# Patient Record
Sex: Female | Born: 1970
Health system: Southern US, Community
[De-identification: ages and names within clinical notes are randomized; demographics above are authoritative.]

## PROBLEM LIST (undated history)

## (undated) DIAGNOSIS — E119 Type 2 diabetes mellitus without complications: Secondary | ICD-10-CM

## (undated) DIAGNOSIS — I10 Essential (primary) hypertension: Secondary | ICD-10-CM

## (undated) DIAGNOSIS — E7800 Pure hypercholesterolemia, unspecified: Secondary | ICD-10-CM

## (undated) HISTORY — DX: Type 2 diabetes mellitus without complications: E11.9

---

## 1997-08-01 ENCOUNTER — Emergency Department (HOSPITAL_COMMUNITY): Admission: EM | Admit: 1997-08-01 | Discharge: 1997-08-01 | Payer: Self-pay | Admitting: Emergency Medicine

## 1999-01-08 ENCOUNTER — Emergency Department (HOSPITAL_COMMUNITY): Admission: EM | Admit: 1999-01-08 | Discharge: 1999-01-08 | Payer: Self-pay | Admitting: Emergency Medicine

## 2000-04-17 ENCOUNTER — Encounter: Payer: Self-pay | Admitting: Emergency Medicine

## 2000-04-17 ENCOUNTER — Emergency Department (HOSPITAL_COMMUNITY): Admission: EM | Admit: 2000-04-17 | Discharge: 2000-04-17 | Payer: Self-pay | Admitting: Emergency Medicine

## 2000-07-10 ENCOUNTER — Emergency Department (HOSPITAL_COMMUNITY): Admission: EM | Admit: 2000-07-10 | Discharge: 2000-07-10 | Payer: Self-pay | Admitting: Emergency Medicine

## 2000-07-10 ENCOUNTER — Encounter: Payer: Self-pay | Admitting: Emergency Medicine

## 2000-07-29 ENCOUNTER — Emergency Department (HOSPITAL_COMMUNITY): Admission: EM | Admit: 2000-07-29 | Discharge: 2000-07-29 | Payer: Self-pay | Admitting: Emergency Medicine

## 2002-01-02 ENCOUNTER — Encounter: Payer: Self-pay | Admitting: Emergency Medicine

## 2002-01-02 ENCOUNTER — Emergency Department (HOSPITAL_COMMUNITY): Admission: EM | Admit: 2002-01-02 | Discharge: 2002-01-02 | Payer: Self-pay | Admitting: Emergency Medicine

## 2002-01-10 ENCOUNTER — Emergency Department (HOSPITAL_COMMUNITY): Admission: EM | Admit: 2002-01-10 | Discharge: 2002-01-11 | Payer: Self-pay | Admitting: Emergency Medicine

## 2002-10-08 ENCOUNTER — Emergency Department (HOSPITAL_COMMUNITY): Admission: EM | Admit: 2002-10-08 | Discharge: 2002-10-08 | Payer: Self-pay | Admitting: *Deleted

## 2002-12-25 ENCOUNTER — Emergency Department (HOSPITAL_COMMUNITY): Admission: AD | Admit: 2002-12-25 | Discharge: 2002-12-25 | Payer: Self-pay | Admitting: Family Medicine

## 2011-01-05 ENCOUNTER — Emergency Department (HOSPITAL_COMMUNITY)
Admission: EM | Admit: 2011-01-05 | Discharge: 2011-01-05 | Disposition: A | Discharge status: Home / Self Care | Payer: Self-pay | Attending: Emergency Medicine | Admitting: Emergency Medicine

## 2011-01-05 ENCOUNTER — Encounter: Payer: Self-pay | Admitting: *Deleted

## 2011-01-05 DIAGNOSIS — F17200 Nicotine dependence, unspecified, uncomplicated: Secondary | ICD-10-CM | POA: Insufficient documentation

## 2011-01-05 DIAGNOSIS — IMO0002 Reserved for concepts with insufficient information to code with codable children: Secondary | ICD-10-CM | POA: Insufficient documentation

## 2011-01-05 DIAGNOSIS — L0291 Cutaneous abscess, unspecified: Secondary | ICD-10-CM

## 2011-01-05 MED ORDER — DOXYCYCLINE HYCLATE 100 MG PO CAPS: 100.0000 mg | ORAL_CAPSULE | Freq: Two times a day (BID) | ORAL | Status: AC

## 2011-01-05 MED ORDER — OXYCODONE-ACETAMINOPHEN 5-325 MG PO TABS
2.0000 | ORAL_TABLET | Freq: Once | ORAL | Status: AC
  Administered 2011-01-05: 2 via ORAL
  Filled 2011-01-05: qty 2

## 2011-01-05 MED ORDER — LIDOCAINE HCL 2 % IJ SOLN
INTRAMUSCULAR | Status: AC
  Administered 2011-01-05: 20 mg
  Filled 2011-01-05: qty 1

## 2011-01-05 MED ORDER — LIDOCAINE HCL (CARDIAC) 20 MG/ML IV SOLN: 0.5000 mg/kg | Freq: Once | INTRAVENOUS | Status: DC

## 2011-01-05 MED ORDER — OXYCODONE-ACETAMINOPHEN 5-325 MG PO TABS: 1.0000 | ORAL_TABLET | Freq: Four times a day (QID) | ORAL | Status: AC | PRN

## 2011-01-05 NOTE — Discharge Planning (Signed)
ED CM contacted by Triage RN to assist with indigent medications.  Confirmed pt eligible for indigent medication services with Mr Mendel Cor[facility] in [facility]. Rx for Doxycycline tubed to [facility] pharmacy for processing with request to return call to Triage RN when ready.  CM spoke with pt and female at bedside about annual indigent medication assistance, local self pay pcps for follow up and provided with written resources for [employer].  Answered questions about low cost medication at [facility].  Informed her [facility] indigent program does not provide assist with narcotics -Rx for percocet from PA.  Pt states she will also try Aleve or Tylenol for pain.  Referred her to [facility]. Pt requesting PA incise abscess under her arm. Informed PA of pt request.

## 2011-01-05 NOTE — ED Provider Notes (Signed)
Medical screening examination/treatment/procedure(s) were performed by non-physician practitioner and as supervising physician I was immediately available for consultation/collaboration.   Laray Anger, DO 01/05/11 2007

## 2011-01-05 NOTE — ED Notes (Signed)
Pt states "I have these knots under my arm that have been there for about a month, they hurt"; pt presents with abceses to left axilla

## 2011-01-05 NOTE — ED Provider Notes (Signed)
History     CSN: [id number] Arrival date & time: 01/05/2011 10:16 AM   First MD Initiated Contact with Patient 01/05/11 1130      Chief Complaint  Patient presents with  . Cyst    (Consider location/radiation/quality/duration/timing/severity/associated sxs/prior treatment) HPI   Pt presents to the Emergency Department with complaints of abscesses under her arms. She is requesting surgery to have it removed because her boyfriend was taken to surgery for an abscess he had on his leg. She states that the bumps have been under her arms for about a month and that a new one has popped up which is really painful.  History reviewed. No pertinent past medical history.  History reviewed. No pertinent past surgical history.  No family history on file.  History  Substance Use Topics  . Smoking status: Current Everyday Smoker -- 0.5 packs/day  . Smokeless tobacco: Not on file  . Alcohol Use: 3.6 oz/week    6 Cans of beer per week     daily    OB History    Grav Para Term Preterm Abortions TAB SAB Ect Mult Living                  Review of Systems  All other systems reviewed and are negative.    Allergies  Review of patient's allergies indicates no known allergies.  Home Medications  No current outpatient prescriptions on file.  BP 138/90  Pulse 93  Temp(Src) 98.6 F (37 C) (Oral)  Resp 18  Ht 4\' 11"  (1.499 m)  Wt 150 lb (68.04 kg)  BMI 30.30 kg/m2  SpO2 100%  LMP 12/09/2010  Physical Exam  Constitutional: She appears well-developed and well-nourished.  HENT:  Head: Normocephalic and atraumatic.  Eyes: Conjunctivae are normal. Pupils are equal, round, and reactive to light.  Neck: Trachea normal, normal range of motion and full passive range of motion without pain. Neck supple.  Cardiovascular: Normal rate, regular rhythm and normal pulses.   Pulmonary/Chest: Effort normal and breath sounds normal. Chest wall is not dull to percussion. She exhibits no  tenderness, no crepitus, no edema, no deformity and no retraction.  Abdominal: Normal appearance.  Musculoskeletal: Normal range of motion.  Neurological: She is alert. She has normal strength.  Skin: Skin is warm, dry and intact.     Psychiatric: She has a normal mood and affect. Her speech is normal and behavior is normal. Judgment and thought content normal. Cognition and memory are normal.    ED Course  INCISION AND DRAINAGE Date/Time: 01/05/2011 12:53 PM Performed by: Dorthula Matas Authorized by: Dorthula Matas Consent: Verbal consent obtained. Risks and benefits: risks, benefits and alternatives were discussed Consent given by: patient Patient understanding: patient states understanding of the procedure being performed Patient consent: the patient's understanding of the procedure matches consent given Type: abscess Local anesthetic: lidocaine 1% without epinephrine Anesthetic total: 3 ml Patient sedated: no   (including critical care time)  Labs Reviewed - No data to display No results found.   No diagnosis found.    MDM  I informed the patient that an I&D would be the correct method to treat her abscesses and she refused to let me incise it, she states that she wants surgery to have the lumps removed. I told her I could refer her to surgery if she would like, but that abscesses are not emergent, I offered IandD again, antibiotics and pain medication and pt refuses. She wants surgery and said taht  she will be going to [facility] instead.  Before discharge pt changed her mind and let me I&D        Dorthula Matas, PA 01/05/11 1142  Dorthula Matas, [place] 01/05/11 1253

## 2011-03-04 ENCOUNTER — Emergency Department (HOSPITAL_COMMUNITY)
Admission: EM | Admit: 2011-03-04 | Discharge: 2011-03-05 | Disposition: A | Discharge status: Home / Self Care | Payer: Self-pay | Attending: Emergency Medicine | Admitting: Emergency Medicine

## 2011-03-04 ENCOUNTER — Encounter (HOSPITAL_COMMUNITY): Payer: Self-pay | Admitting: Emergency Medicine

## 2011-03-04 DIAGNOSIS — L02412 Cutaneous abscess of left axilla: Secondary | ICD-10-CM

## 2011-03-04 DIAGNOSIS — L0390 Cellulitis, unspecified: Secondary | ICD-10-CM

## 2011-03-04 DIAGNOSIS — IMO0002 Reserved for concepts with insufficient information to code with codable children: Secondary | ICD-10-CM | POA: Insufficient documentation

## 2011-03-04 DIAGNOSIS — M7989 Other specified soft tissue disorders: Secondary | ICD-10-CM | POA: Insufficient documentation

## 2011-03-04 DIAGNOSIS — M79609 Pain in unspecified limb: Secondary | ICD-10-CM | POA: Insufficient documentation

## 2011-03-04 MED ORDER — OXYCODONE-ACETAMINOPHEN 5-325 MG PO TABS
2.0000 | ORAL_TABLET | Freq: Once | ORAL | Status: AC
  Administered 2011-03-04: 2 via ORAL
  Filled 2011-03-04: qty 2

## 2011-03-04 MED ORDER — HYDROCODONE-ACETAMINOPHEN 7.5-325 MG/15ML PO SOLN: 15.0000 mL | ORAL | Status: AC | PRN

## 2011-03-04 MED ORDER — MINOCYCLINE HCL 100 MG PO CAPS: 100.0000 mg | ORAL_CAPSULE | Freq: Two times a day (BID) | ORAL | Status: AC

## 2011-03-04 NOTE — ED Provider Notes (Signed)
Medical screening examination/treatment/procedure(s) were performed by non-physician practitioner and as supervising physician I was immediately available for consultation/collaboration.   Dayton Bailiff, MD 03/04/11 [phone]

## 2011-03-04 NOTE — ED Notes (Signed)
Pt c/o abscess under left arm.  St's area is draining.

## 2011-03-04 NOTE — ED Notes (Signed)
C/o abscess under L arm x 1 month.

## 2011-03-04 NOTE — ED Notes (Signed)
Area of redness marked with skin marker.

## 2011-03-04 NOTE — ED Provider Notes (Signed)
History     CSN: [id number]  Arrival date & time 03/04/11  8657   First MD Initiated Contact with Patient 03/04/11 2017      Chief Complaint  Patient presents with  . Abscess     Patient is a 41 y.o. female presenting with abscess. The history is provided by the patient.  Abscess  This is a recurrent problem. The current episode started more than one week ago. The onset was gradual. The problem has been gradually worsening. The problem is moderate. The abscess is characterized by redness, painfulness, draining and swelling. The abscess first occurred at home. Pertinent negatives include no fever. She has received no recent medical care.  Patient reports approximately one month history of abscess to left axilla. States that she has been trying to self treat at home and noted within the last 24 hours at the abscess has actually started to drain. States abscess and surrounding area very painful and tender to palpation. Has history of abscesses in the same location.  History reviewed. No pertinent past medical history.  History reviewed. No pertinent past surgical history.  No family history on file.  History  Substance Use Topics  . Smoking status: Current Everyday Smoker -- 0.5 packs/day  . Smokeless tobacco: Not on file  . Alcohol Use: 3.6 oz/week    6 Cans of beer per week     daily    OB History    Grav Para Term Preterm Abortions TAB SAB Ect Mult Living                  Review of Systems  Constitutional: Negative.  Negative for fever.  HENT: Negative.   Eyes: Negative.   Respiratory: Negative.   Cardiovascular: Negative.   Gastrointestinal: Negative.   Genitourinary: Negative.   Musculoskeletal: Negative.   Skin: Negative.   Neurological: Negative.   Hematological: Negative.   Psychiatric/Behavioral: Negative.     Allergies  Review of patient's allergies indicates no known allergies.  Home Medications   Current Outpatient Rx  Name Route Sig Dispense  Refill  . NAPROXEN SODIUM 220 MG PO TABS Oral Take 220-440 mg by mouth 2 (two) times daily as needed. For pain.    Marland Kitchen MINOCYCLINE HCL 100 MG PO CAPS Oral Take 1 capsule (100 mg total) by mouth 2 (two) times daily. 20 capsule 0    BP 142/79  Pulse 96  Temp(Src) 98.2 F (36.8 C) (Oral)  Resp 18  SpO2 99%  LMP 03/02/2011  Physical Exam  Constitutional: She is oriented to person, place, and time. She appears well-developed and well-nourished.  HENT:  Head: Normocephalic and atraumatic.  Eyes: Conjunctivae are normal.  Cardiovascular: Normal rate.   Pulmonary/Chest: Effort normal.  Musculoskeletal: Normal range of motion.  Neurological: She is alert and oriented to person, place, and time.  Skin: Skin is warm and dry. No erythema.       Approx 3 cm raised, erythematous nodule to (L) axilla c/w abscess. Abscess currently draining purulent drainage. There is erythema and mild swelling that extends from the abscess up the posterior aspect of the (L) arm proximal to posterior elbow. Margins have been marked for re-assessment at f/u visit.  Psychiatric: She has a normal mood and affect.    ED Course  INCISION AND DRAINAGE Date/Time: 03/04/2011 9:30 PM Performed by: Leanne Chang Authorized by: Leanne Chang Consent: Verbal consent obtained. Risks and benefits: risks, benefits and alternatives were discussed Consent given by: patient Patient understanding:  patient states understanding of the procedure being performed Required items: required blood products, implants, devices, and special equipment available Patient identity confirmed: verbally with patient and arm band Type: abscess Body area: upper extremity ((L) axilla) Anesthesia: local infiltration Local anesthetic: lidocaine 1% without epinephrine Anesthetic total: 2.5 ml Patient sedated: no Scalpel size: 11 Incision type: single straight Complexity: simple Drainage: purulent and serous Drainage amount:  moderate Wound treatment: wound left open Packing material: 1/4 in iodoform gauze Patient tolerance: Patient tolerated the procedure well with no immediate complications. Comments: Bulky dressing applied   Findings, clinical impression and discharge plan discussed with patient. Will plan to discharge home with instructions to do warm soaks several times a day and to follow up at [facility] in 2 days. Will place patient on Minocin 100 mg twice a day for 10 days (Doxy on back order). Pt agreeable w/ plan. Labs Reviewed - No data to display No results found.   No diagnosis found.    MDM  HPI/PE and clinical findings c/w (L) axilla abscess w/ mild to mod local cellulitis. I&D w/ packing left in place and abx for d/c        Leanne Chang, NP 03/04/11 2304

## 2011-03-06 ENCOUNTER — Encounter (HOSPITAL_COMMUNITY): Payer: Self-pay | Admitting: Emergency Medicine

## 2011-03-06 ENCOUNTER — Emergency Department (INDEPENDENT_AMBULATORY_CARE_PROVIDER_SITE_OTHER)
Admission: EM | Admit: 2011-03-06 | Discharge: 2011-03-06 | Disposition: A | Discharge status: Home / Self Care | Payer: Self-pay | Source: Home / Self Care | Attending: Family Medicine | Admitting: Family Medicine

## 2011-03-06 DIAGNOSIS — Z5189 Encounter for other specified aftercare: Secondary | ICD-10-CM

## 2011-03-06 NOTE — ED Notes (Signed)
Patient in department for a wound recheck.  Patient has guaze under left axilla.  Patient reports some improvement in pain.

## 2011-03-06 NOTE — ED Provider Notes (Signed)
History     CSN: [id number]  Arrival date & time 03/06/11  1500   First MD Initiated Contact with Patient 03/06/11 1624      Chief Complaint  Patient presents with  . Wound Check    (Consider location/radiation/quality/duration/timing/severity/associated sxs/prior treatment) Patient is a 41 y.o. female presenting with wound check. The history is provided by the patient.  Wound Check  She was treated in the ED 2 to 3 days ago. Previous treatment in the ED includes I&D of abscess. Treatments since wound repair include oral antibiotics. There has been no drainage from the wound. The redness has improved. The swelling has improved. The pain has improved.    History reviewed. No pertinent past medical history.  History reviewed. No pertinent past surgical history.  No family history on file.  History  Substance Use Topics  . Smoking status: Current Everyday Smoker -- 0.5 packs/day  . Smokeless tobacco: Not on file  . Alcohol Use: 3.6 oz/week    6 Cans of beer per week     daily    OB History    Grav Para Term Preterm Abortions TAB SAB Ect Mult Living                  Review of Systems  Constitutional: Negative.   Skin: Positive for wound.    Allergies  Review of patient's allergies indicates no known allergies.  Home Medications   Current Outpatient Rx  Name Route Sig Dispense Refill  . HYDROCODONE-ACETAMINOPHEN 7.5-325 MG/15ML PO SOLN Oral Take 15 mLs by mouth every 4 (four) hours as needed for pain. 50 mL 0  . MINOCYCLINE HCL 100 MG PO CAPS Oral Take 1 capsule (100 mg total) by mouth 2 (two) times daily. 20 capsule 0  . NAPROXEN SODIUM 220 MG PO TABS Oral Take 220-440 mg by mouth 2 (two) times daily as needed. For pain.      BP 152/94  Pulse 90  Temp(Src) 98.6 F (37 C) (Oral)  Resp 18  SpO2 99%  LMP 03/02/2011  Physical Exam  Nursing note and vitals reviewed. Constitutional: She appears well-developed and well-nourished.  Skin: Skin is warm and dry.         ED Course  Procedures (including critical care time)  Labs Reviewed - No data to display No results found.   1. Wound check, abscess       MDM  Packing removed.        Barkley Bruns, MD 03/09/11 [phone]

## 2011-11-01 ENCOUNTER — Emergency Department (HOSPITAL_COMMUNITY)
Admission: EM | Admit: 2011-11-01 | Discharge: 2011-11-01 | Disposition: A | Discharge status: Home / Self Care | Payer: Self-pay | Attending: Emergency Medicine | Admitting: Emergency Medicine

## 2011-11-01 ENCOUNTER — Encounter (HOSPITAL_COMMUNITY): Payer: Self-pay | Admitting: *Deleted

## 2011-11-01 DIAGNOSIS — K029 Dental caries, unspecified: Secondary | ICD-10-CM | POA: Insufficient documentation

## 2011-11-01 DIAGNOSIS — K0889 Other specified disorders of teeth and supporting structures: Secondary | ICD-10-CM

## 2011-11-01 DIAGNOSIS — F17200 Nicotine dependence, unspecified, uncomplicated: Secondary | ICD-10-CM | POA: Insufficient documentation

## 2011-11-01 MED ORDER — AMOXICILLIN 500 MG PO CAPS
500.0000 mg | ORAL_CAPSULE | Freq: Once | ORAL | Status: AC
  Administered 2011-11-01: 500 mg via ORAL
  Filled 2011-11-01: qty 1

## 2011-11-01 MED ORDER — ACETAMINOPHEN-CODEINE #3 300-30 MG PO TABS: 1.0000 | ORAL_TABLET | Freq: Four times a day (QID) | ORAL | Status: DC | PRN

## 2011-11-01 MED ORDER — AMOXICILLIN 500 MG PO CAPS: 500.0000 mg | ORAL_CAPSULE | Freq: Three times a day (TID) | ORAL | Status: DC

## 2011-11-01 MED ORDER — ACETAMINOPHEN-CODEINE #3 300-30 MG PO TABS
1.0000 | ORAL_TABLET | Freq: Once | ORAL | Status: AC
  Administered 2011-11-01: 1 via ORAL
  Filled 2011-11-01: qty 1

## 2011-11-01 NOTE — ED Notes (Signed)
Face swollen on right side.  Had dental pain last night.

## 2011-11-01 NOTE — ED Provider Notes (Signed)
History     CSN: [id number]  Arrival date & time 11/01/11  4098   First MD Initiated Contact with Patient 11/01/11 [phone]      Chief Complaint  Patient presents with  . Jaw Pain    (Consider location/radiation/quality/duration/timing/severity/associated sxs/prior treatment) HPI Comments: 41 y/o female presents with constant upper right dental pain x 3 days. Describes pain as sharp, radiating in her upper right jaw rated 8/10. She tried taking aleve without any relief. She has not been able to eat anything due to pain. Hurts to drink water. Denies difficulty swallowing, fever, chills, rashes. She does not remember what she was doing when the pain began. Does not have a dentist. Admits to having multiple caries in the past.  The history is provided by the patient.    History reviewed. No pertinent past medical history.  History reviewed. No pertinent past surgical history.  History reviewed. No pertinent family history.  History  Substance Use Topics  . Smoking status: Current Every Day Smoker -- 0.5 packs/day  . Smokeless tobacco: Not on file  . Alcohol Use: 3.6 oz/week    6 Cans of beer per week     daily    OB History    Grav Para Term Preterm Abortions TAB SAB Ect Mult Living                  Review of Systems  Constitutional: Negative for fever and chills.  HENT: Positive for facial swelling and dental problem. Negative for sore throat, mouth sores, trouble swallowing and neck pain.   Respiratory: Negative for shortness of breath.   Neurological: Positive for headaches.    Allergies  Review of patient's allergies indicates no known allergies.  Home Medications   Current Outpatient Rx  Name Route Sig Dispense Refill  . NAPROXEN SODIUM 220 MG PO TABS Oral Take 220-440 mg by mouth 2 (two) times daily as needed. For pain.      BP 153/102  Temp 98.7 F (37.1 C) (Oral)  Resp 20  SpO2 100%  LMP 10/22/2011  Physical Exam  Constitutional: She is oriented to  person, place, and time. She appears well-developed and well-nourished. No distress.  HENT:  Head: Atraumatic. No trismus in the jaw.  Nose: Nose normal.  Mouth/Throat: Oropharynx is clear and moist and mucous membranes are normal. No oral lesions. Abnormal dentition. Dental caries present. No dental abscesses.       Multiple missing teeth. Only one upper right molar present with pulp exposed. Tender to palpation. No surrounding erythema or edema. Right side of face mildly swollen.  Eyes: Conjunctivae normal are normal.  Neck: Normal range of motion. Neck supple.  Cardiovascular: Normal rate and normal heart sounds.   Pulmonary/Chest: Effort normal and breath sounds normal.  Lymphadenopathy:       Head (right side): No submental, no submandibular and no tonsillar adenopathy present.       Head (left side): No submental, no submandibular and no tonsillar adenopathy present.    She has no cervical adenopathy.  Neurological: She is alert and oriented to person, place, and time.  Skin: Skin is warm and dry. No rash noted. She is not diaphoretic. No erythema.  Psychiatric: She has a normal mood and affect. Her behavior is normal.    ED Course  Procedures (including critical care time)  Labs Reviewed - No data to display No results found.   1. Dental caries into pulp   2. Pain, dental  MDM  41 y/o female with dental pain from pulp exposure. Will place on abx and provide pain control. Discussed f/u with dentist. Advised to call within 48 hours. Return precautions discussed.        Trevor Mace, PA-C 11/01/11 1007

## 2011-11-01 NOTE — ED Notes (Signed)
Charge Nurse speaking with patient and will speak with case manger for assistance of filling prescription for medication.

## 2011-11-01 NOTE — Progress Notes (Signed)
   CARE MANAGEMENT ED NOTE 11/01/2011  Patient:  Stephanie [name], Stephanie [name]   Account Number:  [id number]  Date Initiated:  11/01/2011  Documentation initiated by:  Fransico Michael  Subjective/Objective Assessment:     Subjective/Objective Assessment Detail:   patient requesting assistance with antibiotics     Action/Plan:   main pharmacy called   Action/Plan Detail:   Anticipated DC Date:  11/01/2011     Status Recommendation to Physician:   Result of Recommendation:      DC Planning Services  CM consult  Medication Assistance    Choice offered to / List presented to:            Status of service:  Completed, signed off  ED Comments:   ED Comments Detail:  11/01/11-1027-J.Alaura Schippers,RN,BSN [phone]      Patient requesting assistance with medication. To be discharged home on antibiotics. Spoke with main pharmacy, Victorino Dike, regarding elligibility for ZZ fund. Patient is not elligible for ZZ fund assistance. Last used in January 2013. RN in triage notified. Amoxicillin available on $4 list at [facility]. No further needs identified.

## 2011-11-02 NOTE — ED Provider Notes (Signed)
Medical screening examination/treatment/procedure(s) were performed by non-physician practitioner and as supervising physician I was immediately available for consultation/collaboration.  Marwan T Powers, MD 11/02/11 0713 

## 2012-06-17 ENCOUNTER — Emergency Department (HOSPITAL_COMMUNITY)
Admission: EM | Admit: 2012-06-17 | Discharge: 2012-06-17 | Disposition: A | Discharge status: Home / Self Care | Payer: Self-pay | Attending: Emergency Medicine | Admitting: Emergency Medicine

## 2012-06-17 ENCOUNTER — Encounter (HOSPITAL_COMMUNITY): Payer: Self-pay | Admitting: Emergency Medicine

## 2012-06-17 DIAGNOSIS — T310 Burns involving less than 10% of body surface: Secondary | ICD-10-CM | POA: Insufficient documentation

## 2012-06-17 DIAGNOSIS — T24019A Burn of unspecified degree of unspecified thigh, initial encounter: Secondary | ICD-10-CM | POA: Insufficient documentation

## 2012-06-17 DIAGNOSIS — IMO0002 Reserved for concepts with insufficient information to code with codable children: Secondary | ICD-10-CM | POA: Insufficient documentation

## 2012-06-17 DIAGNOSIS — F17200 Nicotine dependence, unspecified, uncomplicated: Secondary | ICD-10-CM | POA: Insufficient documentation

## 2012-06-17 DIAGNOSIS — T300 Burn of unspecified body region, unspecified degree: Secondary | ICD-10-CM

## 2012-06-17 DIAGNOSIS — R5381 Other malaise: Secondary | ICD-10-CM | POA: Insufficient documentation

## 2012-06-17 DIAGNOSIS — Y929 Unspecified place or not applicable: Secondary | ICD-10-CM | POA: Insufficient documentation

## 2012-06-17 DIAGNOSIS — Y939 Activity, unspecified: Secondary | ICD-10-CM | POA: Insufficient documentation

## 2012-06-17 DIAGNOSIS — X58XXXA Exposure to other specified factors, initial encounter: Secondary | ICD-10-CM | POA: Insufficient documentation

## 2012-06-17 DIAGNOSIS — L0291 Cutaneous abscess, unspecified: Secondary | ICD-10-CM

## 2012-06-17 MED ORDER — HYDROCODONE-ACETAMINOPHEN 5-325 MG PO TABS
1.0000 | ORAL_TABLET | Freq: Once | ORAL | Status: AC
  Administered 2012-06-17: 1 via ORAL
  Filled 2012-06-17: qty 1

## 2012-06-17 MED ORDER — HYDROCODONE-ACETAMINOPHEN 5-325 MG PO TABS: ORAL_TABLET | ORAL | Status: DC

## 2012-06-17 MED ORDER — SULFAMETHOXAZOLE-TRIMETHOPRIM 800-160 MG PO TABS: 1.0000 | ORAL_TABLET | Freq: Two times a day (BID) | ORAL | Status: DC

## 2012-06-17 NOTE — ED Notes (Signed)
Pt c/o abscess in axillary area chronic in nature; pt sts burn to upper left leg x several days ago not healing well

## 2012-06-17 NOTE — ED Provider Notes (Signed)
History     CSN: [id number]  Arrival date & time 06/17/12  1015   First MD Initiated Contact with Patient 06/17/12 1033      Chief Complaint  Patient presents with  . Abscess    (Consider location/radiation/quality/duration/timing/severity/associated sxs/prior treatment) HPI Comments: Patient with chronic axillary abscesses presents with complaint of abscess in right axilla for the past several days. It has not been draining. Patient complains of generalized malaise. She also complains of burn to left anterior thigh that she sustained approximately one week ago. She's been applying Vaseline which helps. Patient denies fever, nausea or vomiting. Onset of symptoms gradual.  Patient is a 42 y.o. female presenting with abscess. The history is provided by the patient.  Abscess Associated symptoms: no fever, no nausea and no vomiting     History reviewed. No pertinent past medical history.  History reviewed. No pertinent past surgical history.  History reviewed. No pertinent family history.  History  Substance Use Topics  . Smoking status: Current Every Day Smoker -- 0.50 packs/day  . Smokeless tobacco: Not on file  . Alcohol Use: 3.6 oz/week    6 Cans of beer per week     Comment: daily    OB History   Grav Para Term Preterm Abortions TAB SAB Ect Mult Living                  Review of Systems  Constitutional: Negative for fever.  Gastrointestinal: Negative for nausea and vomiting.  Skin: Positive for color change and wound.       Positive for abscess.  Hematological: Negative for adenopathy.    Allergies  Review of patient's allergies indicates no known allergies.  Home Medications   Current Outpatient Rx  Name  Route  Sig  Dispense  Refill  . HYDROcodone-acetaminophen (NORCO/VICODIN) 5-325 MG per tablet      Take 1-2 tablets every 6 hours as needed for severe pain   8 tablet   0   . sulfamethoxazole-trimethoprim (SEPTRA DS) 800-160 MG per tablet   Oral  Take 1 tablet by mouth every 12 (twelve) hours.   14 tablet   0     BP 147/85  Pulse 89  Temp(Src) 98.4 F (36.9 C) (Oral)  Resp 20  SpO2 99%  Physical Exam  Nursing note and vitals reviewed. Constitutional: She appears well-developed and well-nourished.  HENT:  Head: Normocephalic and atraumatic.  Eyes: Conjunctivae are normal.  Neck: Normal range of motion. Neck supple.  Pulmonary/Chest: No respiratory distress.  Neurological: She is alert.  Skin: Skin is warm and dry.  Small, firm, 1cm diameter abscess to right axilla without surrounding cellulitis.  2 cm circular diameter well-healing burn to left anterior thigh. No drainage.  Psychiatric: She has a normal mood and affect.    ED Course  Procedures (including critical care time)  Labs Reviewed - No data to display No results found.   1. Abscess   2. Burn    11:16 AM Patient seen and examined. Patient counseled on wound care. Patient refuses I&D of axillary abscess because 'it will hurt too much and I don't want to go through that pain". I explained the risks and benefits of I&D.    Vital signs reviewed and are as follows: Filed Vitals:   06/17/12 1025  BP: 147/85  Pulse: 89  Temp: 98.4 F (36.9 C)  Resp: 20   The patient was urged to return to the Emergency Department urgently with worsening pain, swelling, expanding  erythema especially if it streaks away from the affected area, fever, or if they have any other concerns.   The patient was urged to return to the Emergency Department or go to their PCP in 48 hours for wound recheck if the area is not significantly improved.  The patient verbalized understanding and stated agreement with this plan.   Patient counseled on use of narcotic pain medications. Counseled not to combine these medications with others containing tylenol. Urged not to drink alcohol, drive, or perform any other activities that requires focus while taking these medications. The patient  verbalizes understanding and agrees with the plan.     MDM  Well healing burn. Patient to continue wound care. Pain control given.  Abscess. Minimally fluctuant. Patient refuses I&D. Will give course of antibiotics as a trial. Patient encouraged to continue warm compresses. No surrounding cellulitis or systemic symptoms.      Renne Crigler, PA-C 06/17/12 1121

## 2012-06-17 NOTE — ED Provider Notes (Signed)
Medical screening examination/treatment/procedure(s) were performed by non-physician practitioner and as supervising physician I was immediately available for consultation/collaboration.  Flint Melter, MD 06/17/12 [phone]

## 2013-07-13 ENCOUNTER — Ambulatory Visit: Payer: Self-pay

## 2015-10-25 ENCOUNTER — Emergency Department (HOSPITAL_COMMUNITY): Payer: Self-pay

## 2015-10-25 ENCOUNTER — Encounter (HOSPITAL_COMMUNITY): Payer: Self-pay

## 2015-10-25 DIAGNOSIS — F17200 Nicotine dependence, unspecified, uncomplicated: Secondary | ICD-10-CM | POA: Insufficient documentation

## 2015-10-25 DIAGNOSIS — I10 Essential (primary) hypertension: Secondary | ICD-10-CM | POA: Insufficient documentation

## 2015-10-25 DIAGNOSIS — L02412 Cutaneous abscess of left axilla: Secondary | ICD-10-CM | POA: Insufficient documentation

## 2015-10-25 DIAGNOSIS — J209 Acute bronchitis, unspecified: Secondary | ICD-10-CM | POA: Insufficient documentation

## 2015-10-25 LAB — CBC
HCT: 35.6 % — ABNORMAL LOW (ref 36.0–46.0)
HEMOGLOBIN: 10.8 g/dL — AB (ref 12.0–15.0)
MCH: 21 pg — AB (ref 26.0–34.0)
MCHC: 30.3 g/dL (ref 30.0–36.0)
MCV: 69.3 fL — AB (ref 78.0–100.0)
Platelets: 328 10*3/uL (ref 150–400)
RBC: 5.14 MIL/uL — AB (ref 3.87–5.11)
RDW: 19.3 % — ABNORMAL HIGH (ref 11.5–15.5)
WBC: 11.5 10*3/uL — ABNORMAL HIGH (ref 4.0–10.5)

## 2015-10-25 LAB — BASIC METABOLIC PANEL
Anion gap: 10 (ref 5–15)
BUN: 11 mg/dL (ref 6–20)
CALCIUM: 9.1 mg/dL (ref 8.9–10.3)
CHLORIDE: 106 mmol/L (ref 101–111)
CO2: 20 mmol/L — AB (ref 22–32)
CREATININE: 1.02 mg/dL — AB (ref 0.44–1.00)
GFR calc non Af Amer: >60 mL/min (ref >60)
GLUCOSE: 248 mg/dL — AB (ref 65–99)
Potassium: 4 mmol/L (ref 3.5–5.1)
Sodium: 136 mmol/L (ref 135–145)

## 2015-10-25 LAB — I-STAT TROPONIN, ED: Troponin i, poc: 0 ng/mL (ref 0.00–0.08)

## 2015-10-25 NOTE — ED Triage Notes (Signed)
Pt states for the past 3-4 days she has had chest congestion and pains without coughing up any phlegm. CP radiates to back. Denies n/v, some SOB.

## 2015-10-25 NOTE — ED Notes (Signed)
Called x 2 for triage, no answer 

## 2015-10-26 ENCOUNTER — Emergency Department (HOSPITAL_COMMUNITY)
Admission: EM | Admit: 2015-10-26 | Discharge: 2015-10-26 | Disposition: A | Discharge status: Home / Self Care | Payer: Self-pay | Attending: Emergency Medicine | Admitting: Emergency Medicine

## 2015-10-26 DIAGNOSIS — L02419 Cutaneous abscess of limb, unspecified: Secondary | ICD-10-CM

## 2015-10-26 DIAGNOSIS — J209 Acute bronchitis, unspecified: Secondary | ICD-10-CM

## 2015-10-26 HISTORY — DX: Essential (primary) hypertension: I10

## 2015-10-26 MED ORDER — ALBUTEROL SULFATE HFA 108 (90 BASE) MCG/ACT IN AERS
2.0000 | INHALATION_SPRAY | RESPIRATORY_TRACT | Status: DC | PRN
  Administered 2015-10-26: 2 via RESPIRATORY_TRACT
  Filled 2015-10-26: qty 6.7

## 2015-10-26 MED ORDER — DOXYCYCLINE HYCLATE 100 MG PO CAPS: 100.0000 mg | ORAL_CAPSULE | Freq: Two times a day (BID) | ORAL | 0 refills | Status: DC

## 2015-10-26 NOTE — ED Provider Notes (Signed)
[facility]-EMERGENCY DEPT Provider Note   CSN: [id number] Arrival date & time: 10/25/15  2025  By signing my name below, I, Clovis Pu, attest that this documentation has been prepared under the direction and in the presence of Geoffery Lyons, MD  Electronically Signed: Clovis Pu, ED Scribe. 10/26/15. 1:18 AM.   History   Chief Complaint Chief Complaint  Patient presents with  . Chest Pain  . Cough    The history is provided by the patient. No language interpreter was used.  Chest Pain   This is a new problem. The current episode started more than 2 days ago. The problem occurs constantly. The problem has not changed since onset.The pain is moderate. The pain radiates to the upper back. Associated symptoms include cough. She has tried nothing for the symptoms.  Cough  This is a new problem. The current episode started more than 2 days ago. The problem occurs constantly. The problem has not changed since onset.The cough is non-productive. There has been no fever. Associated symptoms include chest pain. She has tried nothing for the symptoms. She is a smoker.   HPI Comments:  Stephanie [name] is a 45 y.o. female who presents to the Emergency Department complaining of constant, moderate chest pain onset 3 days ago. Pt states the chest pain radiates to her back. She notes an associated cough, chest congestion and diaphoresis. Pt is a smoker. She denies any sick contact, pain or swelling to her BLE, and similar symptoms in the past. She states she has not been able to continue her daily activities due to her pain. Pt also present to the ED complaining of a lump under her left armpit. She notes the lump is sore. No alleviating factors noted.    Past Medical History:  Diagnosis Date  . Hypertension     There are no active problems to display for this patient.   History reviewed. No pertinent surgical history.  OB History    No data available       Home Medications    Prior to  Admission medications   Medication Sig Start Date End Date Taking? Authorizing Provider  HYDROcodone-acetaminophen (NORCO/VICODIN) 5-325 MG per tablet Take 1-2 tablets every 6 hours as needed for severe pain 06/17/12   Renne Crigler, PA-C  sulfamethoxazole-trimethoprim (SEPTRA DS) 800-160 MG per tablet Take 1 tablet by mouth every 12 (twelve) hours. 06/17/12   Renne Crigler, PA-C    Family History No family history on file.  Social History Social History  Substance Use Topics  . Smoking status: Current Every Day Smoker    Packs/day: 0.50  . Smokeless tobacco: Never Used  . Alcohol use 3.6 oz/week    6 Cans of beer per week     Comment: daily     Allergies   Review of patient's allergies indicates no known allergies.   Review of Systems Review of Systems  Respiratory: Positive for cough.   Cardiovascular: Positive for chest pain.  All other systems reviewed and are negative.    Physical Exam Updated Vital Signs BP 165/98 (BP Location: Left Arm)   Pulse 106   Temp 98.2 F (36.8 C) (Oral)   Resp 20   Ht 4\' 11"  (1.499 m)   Wt 174 lb (78.9 kg)   LMP 10/10/2015   SpO2 100%   BMI 35.14 kg/m   Physical Exam  Constitutional: She is oriented to person, place, and time. She appears well-developed and well-nourished. No distress.  HENT:  Head: Normocephalic  and atraumatic.  Right Ear: Hearing normal.  Left Ear: Hearing normal.  Nose: Nose normal.  Mouth/Throat: Oropharynx is clear and moist and mucous membranes are normal.  Eyes: Conjunctivae and EOM are normal. Pupils are equal, round, and reactive to light.  Neck: Normal range of motion. Neck supple.  Cardiovascular: Regular rhythm, S1 normal and S2 normal.  Exam reveals no gallop and no friction rub.   No murmur heard. Pulmonary/Chest: Effort normal and breath sounds normal. No respiratory distress. She exhibits no tenderness.  Abdominal: Soft. Normal appearance and bowel sounds are normal. There is no  hepatosplenomegaly. There is no tenderness. There is no rebound, no guarding, no tenderness at McBurney's point and negative Murphy's sign. No hernia.  Musculoskeletal: Normal range of motion.  Neurological: She is alert and oriented to person, place, and time. She has normal strength. No cranial nerve deficit or sensory deficit. Coordination normal. GCS eye subscore is 4. GCS verbal subscore is 5. GCS motor subscore is 6.  Skin: Skin is warm, dry and intact. No rash noted. No cyanosis.  Several sub 1cm indurated area to left axilla. Mild erythema however no palpable fluctuance.   Psychiatric: She has a normal mood and affect. Her speech is normal and behavior is normal. Thought content normal.  Nursing note and vitals reviewed.    ED Treatments / Results  DIAGNOSTIC STUDIES:  Oxygen Saturation is 100% on RA, normal by my interpretation.    COORDINATION OF CARE:  1:09 AM Discussed treatment plan with pt at bedside and pt agreed to plan.  Labs (all labs ordered are listed, but only abnormal results are displayed) Labs Reviewed  BASIC METABOLIC PANEL - Abnormal; Notable for the following:       Result Value   CO2 20 (*)    Glucose, Bld 248 (*)    Creatinine, Ser 1.02 (*)    All other components within normal limits  CBC - Abnormal; Notable for the following:    WBC 11.5 (*)    RBC 5.14 (*)    Hemoglobin 10.8 (*)    HCT 35.6 (*)    MCV 69.3 (*)    MCH 21.0 (*)    RDW 19.3 (*)    All other components within normal limits  I-STAT TROPOININ, ED    EKG  EKG Interpretation None       Radiology Dg Chest 2 View  Result Date: 10/25/2015 CLINICAL DATA:  Mid back pain and mild shortness of breath for 4 days. History of hypertension, smoker. EXAM: CHEST  2 VIEW COMPARISON:  None. FINDINGS: Cardiomediastinal silhouette is normal. No pleural effusions or focal consolidations. Trachea projects midline and there is no pneumothorax. Soft tissue planes and included osseous structures  are non-suspicious. IMPRESSION: No acute cardiopulmonary process. Electronically Signed   By: Awilda Metro M.D.   On: 10/25/2015 21:37    Procedures Procedures (including critical care time)  Medications Ordered in ED Medications - No data to display   Initial Impression / Assessment and Plan / ED Course  I have reviewed the triage vital signs and the nursing notes.  Pertinent labs & imaging results that were available during my care of the patient were reviewed by me and considered in my medical decision making (see chart for details).  Clinical Course    Patient with small abscesses/folliculitis to the left axilla and upper respiratory symptoms which are likely viral. She will be treated with doxycycline for the folliculitis. This should also cover a bacterial bronchitis. To  return as needed for any problems.  Final Clinical Impressions(s) / ED Diagnoses   Final diagnoses:  None    New Prescriptions New Prescriptions   No medications on file  I personally performed the services described in this documentation, which was scribed in my presence. The recorded information has been reviewed and is accurate.        Geoffery Lyonsouglas Sedalia Greeson, MD 10/26/15 [phone]

## 2015-10-26 NOTE — Discharge Instructions (Signed)
Doxycycline as prescribed.  Albuterol inhaler: 2 puffs every 4 hours as needed for wheezing.  Ibuprofen 600 mg every 6 hours as needed for pain.  Apply warm soaks as frequently as possible to the axilla for the next several days.  Return to the emergency department if your symptoms significantly worsen or change.

## 2015-10-26 NOTE — ED Notes (Signed)
Pt provided with d/c instructions at this time. Pt verbalizes understanding of d/c instructions at this time. Pt provided with RX for doxycycline. Pt verbalizes understanding of RX directions.  Pt in no apparent distress at this time.  Pt ambulatory at time of d/c.

## 2017-11-18 ENCOUNTER — Encounter (HOSPITAL_COMMUNITY): Payer: Self-pay

## 2017-11-18 ENCOUNTER — Emergency Department (HOSPITAL_COMMUNITY)
Admission: EM | Admit: 2017-11-18 | Discharge: 2017-11-18 | Disposition: A | Discharge status: Home / Self Care | Payer: Self-pay | Attending: Emergency Medicine | Admitting: Emergency Medicine

## 2017-11-18 ENCOUNTER — Other Ambulatory Visit: Payer: Self-pay

## 2017-11-18 DIAGNOSIS — R030 Elevated blood-pressure reading, without diagnosis of hypertension: Secondary | ICD-10-CM | POA: Insufficient documentation

## 2017-11-18 DIAGNOSIS — N3000 Acute cystitis without hematuria: Secondary | ICD-10-CM | POA: Insufficient documentation

## 2017-11-18 DIAGNOSIS — I10 Essential (primary) hypertension: Secondary | ICD-10-CM

## 2017-11-18 DIAGNOSIS — N912 Amenorrhea, unspecified: Secondary | ICD-10-CM | POA: Insufficient documentation

## 2017-11-18 DIAGNOSIS — Z79899 Other long term (current) drug therapy: Secondary | ICD-10-CM | POA: Insufficient documentation

## 2017-11-18 LAB — COMPREHENSIVE METABOLIC PANEL
ALBUMIN: 3.7 g/dL (ref 3.5–5.0)
ALT: 23 U/L (ref 0–44)
ANION GAP: 9 (ref 5–15)
AST: 25 U/L (ref 15–41)
Alkaline Phosphatase: 78 U/L (ref 38–126)
BILIRUBIN TOTAL: 0.6 mg/dL (ref 0.3–1.2)
BUN: 12 mg/dL (ref 6–20)
CHLORIDE: 107 mmol/L (ref 98–111)
CO2: 26 mmol/L (ref 22–32)
Calcium: 9.1 mg/dL (ref 8.9–10.3)
Creatinine, Ser: 0.81 mg/dL (ref 0.44–1.00)
GFR calc Af Amer: >60 mL/min (ref >60)
GFR calc non Af Amer: >60 mL/min (ref >60)
GLUCOSE: 300 mg/dL — AB (ref 70–99)
POTASSIUM: 3.8 mmol/L (ref 3.5–5.1)
Sodium: 142 mmol/L (ref 135–145)
TOTAL PROTEIN: 7 g/dL (ref 6.5–8.1)

## 2017-11-18 LAB — URINALYSIS, ROUTINE W REFLEX MICROSCOPIC
Bilirubin Urine: NEGATIVE
Glucose, UA: >=500 mg/dL — AB
Hgb urine dipstick: NEGATIVE
Ketones, ur: NEGATIVE mg/dL
NITRITE: NEGATIVE
PH: 7 (ref 5.0–8.0)
Protein, ur: 100 mg/dL — AB
Specific Gravity, Urine: 1.022 (ref 1.005–1.030)
WBC, UA: >50 WBC/hpf — ABNORMAL HIGH (ref 0–5)

## 2017-11-18 LAB — CBC WITH DIFFERENTIAL/PLATELET
Abs Immature Granulocytes: 0.03 10*3/uL (ref 0.00–0.07)
BASOS ABS: 0 10*3/uL (ref 0.0–0.1)
Basophils Relative: 0 %
EOS PCT: 0 %
Eosinophils Absolute: 0 10*3/uL (ref 0.0–0.5)
HCT: 40.2 % (ref 36.0–46.0)
Hemoglobin: 13.2 g/dL (ref 12.0–15.0)
IMMATURE GRANULOCYTES: 0 %
Lymphocytes Relative: 29 %
Lymphs Abs: 2 10*3/uL (ref 0.7–4.0)
MCH: 26.4 pg (ref 26.0–34.0)
MCHC: 32.8 g/dL (ref 30.0–36.0)
MCV: 80.4 fL (ref 80.0–100.0)
Monocytes Absolute: 0.4 10*3/uL (ref 0.1–1.0)
Monocytes Relative: 5 %
NEUTROS PCT: 66 %
NRBC: 0.3 % — AB (ref 0.0–0.2)
Neutro Abs: 4.6 10*3/uL (ref 1.7–7.7)
PLATELETS: 244 10*3/uL (ref 150–400)
RBC: 5 MIL/uL (ref 3.87–5.11)
RDW: 18.6 % — ABNORMAL HIGH (ref 11.5–15.5)
WBC: 7.1 10*3/uL (ref 4.0–10.5)

## 2017-11-18 LAB — TSH: TSH: 1.299 u[IU]/mL (ref 0.350–4.500)

## 2017-11-18 LAB — POC URINE PREG, ED: Preg Test, Ur: NEGATIVE

## 2017-11-18 LAB — LIPASE, BLOOD: Lipase: 34 U/L (ref 11–51)

## 2017-11-18 LAB — T4, FREE: Free T4: 0.68 ng/dL — ABNORMAL LOW (ref 0.82–1.77)

## 2017-11-18 MED ORDER — LABETALOL HCL 5 MG/ML IV SOLN
20.0000 mg | Freq: Once | INTRAVENOUS | Status: AC
  Administered 2017-11-18: 20 mg via INTRAVENOUS
  Filled 2017-11-18: qty 4

## 2017-11-18 MED ORDER — CLONIDINE HCL 0.1 MG PO TABS
0.3000 mg | ORAL_TABLET | Freq: Once | ORAL | Status: AC
  Administered 2017-11-18: 0.3 mg via ORAL
  Filled 2017-11-18: qty 3

## 2017-11-18 MED ORDER — CLONIDINE HCL 0.2 MG PO TABS: 0.3000 mg | ORAL_TABLET | Freq: Two times a day (BID) | ORAL | 0 refills | Status: DC

## 2017-11-18 MED ORDER — SODIUM CHLORIDE 0.9 % IV SOLN
1.0000 g | Freq: Once | INTRAVENOUS | Status: DC
  Administered 2017-11-18: 1 g via INTRAVENOUS
  Filled 2017-11-18: qty 10

## 2017-11-18 MED ORDER — LISINOPRIL 10 MG PO TABS
10.0000 mg | ORAL_TABLET | Freq: Once | ORAL | Status: AC
  Administered 2017-11-18: 10 mg via ORAL
  Filled 2017-11-18: qty 1

## 2017-11-18 MED ORDER — FOSFOMYCIN TROMETHAMINE 3 G PO PACK
3.0000 g | PACK | Freq: Once | ORAL | Status: AC
  Administered 2017-11-18: 3 g via ORAL
  Filled 2017-11-18: qty 3

## 2017-11-18 MED ORDER — LABETALOL HCL 5 MG/ML IV SOLN
10.0000 mg | Freq: Once | INTRAVENOUS | Status: AC
  Administered 2017-11-18: 10 mg via INTRAVENOUS
  Filled 2017-11-18: qty 4

## 2017-11-18 MED ORDER — HYDROCHLOROTHIAZIDE 25 MG PO TABS: 25.0000 mg | ORAL_TABLET | Freq: Every day | ORAL | 0 refills | Status: DC

## 2017-11-18 NOTE — ED Notes (Signed)
RN made aware of elevated BP 

## 2017-11-18 NOTE — Discharge Instructions (Addendum)
You were seen in the ER for cessation of menstrual cycle.  We discussed this may be from hormonal changes approaching menopause.  Follow-up with OB/GYN for further discussion of this.  You have a urinary tract infection, this was treated in the ER and you do not need any more antibiotics.  Return for fever, chills, blood in your urine, abdominal pain, flank pain.  You were found to have very elevated blood pressure.  We controlled this in the ER with IV medications.  It is important to start taking her blood pressure medicines.  Take hydrochlorothiazide 25 mg daily and clonidine 0.3 mg twice a day.  Recheck your blood pressure frequently, goal is 150/90.  Return to the ER for severe headache, vision changes, stroke symptoms, difficulty with speech or walking.

## 2017-11-18 NOTE — ED Notes (Signed)
Provided patient a [place] sandwich and ginger ale. Also, provided patient some cream for her dry skin at her request.

## 2017-11-18 NOTE — ED Triage Notes (Signed)
Pt reports that she has not had a menstrual period in 5 months. Pt reports that she had nausea for "a while" "couple months"

## 2017-11-18 NOTE — ED Provider Notes (Addendum)
[facility]-EMERGENCY DEPT Provider Note   CSN: [id number] Arrival date & time: 11/18/17  1243     History   Chief Complaint Chief Complaint  Patient presents with  . Amenorrhea  . Nausea    HPI Stephanie [name] is a 47 y.o. female with history of untreated hypertension is here for evaluation of amenorrhea.  States she has not had her menstrual cycle for close to 6 months.  States she thinks she is going through "the phase".  Associate symptoms include nausea, lightheadedness, sweats, hot flashes, difficulty sleeping and double mood for 2 years.  Patient was found to be hypertensive in the ER with BP 210/120.  States that she has not taken blood pressure medicine for more than 20 years.  States every now and then she has a mild headache and lightheadedness but this is brief and self resolving.  She smokes 1 pack of cigarettes every 3 days and drinks 1 bottle of wine daily.    She denies associated vomiting, exertional chest pain or shortness of breath, leg or body swelling, cough, orthopnea, PND, abdominal pain,  changes in bowel movement, dysuria, HA, vision changes.  HPI  Past Medical History:  Diagnosis Date  . Hypertension     There are no active problems to display for this patient.   History reviewed. No pertinent surgical history.   OB History   None      Home Medications    Prior to Admission medications   Medication Sig Start Date End Date Taking? Authorizing Provider  cloNIDine (CATAPRES) 0.2 MG tablet Take 1.5 tablets (0.3 mg total) by mouth 2 (two) times daily. 11/18/17 12/18/17  Liberty Handy, PA-C  doxycycline (VIBRAMYCIN) 100 MG capsule Take 1 capsule (100 mg total) by mouth 2 (two) times daily. One po bid x 7 days Patient not taking: Reported on 11/18/2017 10/26/15   Geoffery Lyons, MD  hydrochlorothiazide (HYDRODIURIL) 25 MG tablet Take 1 tablet (25 mg total) by mouth daily. 11/18/17 12/18/17  Liberty Handy, PA-C    HYDROcodone-acetaminophen (NORCO/VICODIN) 5-325 MG per tablet Take 1-2 tablets every 6 hours as needed for severe pain Patient not taking: Reported on 11/18/2017 06/17/12   Renne Crigler, PA-C  sulfamethoxazole-trimethoprim (SEPTRA DS) 800-160 MG per tablet Take 1 tablet by mouth every 12 (twelve) hours. Patient not taking: Reported on 11/18/2017 06/17/12   Renne Crigler, PA-C    Family History History reviewed. No pertinent family history.  Social History Social History   Tobacco Use  . Smoking status: Current Every Day Smoker    Packs/day: 0.50  . Smokeless tobacco: Never Used  Substance Use Topics  . Alcohol use: Yes    Alcohol/week: 6.0 standard drinks    Types: 6 Cans of beer per week    Comment: daily  . Drug use: No     Allergies   Patient has no known allergies.   Review of Systems Review of Systems  All other systems reviewed and are negative.    Physical Exam Updated Vital Signs BP (!) 171/95 (BP Location: Left Arm)   Pulse 88   Temp 98 F (36.7 C) (Oral)   Resp 18   Ht 4\' 11"  (1.499 m)   Wt 70.3 kg   SpO2 99%   BMI 31.31 kg/m   Physical Exam  Constitutional: She is oriented to person, place, and time. She appears well-developed and well-nourished.  Non toxic  HENT:  Head: Normocephalic and atraumatic.  Nose: Nose normal.  Eyes:  Pupils are equal, round, and reactive to light. Conjunctivae and EOM are normal.  Neck: Normal range of motion.  Cardiovascular: Normal rate and regular rhythm.  Pulmonary/Chest: Effort normal and breath sounds normal.  Abdominal: Soft. Bowel sounds are normal. There is no tenderness.  No G/R/R. No suprapubic or CVA tenderness. Negative Murphy's and McBurney's. Active BS to lower quadrants.   Musculoskeletal: Normal range of motion.  Neurological: She is alert and oriented to person, place, and time.  Speech is fluent without obvious dysarthria or aphasia. Strength 5/5 in upper and lower extremities   Sensation to  light touch intact in bilateral face, upper and lower extremities No truncal sway.  Normal finger-to-nose.  CN II-XII grossly intact bilaterally.   Skin: Skin is warm and dry. Capillary refill takes less than 2 seconds.  Psychiatric: She has a normal mood and affect. Her behavior is normal.  Nursing note and vitals reviewed.    ED Treatments / Results  Labs (all labs ordered are listed, but only abnormal results are displayed) Labs Reviewed  CBC WITH DIFFERENTIAL/PLATELET - Abnormal; Notable for the following components:      Result Value   RDW 18.6 (*)    nRBC 0.3 (*)    All other components within normal limits  COMPREHENSIVE METABOLIC PANEL - Abnormal; Notable for the following components:   Glucose, Bld 300 (*)    All other components within normal limits  URINALYSIS, ROUTINE W REFLEX MICROSCOPIC - Abnormal; Notable for the following components:   APPearance HAZY (*)    Glucose, UA >=500 (*)    Protein, ur 100 (*)    Leukocytes, UA MODERATE (*)    WBC, UA >50 (*)    Bacteria, UA RARE (*)    All other components within normal limits  T4, FREE - Abnormal; Notable for the following components:   Free T4 0.68 (*)    All other components within normal limits  URINE CULTURE  LIPASE, BLOOD  TSH  POC URINE PREG, ED    EKG None  Radiology No results found.  Procedures .Critical Care Performed by: Liberty Handy, PA-C Authorized by: Liberty Handy, PA-C   Critical care provider statement:    Critical care time (minutes):  45   Critical care was necessary to treat or prevent imminent or life-threatening deterioration of the following conditions: hypertensive urgency requiring multiple rounds of IV antihypertensive medications.   Critical care was time spent personally by me on the following activities:  Discussions with consultants, evaluation of patient's response to treatment, examination of patient, ordering and performing treatments and interventions,  ordering and review of laboratory studies, ordering and review of radiographic studies, pulse oximetry, re-evaluation of patient's condition, obtaining history from patient or surrogate and review of old charts   (including critical care time)  Medications Ordered in ED Medications  labetalol (NORMODYNE,TRANDATE) injection 10 mg (10 mg Intravenous Given 11/18/17 1641)  labetalol (NORMODYNE,TRANDATE) injection 20 mg (20 mg Intravenous Given 11/18/17 1902)  lisinopril (PRINIVIL,ZESTRIL) tablet 10 mg (10 mg Oral Given 11/18/17 1901)  cloNIDine (CATAPRES) tablet 0.3 mg (0.3 mg Oral Given 11/18/17 2059)  fosfomycin (MONUROL) packet 3 g (3 g Oral Given 11/18/17 2120)     Initial Impression / Assessment and Plan / ED Course  I have reviewed the triage vital signs and the nursing notes.  Pertinent labs & imaging results that were available during my care of the patient were reviewed by me and considered in my medical decision making (see chart  for details).  Clinical Course as of Nov 20 14  Mon Nov 18, 2017  1632 BP(!): 208/120 [CG]  1632 BP(!): 209/106 [CG]  2014 Leukocytes, UA(!): MODERATE [CG]  2014 WBC, UA(!): >50 [CG]  2014 Bacteria, UA(!): RARE [CG]  2055 BP(!): 182/78 [CG]    Clinical Course User Index [CG] Liberty Handy, PA-C   47 year old here for amenorrhea for 6 months.  She thinks she is going through menopause.  She does report some symptoms that suggest this.  No other concerning features reported.  Pregnancy negative in the ER. Will have her f/u with OBGYN for further evaluation of this.   She was found to be hypertensive in the 210s/120s.  Admits to non compliance with antihypertensives for over 20 years.  She reports intermittent headaches, lightheadedness but none currently.  No reported features to suggest hypertensive crisis including thunderclap headache, seizure activity, stroke symptoms, vision changes, orthopnea, edema, back or abdominal pain.  Given marked BP  elevation, will obtain screening labs to check creatinine and give labetalol to control blood pressure.    0015: Creatinine WNL.  100 protein in the urine.  UA suggestive of UTI.  Patient required a couple of rounds of antihypertensives in the ER which ultimately decreased blood pressure 170/95.  She remained asymptomatic in the ER.  Patient reported difficulty affording medications so she was treated with fosfomycin for UTI.  Based on age and ethnicity, patient was discharged with HCTZ and clonidine for blood pressure.  Had lengthy discussion with patient regarding importance of adherence to antihypertensives to prevent endorgan damage.  Discussed specific return precautions that would warrant return to the ER for hypertensive emergency.  She is to follow-up with [facility] for further management of blood pressure.  Case discussed with Dr. Donnald Garre.    Final Clinical Impressions(s) / ED Diagnoses   Final diagnoses:  Amenorrhea  Elevated blood pressure reading in office with diagnosis of hypertension  Acute cystitis without hematuria    ED Discharge Orders         Ordered    hydrochlorothiazide (HYDRODIURIL) 25 MG tablet  Daily,   Status:  Discontinued     11/18/17 2216    cloNIDine (CATAPRES) 0.2 MG tablet  2 times daily,   Status:  Discontinued     11/18/17 2216    cloNIDine (CATAPRES) 0.2 MG tablet  2 times daily     11/18/17 2303    hydrochlorothiazide (HYDRODIURIL) 25 MG tablet  Daily     11/18/17 2303           Liberty Handy, PA-C 11/19/17 0016    Arby Barrette, MD 11/29/17 1950    Liberty Handy, PA-C 12/09/17 1020    Arby Barrette, MD 12/12/17 1323

## 2017-11-20 LAB — URINE CULTURE

## 2017-12-14 ENCOUNTER — Encounter (HOSPITAL_COMMUNITY): Payer: Self-pay | Admitting: *Deleted

## 2017-12-14 ENCOUNTER — Emergency Department (HOSPITAL_COMMUNITY)
Admission: EM | Admit: 2017-12-14 | Discharge: 2017-12-14 | Disposition: A | Discharge status: Home / Self Care | Payer: Self-pay | Attending: Emergency Medicine | Admitting: Emergency Medicine

## 2017-12-14 DIAGNOSIS — R631 Polydipsia: Secondary | ICD-10-CM | POA: Insufficient documentation

## 2017-12-14 DIAGNOSIS — R350 Frequency of micturition: Secondary | ICD-10-CM | POA: Insufficient documentation

## 2017-12-14 DIAGNOSIS — N390 Urinary tract infection, site not specified: Secondary | ICD-10-CM

## 2017-12-14 DIAGNOSIS — I10 Essential (primary) hypertension: Secondary | ICD-10-CM

## 2017-12-14 DIAGNOSIS — R739 Hyperglycemia, unspecified: Secondary | ICD-10-CM

## 2017-12-14 DIAGNOSIS — F17210 Nicotine dependence, cigarettes, uncomplicated: Secondary | ICD-10-CM | POA: Insufficient documentation

## 2017-12-14 LAB — CBC WITH DIFFERENTIAL/PLATELET
Abs Immature Granulocytes: 0.05 10*3/uL (ref 0.00–0.07)
Basophils Absolute: 0 10*3/uL (ref 0.0–0.1)
Basophils Relative: 0 %
EOS ABS: 0.1 10*3/uL (ref 0.0–0.5)
EOS PCT: 1 %
HEMATOCRIT: 41.9 % (ref 36.0–46.0)
Hemoglobin: 14 g/dL (ref 12.0–15.0)
Immature Granulocytes: 1 %
LYMPHS ABS: 2.4 10*3/uL (ref 0.7–4.0)
Lymphocytes Relative: 33 %
MCH: 27.3 pg (ref 26.0–34.0)
MCHC: 33.4 g/dL (ref 30.0–36.0)
MCV: 81.8 fL (ref 80.0–100.0)
MONO ABS: 0.4 10*3/uL (ref 0.1–1.0)
Monocytes Relative: 6 %
NRBC: 0 % (ref 0.0–0.2)
Neutro Abs: 4.3 10*3/uL (ref 1.7–7.7)
Neutrophils Relative %: 59 %
Platelets: 265 10*3/uL (ref 150–400)
RBC: 5.12 MIL/uL — ABNORMAL HIGH (ref 3.87–5.11)
RDW: 18.8 % — AB (ref 11.5–15.5)
WBC: 7.3 10*3/uL (ref 4.0–10.5)

## 2017-12-14 LAB — URINALYSIS, ROUTINE W REFLEX MICROSCOPIC
BACTERIA UA: NONE SEEN
Bilirubin Urine: NEGATIVE
KETONES UR: NEGATIVE mg/dL
Nitrite: NEGATIVE
PROTEIN: 100 mg/dL — AB
Specific Gravity, Urine: 1.018 (ref 1.005–1.030)
pH: 6 (ref 5.0–8.0)

## 2017-12-14 LAB — PREGNANCY, URINE: Preg Test, Ur: NEGATIVE

## 2017-12-14 LAB — BASIC METABOLIC PANEL
Anion gap: 10 (ref 5–15)
BUN: 14 mg/dL (ref 6–20)
CO2: 22 mmol/L (ref 22–32)
CREATININE: 1.06 mg/dL — AB (ref 0.44–1.00)
Calcium: 9 mg/dL (ref 8.9–10.3)
Chloride: 103 mmol/L (ref 98–111)
GFR calc non Af Amer: >60 mL/min (ref >60)
GLUCOSE: 309 mg/dL — AB (ref 70–99)
Potassium: 4 mmol/L (ref 3.5–5.1)
Sodium: 135 mmol/L (ref 135–145)

## 2017-12-14 LAB — CBG MONITORING, ED
Glucose-Capillary: 306 mg/dL — ABNORMAL HIGH (ref 70–99)
Glucose-Capillary: 314 mg/dL — ABNORMAL HIGH (ref 70–99)

## 2017-12-14 MED ORDER — SODIUM CHLORIDE 0.9 % IV SOLN
1.0000 g | INTRAVENOUS | Status: DC
  Administered 2017-12-14: 1 g via INTRAVENOUS
  Filled 2017-12-14: qty 10

## 2017-12-14 MED ORDER — INSULIN ASPART 100 UNIT/ML ~~LOC~~ SOLN
6.0000 [IU] | Freq: Once | SUBCUTANEOUS | Status: AC
  Administered 2017-12-14: 6 [IU] via SUBCUTANEOUS
  Filled 2017-12-14: qty 1

## 2017-12-14 MED ORDER — ACETAMINOPHEN 325 MG PO TABS
650.0000 mg | ORAL_TABLET | Freq: Once | ORAL | Status: AC
  Administered 2017-12-14: 650 mg via ORAL
  Filled 2017-12-14: qty 2

## 2017-12-14 MED ORDER — SODIUM CHLORIDE 0.9 % IV SOLN
INTRAVENOUS | Status: DC
  Administered 2017-12-14: 20 mL/h via INTRAVENOUS

## 2017-12-14 MED ORDER — AMLODIPINE BESYLATE 5 MG PO TABS: 5.0000 mg | ORAL_TABLET | Freq: Every day | ORAL | 0 refills | Status: DC

## 2017-12-14 MED ORDER — METFORMIN HCL 500 MG PO TABS: 500.0000 mg | ORAL_TABLET | Freq: Two times a day (BID) | ORAL | 0 refills | Status: DC

## 2017-12-14 MED ORDER — LABETALOL HCL 5 MG/ML IV SOLN
20.0000 mg | Freq: Once | INTRAVENOUS | Status: AC
  Administered 2017-12-14: 20 mg via INTRAVENOUS
  Filled 2017-12-14: qty 4

## 2017-12-14 MED ORDER — CEPHALEXIN 500 MG PO CAPS: 500.0000 mg | ORAL_CAPSULE | Freq: Four times a day (QID) | ORAL | 0 refills | Status: DC

## 2017-12-14 MED ORDER — BLOOD GLUCOSE MONITOR KIT: PACK | 0 refills | Status: DC

## 2017-12-14 MED ORDER — LABETALOL HCL 5 MG/ML IV SOLN
10.0000 mg | Freq: Once | INTRAVENOUS | Status: AC
  Administered 2017-12-14: 10 mg via INTRAVENOUS
  Filled 2017-12-14: qty 4

## 2017-12-14 NOTE — ED Triage Notes (Signed)
Pt reports blood in urine for the past 2 weeks and lower abdominal and back pain. Pt is concerned she has a UTI. Pt is hypertensive in triage but states she cannot afford to buy her medication.

## 2017-12-14 NOTE — ED Notes (Signed)
Pt provided with diet gingerale and graham crackers as requested and approved by MD. Camie Patience without difficulty.

## 2017-12-14 NOTE — ED Provider Notes (Addendum)
[facility]-EMERGENCY DEPT Provider Note   CSN: [id number] Arrival date & time: 12/14/17  1519     History   Chief Complaint Chief Complaint  Patient presents with  . Back Pain  . Hematuria    HPI Stephanie [name] is a 47 y.o. female.  47 year old female presents with blood in urine for 2 weeks with associated polyuria as well as polydipsia.  Seen here a few weeks ago and diagnosed with UTI and treated with fosfomycin.  Was also given prescriptions for antihypertensives after being treated for hypertensive urgency but she did not get this prescription filled due to financial pressures.  Denies any headache or visual changes.  No chest pain or shortness of breath.  No flank pain at this time.  No fever or chills.     Past Medical History:  Diagnosis Date  . Hypertension     There are no active problems to display for this patient.   History reviewed. No pertinent surgical history.   OB History   None      Home Medications    Prior to Admission medications   Not on File    Family History No family history on file.  Social History Social History   Tobacco Use  . Smoking status: Current Every Day Smoker    Packs/day: 0.50  . Smokeless tobacco: Never Used  Substance Use Topics  . Alcohol use: Yes    Alcohol/week: 6.0 standard drinks    Types: 6 Cans of beer per week    Comment: daily  . Drug use: No     Allergies   Patient has no known allergies.   Review of Systems Review of Systems  All other systems reviewed and are negative.    Physical Exam Updated Vital Signs BP (!) 221/128 (BP Location: Right Arm)   Pulse 98   Temp 98.9 F (37.2 C) (Oral)   Resp 18   SpO2 94%   Physical Exam  Constitutional: She is oriented to person, place, and time. She appears well-developed and well-nourished.  Non-toxic appearance. No distress.  HENT:  Head: Normocephalic and atraumatic.  Eyes: Pupils are equal, round, and reactive  to light. Conjunctivae, EOM and lids are normal.  Neck: Normal range of motion. Neck supple. No tracheal deviation present. No thyroid mass present.  Cardiovascular: Normal rate, regular rhythm and normal heart sounds. Exam reveals no gallop.  No murmur heard. Pulmonary/Chest: Effort normal and breath sounds normal. No stridor. No respiratory distress. She has no decreased breath sounds. She has no wheezes. She has no rhonchi. She has no rales.  Abdominal: Soft. Normal appearance and bowel sounds are normal. She exhibits no distension. There is no tenderness. There is no rebound and no CVA tenderness.  Musculoskeletal: Normal range of motion. She exhibits no edema or tenderness.  Neurological: She is alert and oriented to person, place, and time. She has normal strength. No cranial nerve deficit or sensory deficit. GCS eye subscore is 4. GCS verbal subscore is 5. GCS motor subscore is 6.  Skin: Skin is warm and dry. No abrasion and no rash noted.  Psychiatric: She has a normal mood and affect. Her speech is normal and behavior is normal.  Nursing note and vitals reviewed.    ED Treatments / Results  Labs (all labs ordered are listed, but only abnormal results are displayed) Labs Reviewed  URINALYSIS, ROUTINE W REFLEX MICROSCOPIC - Abnormal; Notable for the following components:      Result  Value   Glucose, UA >=500 (*)    Hgb urine dipstick MODERATE (*)    Protein, ur 100 (*)    Leukocytes, UA MODERATE (*)    All other components within normal limits  URINE CULTURE  PREGNANCY, URINE  CBC WITH DIFFERENTIAL/PLATELET  BASIC METABOLIC PANEL    EKG None  Radiology No results found.  Procedures Procedures (including critical care time)  Medications Ordered in ED Medications  0.9 %  sodium chloride infusion (has no administration in time range)  cefTRIAXone (ROCEPHIN) 1 g in sodium chloride 0.9 % 100 mL IVPB (has no administration in time range)  labetalol (NORMODYNE,TRANDATE)  injection 20 mg (has no administration in time range)     Initial Impression / Assessment and Plan / ED Course  I have reviewed the triage vital signs and the nursing notes.  Pertinent labs & imaging results that were available during my care of the patient were reviewed by me and considered in my medical decision making (see chart for details).     She is here with hypertension which was treated with IV labetalol x2.  Renal function within normal limits.  Has mild hyperglycemia which was treated with regular insulin 6 units.  She has no evidence of increased anion gap.  Does have evidence of UTI that was treated with IV Rocephin.  Patient will be placed on antihypertensives as well as antibiotics.  Also start patient on metformin and give her referral to the community wellness center.  Patient given coupons for her medications.  Final Clinical Impressions(s) / ED Diagnoses   Final diagnoses:  None    ED Discharge Orders    None       Lorre Nick, MD 12/14/17 2132    Lorre Nick, MD 12/14/17 2135

## 2017-12-16 LAB — URINE CULTURE: Culture: 60000 — AB

## 2017-12-17 ENCOUNTER — Telehealth: Payer: Self-pay | Admitting: Emergency Medicine

## 2017-12-17 NOTE — Telephone Encounter (Signed)
Post ED Visit - Positive Culture Follow-up  Culture report reviewed by antimicrobial stewardship pharmacist:  []  Enzo BiNathan Batchelder, Pharm.D. []  Celedonio MiyamotoJeremy Frens, Pharm.D., BCPS AQ-ID []  Garvin FilaMike Maccia, Pharm.D., BCPS []  Georgina PillionElizabeth Martin, [address]Ph[address] BCPS []  EdwardsvilleMinh Pham, [address]Pharm[address]D., BCPS, AAHIVP []  Estella HuskMichelle Turner, Pharm.D., BCPS, AAHIVP []  Lysle Pearlachel Rumbarger, PharmD, BCPS []  Phillips Climeshuy Dang, PharmD, BCPS []  Agapito GamesAlison Masters, PharmD, BCPS []  Verlan FriendsErin Deja, PharmD [facility] HealthJosh Tessin PharmD  Positive urine culture Treated with cephalexin, organism sensitive to the same and no further patient follow-up is required at this time.  Berle MullMiller, Taneasha Fuqua 12/17/2017, 10:16 AM

## 2017-12-24 ENCOUNTER — Ambulatory Visit: Payer: Self-pay | Admitting: Family Medicine

## 2017-12-26 ENCOUNTER — Encounter: Payer: Self-pay | Admitting: Family Medicine

## 2017-12-26 ENCOUNTER — Ambulatory Visit (INDEPENDENT_AMBULATORY_CARE_PROVIDER_SITE_OTHER): Payer: Self-pay | Admitting: Family Medicine

## 2017-12-26 ENCOUNTER — Other Ambulatory Visit: Payer: Self-pay

## 2017-12-26 VITALS — BP 171/121 | HR 95 | Temp 98.0°F | Resp 16 | Wt 150.6 lb

## 2017-12-26 DIAGNOSIS — R739 Hyperglycemia, unspecified: Secondary | ICD-10-CM

## 2017-12-26 DIAGNOSIS — I10 Essential (primary) hypertension: Secondary | ICD-10-CM

## 2017-12-26 DIAGNOSIS — E119 Type 2 diabetes mellitus without complications: Secondary | ICD-10-CM

## 2017-12-26 DIAGNOSIS — Z1389 Encounter for screening for other disorder: Secondary | ICD-10-CM

## 2017-12-26 LAB — POCT URINALYSIS DIP (CLINITEK)
Bilirubin, UA: NEGATIVE
Ketones, POC UA: NEGATIVE mg/dL
Nitrite, UA: NEGATIVE
UROBILINOGEN UA: 0.2 U/dL
pH, UA: 5.5 (ref 5.0–8.0)

## 2017-12-26 LAB — GLUCOSE, POCT (MANUAL RESULT ENTRY): POC Glucose: 261 mg/dl — AB (ref 70–99)

## 2017-12-26 MED ORDER — GLIPIZIDE 5 MG PO TABS: 2.5000 mg | ORAL_TABLET | Freq: Every day | ORAL | 2 refills | Status: DC

## 2017-12-26 MED ORDER — BLOOD GLUCOSE MONITOR KIT: PACK | 0 refills | Status: DC

## 2017-12-26 MED ORDER — AMLODIPINE BESYLATE 10 MG PO TABS: 10.0000 mg | ORAL_TABLET | Freq: Every day | ORAL | 2 refills | Status: DC

## 2017-12-26 MED ORDER — METFORMIN HCL 500 MG PO TABS: 500.0000 mg | ORAL_TABLET | Freq: Two times a day (BID) | ORAL | 2 refills | Status: DC

## 2017-12-26 NOTE — Progress Notes (Signed)
HFU- hematuria HTN

## 2017-12-26 NOTE — Progress Notes (Signed)
Stephanie [name], is a 47 y.o. female  CLE:[id number]  BSW:[id number]  DOB - January 02, 1971  CC:  Chief Complaint  Patient presents with  . Follow-up       HPI: Stephanie [name] is a 47 y.o. female is here today to establish care.   Stephanie [name] does not have a problem list on file.    Today's visit:  Stephanie [name] here to establish care and ED follow-up. Patient presented to the ER on at [facility] 12/24/2017 with a complaint of back pain and hematuria on 12/24/2017.  He had previously been treated for UTI 2 weeks ago.  Her blood pressure was grossly elevated upon arrival at the ER.  She was treated for hypertension urgency with 2 doses of IV labetalol.  She was found to have hyperglycemia and given a total of 6 units of insulin to reduce hyperglycemia.  For UTI she was treated with IV Rocephin and started on outpatient antibiotics.  She was also given a prescription for metformin to start upon discharge from the ER.  She reports that her blood sugars have remained elevated since ER visit.  Denies any prior known history of diabetes.  Has a family history significant for diabetes. No recent A1C. She is checking blood sugar at home as someone had given her a meter. Currently taking metformin consistently. Patient denies new headaches, chest pain, abdominal pain, nausea, new weakness , numbness or tingling, SOB, edema, or worrisome cough.  Current medications: Current Outpatient Medications:  .  amLODipine (NORVASC) 5 MG tablet, Take 1 tablet (5 mg total) by mouth daily., Disp: 30 tablet, Rfl: 0 .  metFORMIN (GLUCOPHAGE) 500 MG tablet, Take 1 tablet (500 mg total) by mouth 2 (two) times daily with a meal., Disp: 60 tablet, Rfl: 0 .  blood glucose meter kit and supplies KIT, Dispense based on patient and insurance preference. Use up to four times daily as directed. (FOR ICD-9 250.00, 250.01)., Disp: 1 each, Rfl: 0   Pertinent family medical history: Family history mother and maternal  grandmother, mother hypertension    No Known Allergies  Social History   Socioeconomic History  . Marital status: Single    Spouse name: Not on file  . Number of children: Not on file  . Years of education: Not on file  . Highest education level: Not on file  Occupational History  . Not on file  Social Needs  . Financial resource strain: Not on file  . Food insecurity:    Worry: Not on file    Inability: Not on file  . Transportation needs:    Medical: Not on file    Non-medical: Not on file  Tobacco Use  . Smoking status: Current Every Day Smoker    Packs/day: 0.50  . Smokeless tobacco: Never Used  Substance and Sexual Activity  . Alcohol use: Yes    Alcohol/week: 6.0 standard drinks    Types: 6 Cans of beer per week    Comment: daily  . Drug use: No  . Sexual activity: Not on file  Lifestyle  . Physical activity:    Days per week: Not on file    Minutes per session: Not on file  . Stress: Not on file  Relationships  . Social connections:    Talks on phone: Not on file    Gets together: Not on file    Attends religious service: Not on file    Active member of club or organization: Not on file  Attends meetings of clubs or organizations: Not on file    Relationship status: Not on file  . Intimate partner violence:    Fear of current or ex partner: Not on file    Emotionally abused: Not on file    Physically abused: Not on file    Forced sexual activity: Not on file  Other Topics Concern  . Not on file  Social History Narrative  . Not on file    Review of Systems: Constitutional: Negative for fever, chills, diaphoresis, activity change, appetite change and fatigue. HENT: Negative for ear pain, nosebleeds, congestion, facial swelling, rhinorrhea, neck pain, neck stiffness and ear discharge.  Eyes: Negative for pain, discharge, redness, itching and visual disturbance. Respiratory: Negative for cough, choking, chest tightness, shortness of breath, wheezing  and stridor.  Cardiovascular: Negative for chest pain, palpitations and leg swelling. Gastrointestinal: Negative for abdominal distention. Genitourinary: Negative for dysuria, urgency, frequency, hematuria, flank pain, decreased urine volume, difficulty urinating. Musculoskeletal: Negative for back pain, joint swelling, arthralgia and gait problem. Neurological: Negative for dizziness, tremors, seizures, syncope, facial asymmetry, speech difficulty, weakness, light-headedness, numbness and headaches.  Hematological: Negative for adenopathy. Does not bruise/bleed easily. Psychiatric/Behavioral: Negative for hallucinations, behavioral problems, confusion, dysphoric mood, decreased concentration and agitation.    Objective:   Vitals:   12/26/17 1627  BP: (!) 171/121  Pulse: 95  Resp: 16  Temp: 98 F (36.7 C)  SpO2: 97%    BP Readings from Last 3 Encounters:  12/26/17 (!) 171/121  12/14/17 (!) 188/110  11/18/17 (!) 171/95    Filed Weights   12/26/17 1627  Weight: 150 lb 9.6 oz (68.3 kg)      Physical Exam: Constitutional: Patient appears well-developed and well-nourished. No distress. HENT: Normocephalic, atraumatic, External right and left ear normal. Oropharynx is clear and moist.  Eyes: Conjunctivae and EOM are normal. PERRLA, no scleral icterus. Neck: Normal ROM. Neck supple. No JVD. No tracheal deviation. No thyromegaly. CVS: RRR, S1/S2 +, no murmurs, no gallops, no carotid bruit.  Pulmonary: Effort and breath sounds normal, no stridor, rhonchi, wheezes, rales.  Abdominal: Soft. BS +, no distension, tenderness, rebound or guarding.  Musculoskeletal: Normal range of motion. No edema and no tenderness.  Neuro: Alert. Normal muscle tone coordination. Normal gait. BUE and BLE strength 5/5. Bilateral hand grips symmetrical. No cranial nerve deficit. Skin: Skin is warm and dry. No rash noted. Not diaphoretic. No erythema. No pallor. Psychiatric: Normal mood and affect.  Behavior, judgment, thought content normal.  Lab Results (prior encounters)  Lab Results  Component Value Date   WBC 7.3 12/14/2017   HGB 14.0 12/14/2017   HCT 41.9 12/14/2017   MCV 81.8 12/14/2017   PLT 265 12/14/2017   Lab Results  Component Value Date   CREATININE 1.06 (H) 12/14/2017   BUN 14 12/14/2017   NA 135 12/14/2017   K 4.0 12/14/2017   CL 103 12/14/2017   CO2 22 12/14/2017    No results found for: HGBA1C  No results found for: CHOL, TRIG, HDL, CHOLHDL, VLDL, LDLCALC      Assessment and plan:  1. Screening for blood or protein in urine - POCT URINALYSIS DIP (CLINITEK)  2. Elevated blood sugar, BS on arrival 261.  - Hemoglobin A1c, pending   3. Type 2 diabetes mellitus without complication, without long-term current use of insulin (HCC) - Hemoglobin A1c pending  -Adding glipizide  2.5 mg once daily. Aim for 30 minutes of exercise most days, with a goal of 150  minutes per week. -Glucose monitoring at minimal of twice daily and keep a log of readings. -Commit to medication adherence and self-adjustment as needed -increase foods containing whole grains (one-half of grain intake). -saturated fat intake should be reduced -reduce intake of trans fat (lowers LDL cholesterol and increases HDL cholesterol) -Eat 4-5 small meals during the day to reduce the risk of becoming hungry.  4. Accelerated hypertension, uncontrolled  Increase amlodipine 10 mg once daily  We have discussed target BP range and blood pressure goal. I have advised patient to check BP regularly and to call us back or report to clinic if the numbers are consistently higher than 140/90. We discussed the importance of compliance with medical therapy and DASH diet recommended, consequences of uncontrolled hypertension discussed.  - continue current BP medications     Meds ordered this encounter  Medications  . glipiZIDE (GLUCOTROL) 5 MG tablet    Sig: Take 0.5 tablets (2.5 mg total) by mouth daily  before breakfast.    Dispense:  30 tablet    Refill:  2  . DISCONTD: blood glucose meter kit and supplies KIT    Sig: Dispense based on patient and insurance preference. Use up to four times daily as directed. (FOR ICD-10 E.11.8).    Dispense:  1 each    Refill:  0    Please demonstrate use of meter    Order Specific Question:   Number of strips    Answer:   100    Order Specific Question:   Number of lancets    Answer:   100  . amLODipine (NORVASC) 10 MG tablet    Sig: Take 1 tablet (10 mg total) by mouth daily.    Dispense:  30 tablet    Refill:  2    Patient will pick up when needed  . metFORMIN (GLUCOPHAGE) 500 MG tablet    Sig: Take 1 tablet (500 mg total) by mouth 2 (two) times daily with a meal.    Dispense:  60 tablet    Refill:  2    Patient will pick up when needed  . DISCONTD: blood glucose meter kit and supplies KIT    Sig: Dispense based on patient and insurance preference. Use up to four times daily as directed. (FOR ICD-10 E.11.8).    Dispense:  1 each    Refill:  0    Please demonstrate use of meter    Order Specific Question:   Number of strips    Answer:   100    Order Specific Question:   Number of lancets    Answer:   100  . DISCONTD: blood glucose meter kit and supplies KIT    Sig: Dispense based on patient and insurance preference. Use up to four times daily as directed. (FOR ICD-10 E.11.8).    Dispense:  1 each    Refill:  0    Please demonstrate use of meter    Order Specific Question:   Number of strips    Answer:   100    Order Specific Question:   Number of lancets    Answer:   100  . blood glucose meter kit and supplies KIT    Sig: Dispense based on patient and insurance preference. Use up to four times daily as directed. (FOR ICD-10 E.11.8).    Dispense:  1 each    Refill:  0    Please demonstrate use of meter    Order Specific Question:  Number of strips    Answer:   100    Order Specific Question:   Number of lancets    Answer:   100      No follow-ups on file.   The patient was given clear instructions to go to ER or return to medical center if symptoms don't improve, worsen or new problems develop. The patient verbalized understanding. The patient was advised  to call and obtain lab results if they haven't heard anything from out office within 7-10 business days.  Molli Barrows, FNP [facility] at [facility] [address] [place] [address] [phone]: [phone]   This note has been created with Dragon speech recognition software and sEngineer, materials Any transcriptional errors are unintentional.

## 2017-12-26 NOTE — Patient Instructions (Signed)
Diabetes Mellitus and Nutrition When you have diabetes (diabetes mellitus), it is very important to have healthy eating habits because your blood sugar (glucose) levels are greatly affected by what you eat and drink. Eating healthy foods in the appropriate amounts, at about the same times every day, can help you:  Control your blood glucose.  Lower your risk of heart disease.  Improve your blood pressure.  Reach or maintain a healthy weight.  Every person with diabetes is different, and each person has different needs for a meal plan. Your health care provider may recommend that you work with a diet and nutrition specialist (dietitian) to make a meal plan that is best for you. Your meal plan may vary depending on factors such as:  The calories you need.  The medicines you take.  Your weight.  Your blood glucose, blood pressure, and cholesterol levels.  Your activity level.  Other health conditions you have, such as heart or kidney disease.  How do carbohydrates affect me? Carbohydrates affect your blood glucose level more than any other type of food. Eating carbohydrates naturally increases the amount of glucose in your blood. Carbohydrate counting is a method for keeping track of how many carbohydrates you eat. Counting carbohydrates is important to keep your blood glucose at a healthy level, especially if you use insulin or take certain oral diabetes medicines. It is important to know how many carbohydrates you can safely have in each meal. This is different for every person. Your dietitian can help you calculate how many carbohydrates you should have at each meal and for snack. Foods that contain carbohydrates include:  Bread, cereal, rice, pasta, and crackers.  Potatoes and corn.  Peas, beans, and lentils.  Milk and yogurt.  Fruit and juice.  Desserts, such as cakes, cookies, ice cream, and candy.  How does alcohol affect me? Alcohol can cause a sudden decrease in blood  glucose (hypoglycemia), especially if you use insulin or take certain oral diabetes medicines. Hypoglycemia can be a life-threatening condition. Symptoms of hypoglycemia (sleepiness, dizziness, and confusion) are similar to symptoms of having too much alcohol. If your health care provider says that alcohol is safe for you, follow these guidelines:  Limit alcohol intake to no more than 1 drink per day for nonpregnant women and 2 drinks per day for men. One drink equals 12 oz of beer, 5 oz of wine, or 1 oz of hard liquor.  Do not drink on an empty stomach.  Keep yourself hydrated with water, diet soda, or unsweetened iced tea.  Keep in mind that regular soda, juice, and other mixers may contain a lot of sugar and must be counted as carbohydrates.  What are tips for following this plan? Reading food labels  Start by checking the serving size on the label. The amount of calories, carbohydrates, fats, and other nutrients listed on the label are based on one serving of the food. Many foods contain more than one serving per package.  Check the total grams (g) of carbohydrates in one serving. You can calculate the number of servings of carbohydrates in one serving by dividing the total carbohydrates by 15. For example, if a food has 30 g of total carbohydrates, it would be equal to 2 servings of carbohydrates.  Check the number of grams (g) of saturated and trans fats in one serving. Choose foods that have low or no amount of these fats.  Check the number of milligrams (mg) of sodium in one serving. Most people   should limit total sodium intake to less than 2,300 mg per day.  Always check the nutrition information of foods labeled as "low-fat" or "nonfat". These foods may be higher in added sugar or refined carbohydrates and should be avoided.  Talk to your dietitian to identify your daily goals for nutrients listed on the label. Shopping  Avoid buying canned, premade, or processed foods. These  foods tend to be high in fat, sodium, and added sugar.  Shop around the outside edge of the grocery store. This includes fresh fruits and vegetables, bulk grains, fresh meats, and fresh dairy. Cooking  Use low-heat cooking methods, such as baking, instead of high-heat cooking methods like deep frying.  Cook using healthy oils, such as olive, canola, or sunflower oil.  Avoid cooking with butter, cream, or high-fat meats. Meal planning  Eat meals and snacks regularly, preferably at the same times every day. Avoid going long periods of time without eating.  Eat foods high in fiber, such as fresh fruits, vegetables, beans, and whole grains. Talk to your dietitian about how many servings of carbohydrates you can eat at each meal.  Eat 4-6 ounces of lean protein each day, such as lean meat, chicken, fish, eggs, or tofu. 1 ounce is equal to 1 ounce of meat, chicken, or fish, 1 egg, or 1/4 cup of tofu.  Eat some foods each day that contain healthy fats, such as avocado, nuts, seeds, and fish. Lifestyle   Check your blood glucose regularly.  Exercise at least 30 minutes 5 or more days each week, or as told by your health care provider.  Take medicines as told by your health care provider.  Do not use any products that contain nicotine or tobacco, such as cigarettes and e-cigarettes. If you need help quitting, ask your health care provider.  Work with a counselor or diabetes educator to identify strategies to manage stress and any emotional and social challenges. What are some questions to ask my health care provider?  Do I need to meet with a diabetes educator?  Do I need to meet with a dietitian?  What number can I call if I have questions?  When are the best times to check my blood glucose? Where to find more information:  American Diabetes Association: diabetes.org/food-and-fitness/food  Academy of Nutrition and Dietetics:  www.eatright.org/resources/health/diseases-and-conditions/diabetes  National Institute of Diabetes and Digestive and Kidney Diseases (NIH): www.niddk.nih.gov/health-information/diabetes/overview/diet-eating-physical-activity Summary  A healthy meal plan will help you control your blood glucose and maintain a healthy lifestyle.  Working with a diet and nutrition specialist (dietitian) can help you make a meal plan that is best for you.  Keep in mind that carbohydrates and alcohol have immediate effects on your blood glucose levels. It is important to count carbohydrates and to use alcohol carefully. This information is not intended to replace advice given to you by your health care provider. Make sure you discuss any questions you have with your health care provider. Document Released: 10/19/2004 Document Revised: 02/27/2016 Document Reviewed: 02/27/2016 Elsevier Interactive Patient Education  2018 [employer]vier Inc. Hypertension Hypertension, commonly called high blood pressure, is when the force of blood pumping through the arteries is too strong. The arteries are the blood vessels that carry blood from the heart throughout the body. Hypertension forces the heart to work harder to pump blood and may cause arteries to become narrow or stiff. Having untreated or uncontrolled hypertension can cause heart attacks, strokes, kidney disease, and other problems. A blood pressure reading consists of a higher   number over a lower number. Ideally, your blood pressure should be below 120/80. The first ("top") number is called the systolic pressure. It is a measure of the pressure in your arteries as your heart beats. The second ("bottom") number is called the diastolic pressure. It is a measure of the pressure in your arteries as the heart relaxes. What are the causes? The cause of this condition is not known. What increases the risk? Some risk factors for high blood pressure are under your control. Others are  not. Factors you can change  Smoking.  Having type 2 diabetes mellitus, high cholesterol, or both.  Not getting enough exercise or physical activity.  Being overweight.  Having too much fat, sugar, calories, or salt (sodium) in your diet.  Drinking too much alcohol. Factors that are difficult or impossible to change  Having chronic kidney disease.  Having a family history of high blood pressure.  Age. Risk increases with age.  Race. You may be at higher risk if you are African-American.  Gender. Men are at higher risk than women before age 45. After age 65, women are at higher risk than men.  Having obstructive sleep apnea.  Stress. What are the signs or symptoms? Extremely high blood pressure (hypertensive crisis) may cause:  Headache.  Anxiety.  Shortness of breath.  Nosebleed.  Nausea and vomiting.  Severe chest pain.  Jerky movements you cannot control (seizures).  How is this diagnosed? This condition is diagnosed by measuring your blood pressure while you are seated, with your arm resting on a surface. The cuff of the blood pressure monitor will be placed directly against the skin of your upper arm at the level of your heart. It should be measured at least twice using the same arm. Certain conditions can cause a difference in blood pressure between your right and left arms. Certain factors can cause blood pressure readings to be lower or higher than normal (elevated) for a short period of time:  When your blood pressure is higher when you are in a health care provider's office than when you are at home, this is called white coat hypertension. Most people with this condition do not need medicines.  When your blood pressure is higher at home than when you are in a health care provider's office, this is called masked hypertension. Most people with this condition may need medicines to control blood pressure.  If you have a high blood pressure reading during one  visit or you have normal blood pressure with other risk factors:  You may be asked to return on a different day to have your blood pressure checked again.  You may be asked to monitor your blood pressure at home for 1 week or longer.  If you are diagnosed with hypertension, you may have other blood or imaging tests to help your health care provider understand your overall risk for other conditions. How is this treated? This condition is treated by making healthy lifestyle changes, such as eating healthy foods, exercising more, and reducing your alcohol intake. Your health care provider may prescribe medicine if lifestyle changes are not enough to get your blood pressure under control, and if:  Your systolic blood pressure is above 130.  Your diastolic blood pressure is above 80.  Your personal target blood pressure may vary depending on your medical conditions, your age, and other factors. Follow these instructions at home: Eating and drinking  Eat a diet that is high in fiber and potassium, and low   in sodium, added sugar, and fat. An example eating plan is called the DASH (Dietary Approaches to Stop Hypertension) diet. To eat this way: ? Eat plenty of fresh fruits and vegetables. Try to fill half of your plate at each meal with fruits and vegetables. ? Eat whole grains, such as whole wheat pasta, brown rice, or whole grain bread. Fill about one quarter of your plate with whole grains. ? Eat or drink low-fat dairy products, such as skim milk or low-fat yogurt. ? Avoid fatty cuts of meat, processed or cured meats, and poultry with skin. Fill about one quarter of your plate with lean proteins, such as fish, chicken without skin, beans, eggs, and tofu. ? Avoid premade and processed foods. These tend to be higher in sodium, added sugar, and fat.  Reduce your daily sodium intake. Most people with hypertension should eat less than 1,500 mg of sodium a day.  Limit alcohol intake to no more than 1  drink a day for nonpregnant women and 2 drinks a day for men. One drink equals 12 oz of beer, 5 oz of wine, or 1 oz of hard liquor. Lifestyle  Work with your health care provider to maintain a healthy body weight or to lose weight. Ask what an ideal weight is for you.  Get at least 30 minutes of exercise that causes your heart to beat faster (aerobic exercise) most days of the week. Activities may include walking, swimming, or biking.  Include exercise to strengthen your muscles (resistance exercise), such as pilates or lifting weights, as part of your weekly exercise routine. Try to do these types of exercises for 30 minutes at least 3 days a week.  Do not use any products that contain nicotine or tobacco, such as cigarettes and e-cigarettes. If you need help quitting, ask your health care provider.  Monitor your blood pressure at home as told by your health care provider.  Keep all follow-up visits as told by your health care provider. This is important. Medicines  Take over-the-counter and prescription medicines only as told by your health care provider. Follow directions carefully. Blood pressure medicines must be taken as prescribed.  Do not skip doses of blood pressure medicine. Doing this puts you at risk for problems and can make the medicine less effective.  Ask your health care provider about side effects or reactions to medicines that you should watch for. Contact a health care provider if:  You think you are having a reaction to a medicine you are taking.  You have headaches that keep coming back (recurring).  You feel dizzy.  You have swelling in your ankles.  You have trouble with your vision. Get help right away if:  You develop a severe headache or confusion.  You have unusual weakness or numbness.  You feel faint.  You have severe pain in your chest or abdomen.  You vomit repeatedly.  You have trouble breathing. Summary  Hypertension is when the force  of blood pumping through your arteries is too strong. If this condition is not controlled, it may put you at risk for serious complications.  Your personal target blood pressure may vary depending on your medical conditions, your age, and other factors. For most people, a normal blood pressure is less than 120/80.  Hypertension is treated with lifestyle changes, medicines, or a combination of both. Lifestyle changes include weight loss, eating a healthy, low-sodium diet, exercising more, and limiting alcohol. This information is not intended to replace advice given   to you by your health care provider. Make sure you discuss any questions you have with your health care provider. Document Released: 01/22/2005 Document Revised: 12/21/2015 Document Reviewed: 12/21/2015 Elsevier Interactive Patient Education  2018 Elsevier Inc.  

## 2017-12-27 LAB — HEMOGLOBIN A1C
ESTIMATED AVERAGE GLUCOSE: 194 mg/dL
Hgb A1c MFr Bld: 8.4 % — ABNORMAL HIGH (ref 4.8–5.6)

## 2017-12-27 MED ORDER — BLOOD GLUCOSE MONITOR KIT: PACK | 0 refills | Status: DC

## 2017-12-27 MED FILL — glipiZIDE 5 MG TABS: 5 | 30 days supply | Qty: 15 | Fill #0

## 2017-12-31 DIAGNOSIS — R739 Hyperglycemia, unspecified: Secondary | ICD-10-CM | POA: Insufficient documentation

## 2017-12-31 DIAGNOSIS — I10 Essential (primary) hypertension: Secondary | ICD-10-CM | POA: Insufficient documentation

## 2017-12-31 DIAGNOSIS — E119 Type 2 diabetes mellitus without complications: Secondary | ICD-10-CM | POA: Insufficient documentation

## 2017-12-31 MED FILL — !TRUE METRIX BLOOD GLUCOSE: 365 days supply | Qty: 1 | Fill #0

## 2017-12-31 MED FILL — TRUE METRIX TEST STRIP: 25 days supply | Qty: 100 | Fill #0

## 2017-12-31 MED FILL — TRUEplus LANCETS 28G MISC: 25 days supply | Qty: 100 | Fill #0

## 2018-01-01 ENCOUNTER — Ambulatory Visit (INDEPENDENT_AMBULATORY_CARE_PROVIDER_SITE_OTHER): Payer: Self-pay | Admitting: Family Medicine

## 2018-01-01 VITALS — BP 187/118

## 2018-01-01 DIAGNOSIS — E119 Type 2 diabetes mellitus without complications: Secondary | ICD-10-CM

## 2018-01-01 DIAGNOSIS — R8290 Unspecified abnormal findings in urine: Secondary | ICD-10-CM

## 2018-01-01 DIAGNOSIS — I10 Essential (primary) hypertension: Secondary | ICD-10-CM

## 2018-01-01 DIAGNOSIS — R9431 Abnormal electrocardiogram [ECG] [EKG]: Secondary | ICD-10-CM

## 2018-01-01 DIAGNOSIS — E1165 Type 2 diabetes mellitus with hyperglycemia: Secondary | ICD-10-CM

## 2018-01-01 LAB — GLUCOSE, POCT (MANUAL RESULT ENTRY)
POC Glucose: 336 mg/dl — AB (ref 70–99)
POC Glucose: 225 mg/dl — AB (ref 70–99)

## 2018-01-01 MED ORDER — CARVEDILOL 6.25 MG PO TABS: 6.2500 mg | ORAL_TABLET | Freq: Two times a day (BID) | ORAL | 3 refills | Status: DC

## 2018-01-01 MED ORDER — MUPIROCIN 2 % EX OINT: 1.0000 "application " | TOPICAL_OINTMENT | Freq: Three times a day (TID) | CUTANEOUS | 1 refills | Status: DC

## 2018-01-01 MED ORDER — CLONIDINE HCL 0.1 MG PO TABS
0.2000 mg | ORAL_TABLET | Freq: Once | ORAL | Status: AC
  Administered 2018-01-01: 0.2 mg via ORAL

## 2018-01-01 MED ORDER — METFORMIN HCL 500 MG PO TABS: 1000.0000 mg | ORAL_TABLET | Freq: Two times a day (BID) | ORAL | 2 refills | Status: DC

## 2018-01-01 MED ORDER — INSULIN ASPART 100 UNIT/ML ~~LOC~~ SOLN
15.0000 [IU] | Freq: Once | SUBCUTANEOUS | Status: AC
  Administered 2018-01-01: 15 [IU] via SUBCUTANEOUS

## 2018-01-01 MED ORDER — GLIPIZIDE 5 MG PO TABS: 2.5000 mg | ORAL_TABLET | Freq: Two times a day (BID) | ORAL | 2 refills | Status: DC

## 2018-01-01 NOTE — Patient Instructions (Addendum)
If blood sugar is consistently greater than 300, go to the Emergency department for further evaluation.  Return in 1 week, if no improvement, we will need to add insulin.  Start new blood pressure medication today, carvedilol 6.25 twice daily.

## 2018-01-01 NOTE — Progress Notes (Signed)
Patient ID: Stephanie [name], female    DOB: 1971-02-05, 47 y.o.   MRN: [id number]  PCP: Scot Jun, FNP  Chief Complaint  Patient presents with  . Blood Pressure Check    Subjective:  HPI Stephanie [name] is a 47 y.o. female presents for evaluation of diabetes. Barbie monitors glucose at home. Newly diagnosed with type 2 diabetes and started on Metformin 500 mg twice daily. During her last office over 1 week ago, glipizide was added to her regimen and metformin increased to 1,000 mg. Reports home readings have persistently remained in 300's. Endorses fatigue and urinary frequency. A1C 8.4. Blood sugar elevated today on arrival >300. Blood pressure elevated 175/118 Engages in no routine exercise and nor adheres to diabetes diet. She drinks alcohol routinely and is a current daily smoker.  Reports compliance with medication regimen although not seeing  any results with reduction of blood glucose of blood pressure.  He denies chest pains, shortness of breath, weakness, polyuria, or neuropathic pain.  Social History   Socioeconomic History  . Marital status: Single    Spouse name: Not on file  . Number of children: Not on file  . Years of education: Not on file  . Highest education level: Not on file  Occupational History  . Not on file  Social Needs  . Financial resource strain: Not on file  . Food insecurity:    Worry: Not on file    Inability: Not on file  . Transportation needs:    Medical: Not on file    Non-medical: Not on file  Tobacco Use  . Smoking status: Current Every Day Smoker    Packs/day: 0.50  . Smokeless tobacco: Never Used  Substance and Sexual Activity  . Alcohol use: Yes    Alcohol/week: 6.0 standard drinks    Types: 6 Cans of beer per week    Comment: daily  . Drug use: No  . Sexual activity: Not on file  Lifestyle  . Physical activity:    Days per week: Not on file    Minutes per session: Not on file  . Stress: Not on file   Relationships  . Social connections:    Talks on phone: Not on file    Gets together: Not on file    Attends religious service: Not on file    Active member of club or organization: Not on file    Attends meetings of clubs or organizations: Not on file    Relationship status: Not on file  . Intimate partner violence:    Fear of current or ex partner: Not on file    Emotionally abused: Not on file    Physically abused: Not on file    Forced sexual activity: Not on file  Other Topics Concern  . Not on file  Social History Narrative  . Not on file   Review of Systems Denies chest pain, headaches, new weakness, worsening cough, abdominal pain, edema, urinary retention, urinary frequency, wheezing,chest tightness,depression, suicidal ideations or auditory hallucinations. Patient Active Problem List   Diagnosis Date Noted  . Type 2 diabetes mellitus without complication, without long-term current use of insulin ([place]) 12/31/2017  . Elevated blood sugar 12/31/2017  . Accelerated hypertension 12/31/2017    No Known Allergies  Prior to Admission medications   Medication Sig Start Date End Date Taking? Authorizing Provider  amLODipine (NORVASC) 10 MG tablet Take 1 tablet (10 mg total) by mouth daily. 12/26/17   Scot Jun, FNP  blood glucose meter kit and supplies KIT Dispense based on patient and insurance preference. Use up to four times daily as directed. (FOR ICD-10 E.11.8). 12/27/17   Scot Jun, FNP  glipiZIDE (GLUCOTROL) 5 MG tablet Take 0.5 tablets (2.5 mg total) by mouth 2 (two) times daily before a meal. 01/01/18   Scot Jun, FNP  metFORMIN (GLUCOPHAGE) 500 MG tablet Take 2 tablets (1,000 mg total) by mouth 2 (two) times daily with a meal. 01/01/18   Scot Jun, FNP    Past Medical, Surgical Family and Social History reviewed and updated.    Objective:  There were no vitals filed for this visit.  Wt Readings from Last 3 Encounters:   12/26/17 150 lb 9.6 oz (68.3 kg)  11/18/17 155 lb (70.3 kg)  10/25/15 174 lb (78.9 kg)     Physical Exam General appearance: alert, well developed, well nourished, cooperative and in no distress Head: Normocephalic, without obvious abnormality, atraumatic Respiratory: Respirations even and unlabored, normal respiratory rate Heart: rate and rhythm normal. No gallop or murmurs noted on exam  Extremities: No gross deformities Skin: Skin color, texture, turgor normal. No rashes seen  Psych: Appropriate mood and affect. Neurologic: Mental status: Alert, oriented to person, place, and time, thought content appropriate.  Lab Results  Component Value Date   POCGLU 336 (A) 01/01/2018   POCGLU 261 (A) 12/26/2017    Lab Results  Component Value Date   HGBA1C 8.4 (H) 12/26/2017      Assessment & Plan:  1. Accelerated hypertension -Gave Clonidine 0.2 mg once in office  - EKG 12-Lead, indicates left atrial enlargement and diffuse non-specific T abnormality  Continue Amlodipine 10 mg once daily  Adding carvedilol 6.25 BID Return 1 week for BP check  Reduce sodium intake and tobacco cessation strongly recommended  2. Type 2 diabetes mellitus without complication, without long-term current use of insulin ([facility]) Recent A1C 8.4.CBG remain elevated.  - Glucose (CBG) on arrival 336. Novolog 10 units given in office  Adding Glipizide 2.5 mg twice daily -Return in 1 week with readings. If readings continue >200, will add Lantus 10 units daily   Meds ordered this encounter  Medications  . glipiZIDE (GLUCOTROL) 5 MG tablet    Sig: Take 0.5 tablets (2.5 mg total) by mouth 2 (two) times daily before a meal.    Dispense:  30 tablet    Refill:  2  . metFORMIN (GLUCOPHAGE) 500 MG tablet    Sig: Take 2 tablets (1,000 mg total) by mouth 2 (two) times daily with a meal.    Dispense:  60 tablet    Refill:  2    Patient will pick up when needed  . cloNIDine (CATAPRES) tablet 0.2 mg  . insulin  aspart (novoLOG) injection 15 Units  . carvedilol (COREG) 6.25 MG tablet    Sig: Take 1 tablet (6.25 mg total) by mouth 2 (two) times daily with a meal.    Dispense:  60 tablet    Refill:  3  . mupirocin ointment (BACTROBAN) 2 %    Sig: Apply 1 application topically 3 (three) times daily.    Dispense:  22 g    Refill:  1      -The patient was given clear instructions to go to ER or return to medical center if symptoms do not improve, worsen or new problems develop. The patient verbalized understanding.    Molli Barrows, FNP [facility] [address], [place]  [place] [phone]: [phone]

## 2018-01-02 LAB — URINE CULTURE

## 2018-01-07 ENCOUNTER — Other Ambulatory Visit: Payer: Self-pay

## 2018-01-08 ENCOUNTER — Ambulatory Visit: Payer: Self-pay | Admitting: Family Medicine

## 2018-01-09 ENCOUNTER — Encounter: Payer: Self-pay | Admitting: Family Medicine

## 2018-01-09 ENCOUNTER — Telehealth: Payer: Self-pay | Admitting: Family Medicine

## 2018-01-09 NOTE — Addendum Note (Signed)
Addended by: Bing NeighborsHARRIS, Toshua Honsinger S on: 01/09/2018 07:16 PM   Modules accepted: Orders

## 2018-01-09 NOTE — Telephone Encounter (Signed)
Erroneous

## 2018-01-10 MED FILL — AMLODIPINE BESYLATE 10 MG T: 10 | 30 days supply | Qty: 30 | Fill #0

## 2018-01-10 MED FILL — metFORMIN HCL 500 MG TABS: 500 | 30 days supply | Qty: 60 | Fill #0

## 2018-01-14 MED FILL — glipiZIDE 5 MG TABS: 5 | 30 days supply | Qty: 30 | Fill #0

## 2018-01-15 ENCOUNTER — Encounter: Payer: Self-pay | Admitting: Family Medicine

## 2018-01-15 ENCOUNTER — Ambulatory Visit (INDEPENDENT_AMBULATORY_CARE_PROVIDER_SITE_OTHER): Payer: Self-pay | Admitting: Family Medicine

## 2018-01-15 VITALS — BP 164/108 | HR 96 | Resp 17 | Ht 63.0 in | Wt 155.0 lb

## 2018-01-15 DIAGNOSIS — E119 Type 2 diabetes mellitus without complications: Secondary | ICD-10-CM

## 2018-01-15 DIAGNOSIS — I10 Essential (primary) hypertension: Secondary | ICD-10-CM

## 2018-01-15 DIAGNOSIS — E1165 Type 2 diabetes mellitus with hyperglycemia: Secondary | ICD-10-CM

## 2018-01-15 LAB — GLUCOSE, POCT (MANUAL RESULT ENTRY): POC Glucose: 224 mg/dl — AB (ref 70–99)

## 2018-01-15 MED ORDER — CLONIDINE HCL 0.1 MG PO TABS
0.1000 mg | ORAL_TABLET | Freq: Once | ORAL | Status: AC
  Administered 2018-01-15: 0.1 mg via ORAL

## 2018-01-15 MED ORDER — GLIPIZIDE 5 MG PO TABS: 5.0000 mg | ORAL_TABLET | Freq: Two times a day (BID) | ORAL | 2 refills | Status: DC

## 2018-01-15 MED ORDER — CARVEDILOL 6.25 MG PO TABS: 6.2500 mg | ORAL_TABLET | Freq: Two times a day (BID) | ORAL | 2 refills | Status: DC

## 2018-01-15 NOTE — Patient Instructions (Signed)
Take all medication as prescribed:   Meds ordered this encounter  Medications  . glipiZIDE (GLUCOTROL) 5 MG tablet    Sig: Take 1 tablet (5 mg total) by mouth 2 (two) times daily before a meal.    Dispense:  60 tablet    Refill:  2  . carvedilol (COREG) 6.25 MG tablet    Sig: Take 1 tablet (6.25 mg total) by mouth 2 (two) times daily with a meal.    Dispense:  60 tablet    Refill:  2   I'll see you back in 6 weeks.    Hypertension Hypertension is another name for high blood pressure. High blood pressure forces your heart to work harder to pump blood. This can cause problems over time. There are two numbers in a blood pressure reading. There is a top number (systolic) over a bottom number (diastolic). It is best to have a blood pressure below 120/80. Healthy choices can help lower your blood pressure. You may need medicine to help lower your blood pressure if:  Your blood pressure cannot be lowered with healthy choices.  Your blood pressure is higher than 130/80.  Follow these instructions at home: Eating and drinking  If directed, follow the DASH eating plan. This diet includes: ? Filling half of your plate at each meal with fruits and vegetables. ? Filling one quarter of your plate at each meal with whole grains. Whole grains include whole wheat pasta, brown rice, and whole grain bread. ? Eating or drinking low-fat dairy products, such as skim milk or low-fat yogurt. ? Filling one quarter of your plate at each meal with low-fat (lean) proteins. Low-fat proteins include fish, skinless chicken, eggs, beans, and tofu. ? Avoiding fatty meat, cured and processed meat, or chicken with skin. ? Avoiding premade or processed food.  Eat less than 1,500 mg of salt (sodium) a day.  Limit alcohol use to no more than 1 drink a day for nonpregnant women and 2 drinks a day for men. One drink equals 12 oz of beer, 5 oz of wine, or 1 oz of hard liquor. Lifestyle  Work with your doctor to  stay at a healthy weight or to lose weight. Ask your doctor what the best weight is for you.  Get at least 30 minutes of exercise that causes your heart to beat faster (aerobic exercise) most days of the week. This may include walking, swimming, or biking.  Get at least 30 minutes of exercise that strengthens your muscles (resistance exercise) at least 3 days a week. This may include lifting weights or pilates.  Do not use any products that contain nicotine or tobacco. This includes cigarettes and e-cigarettes. If you need help quitting, ask your doctor.  Check your blood pressure at home as told by your doctor.  Keep all follow-up visits as told by your doctor. This is important. Medicines  Take over-the-counter and prescription medicines only as told by your doctor. Follow directions carefully.  Do not skip doses of blood pressure medicine. The medicine does not work as well if you skip doses. Skipping doses also puts you at risk for problems.  Ask your doctor about side effects or reactions to medicines that you should watch for. Contact a doctor if:  You think you are having a reaction to the medicine you are taking.  You have headaches that keep coming back (recurring).  You feel dizzy.  You have swelling in your ankles.  You have trouble with your vision. Get  help right away if:  You get a very bad headache.  You start to feel confused.  You feel weak or numb.  You feel faint.  You get very bad pain in your: ? Chest. ? Belly (abdomen).  You throw up (vomit) more than once.  You have trouble breathing. Summary  Hypertension is another name for high blood pressure.  Making healthy choices can help lower blood pressure. If your blood pressure cannot be controlled with healthy choices, you may need to take medicine. This information is not intended to replace advice given to you by your health care provider. Make sure you discuss any questions you have with your  health care provider. Document Released: 07/11/2007 Document Revised: 12/21/2015 Document Reviewed: 12/21/2015 Elsevier Interactive Patient Education  [employer]2018 [employer]

## 2018-01-15 NOTE — Progress Notes (Signed)
Established Patient Office Visit  Subjective:  Patient ID: Stephanie [name], female    DOB: 1971/01/09  Age: 47 y.o. MRN: [id number]  CC:  Chief Complaint  Patient presents with  . Diabetes  . Hypertension    HPI [name] presents for follow-up of diabetes and hypertension.   Diabetes Recently seen in office and medication regimen was adjusted as patient had persistent readings > 300. She has checked her blood sugar since her last visit and readings have remained in low 200's. She was initially prescribed Glipizide 2.5 mg twice daily, however she has been taking 5 mg twice daily with readings in low 200's. Recent A1C 8.4. She admits to stressful week and feels this is related to an elevated BP and BS. She has also failed to start carvedilol for blood pressure. Denies chest pain, shortness of breath, weakness, or headache. Continues smoke daily. Not ready to quit at present. No routine physical exercise. Past Medical History:  Diagnosis Date  . Hypertension    Social History   Socioeconomic History  . Marital status: Single    Spouse name: Not on file  . Number of children: Not on file  . Years of education: Not on file  . Highest education level: Not on file  Occupational History  . Not on file  Social Needs  . Financial resource strain: Not on file  . Food insecurity:    Worry: Not on file    Inability: Not on file  . Transportation needs:    Medical: Not on file    Non-medical: Not on file  Tobacco Use  . Smoking status: Current Every Day Smoker    Packs/day: 0.50  . Smokeless tobacco: Never Used  Substance and Sexual Activity  . Alcohol use: Yes    Alcohol/week: 6.0 standard drinks    Types: 6 Cans of beer per week    Comment: daily  . Drug use: No  . Sexual activity: Not on file  Lifestyle  . Physical activity:    Days per week: Not on file    Minutes per session: Not on file  . Stress: Not on file  Relationships  . Social connections:    Talks  on phone: Not on file    Gets together: Not on file    Attends religious service: Not on file    Active member of club or organization: Not on file    Attends meetings of clubs or organizations: Not on file    Relationship status: Not on file  . Intimate partner violence:    Fear of current or ex partner: Not on file    Emotionally abused: Not on file    Physically abused: Not on file    Forced sexual activity: Not on file  Other Topics Concern  . Not on file  Social History Narrative  . Not on file    Outpatient Medications Prior to Visit  Medication Sig Dispense Refill  . amLODipine (NORVASC) 10 MG tablet Take 1 tablet (10 mg total) by mouth daily. 30 tablet 2  . carvedilol (COREG) 6.25 MG tablet Take 1 tablet (6.25 mg total) by mouth 2 (two) times daily with a meal. 60 tablet 3  . glipiZIDE (GLUCOTROL) 5 MG tablet Take 0.5 tablets (2.5 mg total) by mouth 2 (two) times daily before a meal. 30 tablet 2  . Glucose Blood (TRUE METRIX BLOOD GLUCOSE TEST VI)   0  . metFORMIN (GLUCOPHAGE) 500 MG tablet Take 2 tablets (1,000  mg total) by mouth 2 (two) times daily with a meal. 60 tablet 2  . mupirocin ointment (BACTROBAN) 2 % Apply 1 application topically 3 (three) times daily. 22 g 1  . TRUE METRIX BLOOD GLUCOSE TEST test strip   0  . TRUEPLUS LANCETS 28G MISC   0   No facility-administered medications prior to visit.     No Known Allergies  ROS Review of Systems Pertinent negatives listed in HPI   Objective:    Physical Exam  BP (!) 164/108   Pulse 96   Resp 17   Ht 5\' 3"  (1.6 m)   Wt 155 lb (70.3 kg)   SpO2 98%   BMI 27.46 kg/m  Wt Readings from Last 3 Encounters:  01/15/18 155 lb (70.3 kg)  12/26/17 150 lb 9.6 oz (68.3 kg)  11/18/17 155 lb (70.3 kg)     Health Maintenance Due  Topic Date Due  . PNEUMOCOCCAL POLYSACCHARIDE VACCINE AGE 43-64 HIGH RISK  04/02/1972  . FOOT EXAM  04/02/1980  . OPHTHALMOLOGY EXAM  04/02/1980  . URINE MICROALBUMIN  04/02/1980  .  HIV Screening  04/02/1985  . TETANUS/TDAP  04/02/1989  . PAP SMEAR  04/03/1991  . INFLUENZA VACCINE  09/05/2017    There are no preventive care reminders to display for this patient.  Lab Results  Component Value Date   TSH 1.299 11/18/2017   Lab Results  Component Value Date   WBC 7.3 12/14/2017   HGB 14.0 12/14/2017   HCT 41.9 12/14/2017   MCV 81.8 12/14/2017   PLT 265 12/14/2017   Lab Results  Component Value Date   NA 135 12/14/2017   K 4.0 12/14/2017   CO2 22 12/14/2017   GLUCOSE 309 (H) 12/14/2017   BUN 14 12/14/2017   CREATININE 1.06 (H) 12/14/2017   BILITOT 0.6 11/18/2017   ALKPHOS 78 11/18/2017   AST 25 11/18/2017   ALT 23 11/18/2017   PROT 7.0 11/18/2017   ALBUMIN 3.7 11/18/2017   CALCIUM 9.0 12/14/2017   ANIONGAP 10 12/14/2017   Lab Results  Component Value Date   HGBA1C 8.4 (H) 12/26/2017      Assessment & Plan:   Problem List Items Addressed This Visit      Cardiovascular and Mediastinum   Accelerated hypertension, take medication as prescribed. Continue to monitor BP at home. If readings are consistently greater than 160/90, return sooner for medication management.     Endocrine   Type 2 diabetes mellitus without complication, without long-term current use of insulin (HCC) - Primary, -Increased Glipizide 5 mg twice daily    Relevant Orders   Glucose (CBG) (Completed)      Meds ordered this encounter  Medications  . glipiZIDE (GLUCOTROL) 5 MG tablet    Sig: Take 1 tablet (5 mg total) by mouth 2 (two) times daily before a meal.    Dispense:  60 tablet    Refill:  2  . carvedilol (COREG) 6.25 MG tablet    Sig: Take 1 tablet (6.25 mg total) by mouth 2 (two) times daily with a meal.    Dispense:  60 tablet    Refill:  2  . cloNIDine (CATAPRES) tablet 0.1 mg    Follow-up: Return in about 6 weeks (around 02/26/2018) for hypertension follow-up .   Joaquin CourtsKimberly Jaimie Redditt, FNP [facility] [address], [place]  [place] [id number] [phone]: [phone]

## 2018-02-04 MED FILL — CARVEDILOL 6.25 MG TABLET: 6.25 | 30 days supply | Qty: 60 | Fill #0

## 2018-02-04 MED FILL — AMLODIPINE BESYLATE 10 MG T: 10 | 30 days supply | Qty: 30 | Fill #1

## 2018-02-14 MED FILL — glipiZIDE 5 MG TABS: 5 | 30 days supply | Qty: 30 | Fill #1

## 2018-02-26 ENCOUNTER — Encounter: Payer: Self-pay | Admitting: Family Medicine

## 2018-02-26 ENCOUNTER — Ambulatory Visit (INDEPENDENT_AMBULATORY_CARE_PROVIDER_SITE_OTHER): Payer: Self-pay | Admitting: Family Medicine

## 2018-02-26 VITALS — BP 137/91 | HR 88 | Resp 17 | Ht 63.0 in | Wt 153.8 lb

## 2018-02-26 DIAGNOSIS — E1159 Type 2 diabetes mellitus with other circulatory complications: Secondary | ICD-10-CM

## 2018-02-26 DIAGNOSIS — I10 Essential (primary) hypertension: Secondary | ICD-10-CM

## 2018-02-26 DIAGNOSIS — Z23 Encounter for immunization: Secondary | ICD-10-CM

## 2018-02-26 LAB — GLUCOSE, POCT (MANUAL RESULT ENTRY): POC Glucose: 140 mg/dl — AB (ref 70–99)

## 2018-02-26 NOTE — Progress Notes (Signed)
Established Patient Office Visit  Subjective:  Patient ID: Stephanie [name], female    DOB: 1970-12-30  Age: 48 y.o. MRN: [id number]  CC:  Chief Complaint  Patient presents with  . Hypertension    HPI Stephanie [name] presents for hypertension and diabetes management.  Hypertension/Diabetes  Reports compliance with medication. Significantly decreased salt. No added tablet salt. Walking daily. Continues to try to quit smoking. Glucose today 140 non-fasting.  At home readings have been consistently between 120 and 150. She is compliant with medication.  Denies any symptoms of hypoglycemia. Denies 3 P's. Denies symptoms of shortness of breath, headaches, chest pain, edema, new weakness. Current Body mass index is 27.24 kg/m. Endorses recently increasing physical activity.  Past Medical History:  Diagnosis Date  . Hypertension     No past surgical history on file.  No family history on file.  Social History   Socioeconomic History  . Marital status: Single    Spouse name: Not on file  . Number of children: Not on file  . Years of education: Not on file  . Highest education level: Not on file  Occupational History  . Not on file  Social Needs  . Financial resource strain: Not on file  . Food insecurity:    Worry: Not on file    Inability: Not on file  . Transportation needs:    Medical: Not on file    Non-medical: Not on file  Tobacco Use  . Smoking status: Current Every Day Smoker    Packs/day: 0.50  . Smokeless tobacco: Never Used  Substance and Sexual Activity  . Alcohol use: Yes    Alcohol/week: 6.0 standard drinks    Types: 6 Cans of beer per week    Comment: daily  . Drug use: No  . Sexual activity: Not on file  Lifestyle  . Physical activity:    Days per week: Not on file    Minutes per session: Not on file  . Stress: Not on file  Relationships  . Social connections:    Talks on phone: Not on file    Gets together: Not on file    Attends  religious service: Not on file    Active member of club or organization: Not on file    Attends meetings of clubs or organizations: Not on file    Relationship status: Not on file  . Intimate partner violence:    Fear of current or ex partner: Not on file    Emotionally abused: Not on file    Physically abused: Not on file    Forced sexual activity: Not on file  Other Topics Concern  . Not on file  Social History Narrative  . Not on file    Outpatient Medications Prior to Visit  Medication Sig Dispense Refill  . amLODipine (NORVASC) 10 MG tablet Take 1 tablet (10 mg total) by mouth daily. 30 tablet 2  . carvedilol (COREG) 6.25 MG tablet Take 1 tablet (6.25 mg total) by mouth 2 (two) times daily with a meal. 60 tablet 2  . glipiZIDE (GLUCOTROL) 5 MG tablet Take 1 tablet (5 mg total) by mouth 2 (two) times daily before a meal. 60 tablet 2  . Glucose Blood (TRUE METRIX BLOOD GLUCOSE TEST VI)   0  . metFORMIN (GLUCOPHAGE) 500 MG tablet Take 2 tablets (1,000 mg total) by mouth 2 (two) times daily with a meal. 60 tablet 2  . TRUE METRIX BLOOD GLUCOSE TEST test strip  0  . TRUEPLUS LANCETS 28G MISC   0  . mupirocin ointment (BACTROBAN) 2 % Apply 1 application topically 3 (three) times daily. 22 g 1   No facility-administered medications prior to visit.     No Known Allergies  ROS Review of Systems Pertinent negatives listed in HPI  Objective:    Physical Exam BP (!) 137/91   Pulse 88   Resp 17   Ht 5\' 3"  (1.6 m)   Wt 153 lb 12.8 oz (69.8 kg)   SpO2 95%   BMI 27.24 kg/m   General appearance: alert, well developed, well nourished, cooperative and in no distress Head: Normocephalic, without obvious abnormality, atraumatic Respiratory: Respirations even and unlabored, normal respiratory rate Heart: Regular rate, rhythm, no murmurs or gallops Extremities: No gross deformities Skin: Skin color, texture, turgor normal. No rashes seen  Psych: Appropriate mood and  affect. Neurologic: Mental status: Alert, oriented to person, place, and time, thought content appropriate.  Wt Readings from Last 3 Encounters:  02/26/18 153 lb 12.8 oz (69.8 kg)  01/15/18 155 lb (70.3 kg)  12/26/17 150 lb 9.6 oz (68.3 kg)     Health Maintenance Due  Topic Date Due  . PNEUMOCOCCAL POLYSACCHARIDE VACCINE AGE 11-64 HIGH RISK  04/02/1972  . FOOT EXAM  04/02/1980  . OPHTHALMOLOGY EXAM  04/02/1980  . URINE MICROALBUMIN  04/02/1980  . HIV Screening  04/02/1985  . TETANUS/TDAP  04/02/1989  . PAP SMEAR-Modifier  04/03/1991    There are no preventive care reminders to display for this patient.  Lab Results  Component Value Date   TSH 1.299 11/18/2017   Lab Results  Component Value Date   WBC 7.3 12/14/2017   HGB 14.0 12/14/2017   HCT 41.9 12/14/2017   MCV 81.8 12/14/2017   PLT 265 12/14/2017   Lab Results  Component Value Date   NA 135 12/14/2017   K 4.0 12/14/2017   CO2 22 12/14/2017   GLUCOSE 309 (H) 12/14/2017   BUN 14 12/14/2017   CREATININE 1.06 (H) 12/14/2017   BILITOT 0.6 11/18/2017   ALKPHOS 78 11/18/2017   AST 25 11/18/2017   ALT 23 11/18/2017   PROT 7.0 11/18/2017   ALBUMIN 3.7 11/18/2017   CALCIUM 9.0 12/14/2017   ANIONGAP 10 12/14/2017    Lab Results  Component Value Date   HGBA1C 8.4 (H) 12/26/2017      Assessment & Plan:  1. Essential hypertension, controlled today. We have discussed target BP range and blood pressure goal. I have advised patient to check BP regularly and to call us back or report to clinic if the numbers are consistently higher than 140/90. We discussed the importance of compliance with medical therapy and DASH diet recommended, consequences of uncontrolled hypertension discussed.  - continue current BP medications   2. Type 2 diabetes mellitus with other circulatory complication, without long-term current use of insulin (HCC) Aim for 30 minutes of exercise most days, with a goal of 150 minutes per  week. -Glucose monitoring at minimal of twice daily and keep a log of readings. -Commit to medication adherence and self-adjustment as needed -increase foods containing whole grains (one-half of grain intake). -saturated fat intake should be reduced -reduce intake of trans fat (lowers LDL cholesterol and increases HDL cholesterol) -Eat 4-5 small meals during the day to reduce the risk of becoming hungry. Checking: - Hemoglobin A1c - Glucose (CBG) - Microalbumin/Creatinine Ratio, Urine   Orders Placed This Encounter  Procedures  . Tdap vaccine greater than  or equal to 7yo IM  . Pneumococcal polysaccharide vaccine 23-valent greater than or equal to 2yo subcutaneous/IM  . Hemoglobin A1c  . Microalbumin/Creatinine Ratio, Urine  . Glucose (CBG)     Follow-up: Return in about 3 months (around 05/28/2018) for Complete Physical Exam-DM-HTN w/ fasting labs /PAP .    Joaquin Courts, FNP Primary Care at [facility] [address] [place] [address] [phone]: [phone]

## 2018-02-26 NOTE — Patient Instructions (Signed)
Preventive Care 40-64 Years, Female Preventive care refers to lifestyle choices and visits with your health care provider that can promote health and wellness. What does preventive care include?   A yearly physical exam. This is also called an annual well check.  Dental exams once or twice a year.  Routine eye exams. Ask your health care provider how often you should have your eyes checked.  Personal lifestyle choices, including: ? Daily care of your teeth and gums. ? Regular physical activity. ? Eating a healthy diet. ? Avoiding tobacco and drug use. ? Limiting alcohol use. ? Practicing safe sex. ? Taking low-dose aspirin daily starting at age 48. ? Taking vitamin and mineral supplements as recommended by your health care provider. What happens during an annual well check? The services and screenings done by your health care provider during your annual well check will depend on your age, overall health, lifestyle risk factors, and family history of disease. Counseling Your health care provider may ask you questions about your:  Alcohol use.  Tobacco use.  Drug use.  Emotional well-being.  Home and relationship well-being.  Sexual activity.  Eating habits.  Work and work environment.  Method of birth control.  Menstrual cycle.  Pregnancy history. Screening You may have the following tests or measurements:  Height, weight, and BMI.  Blood pressure.  Lipid and cholesterol levels. These may be checked every 5 years, or more frequently if you are over 48 years old.  Skin check.  Lung cancer screening. You may have this screening every year starting at age 48 if you have a 30-pack-year history of smoking and currently smoke or have quit within the past 15 years.  Colorectal cancer screening. All adults should have this screening starting at age 48 and continuing until age 75. Your health care provider may recommend screening at age 45. You will have tests every  1-10 years, depending on your results and the type of screening test. People at increased risk should start screening at an earlier age. Screening tests may include: ? Guaiac-based fecal occult blood testing. ? Fecal immunochemical test (FIT). ? Stool DNA test. ? Virtual colonoscopy. ? Sigmoidoscopy. During this test, a flexible tube with a tiny camera (sigmoidoscope) is used to examine your rectum and lower colon. The sigmoidoscope is inserted through your anus into your rectum and lower colon. ? Colonoscopy. During this test, a long, thin, flexible tube with a tiny camera (colonoscope) is used to examine your entire colon and rectum.  Hepatitis C blood test.  Hepatitis B blood test.  Sexually transmitted disease (STD) testing.  Diabetes screening. This is done by checking your blood sugar (glucose) after you have not eaten for a while (fasting). You may have this done every 1-3 years.  Mammogram. This may be done every 1-2 years. Talk to your health care provider about when you should start having regular mammograms. This may depend on whether you have a family history of breast cancer.  BRCA-related cancer screening. This may be done if you have a family history of breast, ovarian, tubal, or peritoneal cancers.  Pelvic exam and Pap test. This may be done every 3 years starting at age 48. Starting at age 48, this may be done every 5 years if you have a Pap test in combination with an HPV test.  Bone density scan. This is done to screen for osteoporosis. You may have this scan if you are at high risk for osteoporosis. Discuss your test results, treatment options,   age 30, this may be done every 5 years if you have a Pap test in combination with an HPV test.   Bone density scan. This is done to screen for osteoporosis. You may have this scan if you are at high risk for osteoporosis.  Discuss your test results, treatment options, and if necessary, the need for more tests with your health care provider.  Vaccines  Your health care provider may recommend certain vaccines, such as:   Influenza vaccine. This is recommended every year.   Tetanus, diphtheria, and acellular pertussis (Tdap, Td) vaccine. You may need a Td booster every 10 years.   Varicella  vaccine. You may need this if you have not been vaccinated.   Zoster vaccine. You may need this after age 60.   Measles, mumps, and rubella (MMR) vaccine. You may need at least one dose of MMR if you were born in 1957 or later. You may also need a second dose.   Pneumococcal 13-valent conjugate (PCV13) vaccine. You may need this if you have certain conditions and were not previously vaccinated.   Pneumococcal polysaccharide (PPSV23) vaccine. You may need one or two doses if you smoke cigarettes or if you have certain conditions.   Meningococcal vaccine. You may need this if you have certain conditions.   Hepatitis A vaccine. You may need this if you have certain conditions or if you travel or work in places where you may be exposed to hepatitis A.   Hepatitis B vaccine. You may need this if you have certain conditions or if you travel or work in places where you may be exposed to hepatitis B.   Haemophilus influenzae type b (Hib) vaccine. You may need this if you have certain conditions.  Talk to your health care provider about which screenings and vaccines you need and how often you need them.  This information is not intended to replace advice given to you by your health care provider. Make sure you discuss any questions you have with your health care provider.  Document Released: 02/18/2015 Document Revised: 03/14/2017 Document Reviewed: 11/23/2014  Elsevier Interactive Patient Education  2019 [employer]    DASH Eating Plan  DASH stands for "Dietary Approaches to Stop Hypertension." The DASH eating plan is a healthy eating plan that has been shown to reduce high blood pressure (hypertension). It may also reduce your risk for type 2 diabetes, heart disease, and stroke. The DASH eating plan may also help with weight loss.  What are tips for following this plan?    General guidelines   Avoid eating more than 2,300 mg (milligrams) of salt (sodium) a day. If you have hypertension, you may need to reduce  your sodium intake to 1,500 mg a day.   Limit alcohol intake to no more than 1 drink a day for nonpregnant women and 2 drinks a day for men. One drink equals 12 oz of beer, 5 oz of wine, or 1 oz of hard liquor.   Work with your health care provider to maintain a healthy body weight or to lose weight. Ask what an ideal weight is for you.   Get at least 30 minutes of exercise that causes your heart to beat faster (aerobic exercise) most days of the week. Activities may include walking, swimming, or biking.   Work with your health care provider or diet and nutrition specialist (dietitian) to adjust your eating plan to your individual calorie needs.  Reading food labels     Check   food labels for the amount of sodium per serving. Choose foods with less than 5 percent of the Daily Value of sodium. Generally, foods with less than 300 mg of sodium per serving fit into this eating plan.   To find whole grains, look for the word "whole" as the first word in the ingredient list.  Shopping   Buy products labeled as "low-sodium" or "no salt added."   Buy fresh foods. Avoid canned foods and premade or frozen meals.  Cooking   Avoid adding salt when cooking. Use salt-free seasonings or herbs instead of table salt or sea salt. Check with your health care provider or pharmacist before using salt substitutes.   Do not fry foods. Cook foods using healthy methods such as baking, boiling, grilling, and broiling instead.   Cook with heart-healthy oils, such as olive, canola, soybean, or sunflower oil.  Meal planning   Eat a balanced diet that includes:  ? 5 or more servings of fruits and vegetables each day. At each meal, try to fill half of your plate with fruits and vegetables.  ? Up to 6-8 servings of whole grains each day.  ? Less than 6 oz of lean meat, poultry, or fish each day. A 3-oz serving of meat is about the same size as a deck of cards. One egg equals 1 oz.  ? 2 servings of low-fat dairy each day.  ? A serving  of nuts, seeds, or beans 5 times each week.  ? Heart-healthy fats. Healthy fats called Omega-3 fatty acids are found in foods such as flaxseeds and coldwater fish, like sardines, salmon, and mackerel.   Limit how much you eat of the following:  ? Canned or prepackaged foods.  ? Food that is high in trans fat, such as fried foods.  ? Food that is high in saturated fat, such as fatty meat.  ? Sweets, desserts, sugary drinks, and other foods with added sugar.  ? Full-fat dairy products.   Do not salt foods before eating.   Try to eat at least 2 vegetarian meals each week.   Eat more home-cooked food and less restaurant, buffet, and fast food.   When eating at a restaurant, ask that your food be prepared with less salt or no salt, if possible.  What foods are recommended?  The items listed may not be a complete list. Talk with your dietitian about what dietary choices are best for you.  Grains  Whole-grain or whole-wheat bread. Whole-grain or whole-wheat pasta. Brown rice. Oatmeal. Quinoa. Bulgur. Whole-grain and low-sodium cereals. Pita bread. Low-fat, low-sodium crackers. Whole-wheat flour tortillas.  Vegetables  Fresh or frozen vegetables (raw, steamed, roasted, or grilled). Low-sodium or reduced-sodium tomato and vegetable juice. Low-sodium or reduced-sodium tomato sauce and tomato paste. Low-sodium or reduced-sodium canned vegetables.  Fruits  All fresh, dried, or frozen fruit. Canned fruit in natural juice (without added sugar).  Meat and other protein foods  Skinless chicken or turkey. Ground chicken or turkey. Pork with fat trimmed off. Fish and seafood. Egg whites. Dried beans, peas, or lentils. Unsalted nuts, nut butters, and seeds. Unsalted canned beans. Lean cuts of beef with fat trimmed off. Low-sodium, lean deli meat.  Dairy  Low-fat (1%) or fat-free (skim) milk. Fat-free, low-fat, or reduced-fat cheeses. Nonfat, low-sodium ricotta or cottage cheese. Low-fat or nonfat yogurt. Low-fat, low-sodium  cheese.  Fats and oils  Soft margarine without trans fats. Vegetable oil. Low-fat, reduced-fat, or light mayonnaise and salad dressings (reduced-sodium). Canola, safflower, olive, soybean,   and sunflower oils. Avocado.  Seasoning and other foods  Herbs. Spices. Seasoning mixes without salt. Unsalted popcorn and pretzels. Fat-free sweets.  What foods are not recommended?  The items listed may not be a complete list. Talk with your dietitian about what dietary choices are best for you.  Grains  Baked goods made with fat, such as croissants, muffins, or some breads. Dry pasta or rice meal packs.  Vegetables  Creamed or fried vegetables. Vegetables in a cheese sauce. Regular canned vegetables (not low-sodium or reduced-sodium). Regular canned tomato sauce and paste (not low-sodium or reduced-sodium). Regular tomato and vegetable juice (not low-sodium or reduced-sodium). Pickles. Olives.  Fruits  Canned fruit in a light or heavy syrup. Fried fruit. Fruit in cream or butter sauce.  Meat and other protein foods  Fatty cuts of meat. Ribs. Fried meat. Bacon. Sausage. Bologna and other processed lunch meats. Salami. Fatback. Hotdogs. Bratwurst. Salted nuts and seeds. Canned beans with added salt. Canned or smoked fish. Whole eggs or egg yolks. Chicken or turkey with skin.  Dairy  Whole or 2% milk, cream, and half-and-half. Whole or full-fat cream cheese. Whole-fat or sweetened yogurt. Full-fat cheese. Nondairy creamers. Whipped toppings. Processed cheese and cheese spreads.  Fats and oils  Butter. Stick margarine. Lard. Shortening. Ghee. Bacon fat. Tropical oils, such as coconut, palm kernel, or palm oil.  Seasoning and other foods  Salted popcorn and pretzels. Onion salt, garlic salt, seasoned salt, table salt, and sea salt. Worcestershire sauce. Tartar sauce. Barbecue sauce. Teriyaki sauce. Soy sauce, including reduced-sodium. Steak sauce. Canned and packaged gravies. Fish sauce. Oyster sauce. Cocktail sauce. Horseradish  that you find on the shelf. Ketchup. Mustard. Meat flavorings and tenderizers. Bouillon cubes. Hot sauce and Tabasco sauce. Premade or packaged marinades. Premade or packaged taco seasonings. Relishes. Regular salad dressings.  Where to find more information:   National Heart, Lung, and Blood Institute: www.nhlbi.nih.gov   American Heart Association: www.heart.org  Summary   The DASH eating plan is a healthy eating plan that has been shown to reduce high blood pressure (hypertension). It may also reduce your risk for type 2 diabetes, heart disease, and stroke.   With the DASH eating plan, you should limit salt (sodium) intake to 2,300 mg a day. If you have hypertension, you may need to reduce your sodium intake to 1,500 mg a day.   When on the DASH eating plan, aim to eat more fresh fruits and vegetables, whole grains, lean proteins, low-fat dairy, and heart-healthy fats.   Work with your health care provider or diet and nutrition specialist (dietitian) to adjust your eating plan to your individual calorie needs.  This information is not intended to replace advice given to you by your health care provider. Make sure you discuss any questions you have with your health care provider.  Document Released: 01/11/2011 Document Revised: 01/16/2016 Document Reviewed: 01/16/2016  Elsevier Interactive Patient Education  2019 [employer] Inc.

## 2018-02-27 LAB — HEMOGLOBIN A1C
ESTIMATED AVERAGE GLUCOSE: 140 mg/dL
Hgb A1c MFr Bld: 6.5 % — ABNORMAL HIGH (ref 4.8–5.6)

## 2018-02-28 NOTE — Progress Notes (Signed)
Patient notified of results & recommendations. Expressed understanding.

## 2018-03-17 MED FILL — AMLODIPINE BESYLATE 10 MG T: 10 | 30 days supply | Qty: 30 | Fill #2

## 2018-03-17 MED FILL — glipiZIDE 5 MG TABS: 5 | 30 days supply | Qty: 30 | Fill #2

## 2018-04-13 DIAGNOSIS — R9431 Abnormal electrocardiogram [ECG] [EKG]: Secondary | ICD-10-CM | POA: Insufficient documentation

## 2018-04-13 NOTE — Progress Notes (Deleted)
Cardiology Office Note    Date:  04/13/2018   ID:  Stephanie [name], DOB October 21, 1970, MRN [id number]  PCP:  Bing Neighbors, FNP  Cardiologist:  Armanda Magic, MD   No chief complaint on file.   History of Present Illness:  Stephanie [name] is a 48 y.o. female who is being seen today for the evaluation of abnormal EKG at the request of Bing Neighbors, FNP.  This is a very pleasant 48 year old female with a history of hypertension and diabetes mellitus who was recently found to have an abnormal EKG showing anterior infarct on 01/01/2018 when she presented to [facility] emergency room with complaints of back pain and hematuria.  Her blood pressure was also markedly elevated at that time and was treated for hypertensive urgency with 2 doses of IV labetalol.  She was treated for a UTI and then followed back up with her PCP.  She has chronic problems with hypertension which is somewhat been poorly controlled as well as diabetes mellitus.  She is here today for followup and is doing well.  She denies any chest pain or pressure, SOB, DOE, PND, orthopnea, LE edema, dizziness, palpitations or syncope. She is compliant with her meds and is tolerating meds with no SE.    Past Medical History:  Diagnosis Date  . Hypertension     No past surgical history on file.  Current Medications: No outpatient medications have been marked as taking for the 04/14/18 encounter (Appointment) with Quintella Reichert, MD.    Allergies:   Patient has no known allergies.   Social History   Socioeconomic History  . Marital status: Single    Spouse name: Not on file  . Number of children: Not on file  . Years of education: Not on file  . Highest education level: Not on file  Occupational History  . Not on file  Social Needs  . Financial resource strain: Not on file  . Food insecurity:    Worry: Not on file    Inability: Not on file  . Transportation needs:    Medical: Not on file   Non-medical: Not on file  Tobacco Use  . Smoking status: Current Every Day Smoker    Packs/day: 0.50  . Smokeless tobacco: Never Used  Substance and Sexual Activity  . Alcohol use: Yes    Alcohol/week: 6.0 standard drinks    Types: 6 Cans of beer per week    Comment: daily  . Drug use: No  . Sexual activity: Not on file  Lifestyle  . Physical activity:    Days per week: Not on file    Minutes per session: Not on file  . Stress: Not on file  Relationships  . Social connections:    Talks on phone: Not on file    Gets together: Not on file    Attends religious service: Not on file    Active member of club or organization: Not on file    Attends meetings of clubs or organizations: Not on file    Relationship status: Not on file  Other Topics Concern  . Not on file  Social History Narrative  . Not on file     Family History:  The patient's family history is not on file.   ROS:   Please see the history of present illness.    ROS All other systems reviewed and are negative.  No flowsheet data found.     PHYSICAL EXAM:  VS:  There were no vitals taken for this visit.   GEN: Well nourished, well developed, in no acute distress  HEENT: normal  Neck: no JVD, carotid bruits, or masses Cardiac: RRR; no murmurs, rubs, or gallops,no edema.  Intact distal pulses bilaterally.  Respiratory:  clear to auscultation bilaterally, normal work of breathing GI: soft, nontender, nondistended, + BS MS: no deformity or atrophy  Skin: warm and dry, no rash Neuro:  Alert and Oriented x 3, Strength and sensation are intact Psych: euthymic mood, full affect  Wt Readings from Last 3 Encounters:  02/26/18 153 lb 12.8 oz (69.8 kg)  01/15/18 155 lb (70.3 kg)  12/26/17 150 lb 9.6 oz (68.3 kg)      Studies/Labs Reviewed:   EKG:  EKG is ordered today.  The ekg ordered today demonstrates ***  Recent Labs: 11/18/2017: ALT 23; TSH 1.299 12/14/2017: BUN 14; Creatinine, Ser 1.06; Hemoglobin  14.0; Platelets 265; Potassium 4.0; Sodium 135   Lipid Panel No results found for: CHOL, TRIG, HDL, CHOLHDL, VLDL, LDLCALC, LDLDIRECT  Additional studies/ records that were reviewed today include:  EF notes from 12/2017 and PCP notes from 2020.    ASSESSMENT:    1. Abnormal EKG   2. Essential hypertension   3. Type 2 diabetes mellitus without complication, without long-term current use of insulin (HCC)      PLAN:  In order of problems listed above:  1.  Abnormal EKG -her EKG back in the ER in November showed normal sinus rhythm with possible old anterior infarct.  This is likely due to lead placement.  She has not had any symptoms of chest pain or shortness of breath.  She does smoke and has hypertension and diabetes and therefore does have cardiac risk factors.  Recommend getting a 2D echo to assess for wall motion normalities and EF.  I will also set her up for a exercise stress echo as well to assess for ischemia.  She would benefit from a chest CT for calcium scoring.  This will help assess future risk.  2.  Hypertension - BPis controlled on exam today.  She will continue on amlodipine 10 mg daily and carvedilol 6.25 mg twice daily.  3.  Diabetes mellitus -followed by her PCP.  She takes metformin and glipizide.  Given cardiac risk factors would likely benefit from at least twice weekly statin therapy.  I will leave this up to her PCP.   Medication Adjustments/Labs and Tests Ordered: Current medicines are reviewed at length with the patient today.  Concerns regarding medicines are outlined above.  Medication changes, Labs and Tests ordered today are listed in the Patient Instructions below.  There are no Patient Instructions on file for this visit.   Signed, Armanda Magic, MD  04/13/2018 6:29 PM    [facility] [address] [place], [place], [place]  [address] Phone: [phone]; Fax: [phone]

## 2018-04-14 ENCOUNTER — Ambulatory Visit: Payer: Self-pay | Admitting: Cardiology

## 2018-04-15 ENCOUNTER — Encounter: Payer: Self-pay | Admitting: Cardiology

## 2018-04-16 ENCOUNTER — Other Ambulatory Visit: Payer: Self-pay | Admitting: Family Medicine

## 2018-04-16 MED FILL — glipiZIDE 5 MG TABS: 5 | 30 days supply | Qty: 60 | Fill #0

## 2018-04-16 MED FILL — CARVEDILOL 6.25 MG TABLET: 6.25 | 30 days supply | Qty: 60 | Fill #1

## 2018-04-16 MED FILL — metFORMIN HCL 500 MG TABS: 500 | 30 days supply | Qty: 60 | Fill #1

## 2018-05-09 MED FILL — AMLODIPINE BESYLATE 10 MG T: 10 | 30 days supply | Qty: 30 | Fill #0

## 2018-05-28 ENCOUNTER — Ambulatory Visit: Payer: Self-pay | Admitting: Family Medicine

## 2018-06-13 ENCOUNTER — Ambulatory Visit: Payer: Self-pay | Admitting: Family Medicine

## 2018-07-02 MED FILL — AMLODIPINE BESYLATE 10 MG T: 10 | 30 days supply | Qty: 30 | Fill #1

## 2018-07-02 MED FILL — glipiZIDE 5 MG TABS: 5 | 30 days supply | Qty: 60 | Fill #1

## 2018-07-02 MED FILL — metFORMIN HCL 500 MG TABS: 500 | 30 days supply | Qty: 60 | Fill #2

## 2018-07-02 MED FILL — CARVEDILOL 6.25 MG TABLET: 6.25 | 30 days supply | Qty: 60 | Fill #2

## 2018-07-04 ENCOUNTER — Telehealth: Payer: Self-pay

## 2018-07-04 NOTE — Telephone Encounter (Signed)
Called and left message for patient to return call. Need to setup upcoming appointment with Dr Mayford Knife as virtual visit and get verbal consent.

## 2018-07-07 ENCOUNTER — Ambulatory Visit: Payer: Self-pay | Admitting: Cardiology

## 2018-07-07 ENCOUNTER — Telehealth: Payer: Self-pay | Admitting: *Deleted

## 2018-07-07 NOTE — Telephone Encounter (Signed)

## 2018-07-07 NOTE — Progress Notes (Deleted)
Per pt everything is fine with her Blood Sugar. Per pt she was last seen around December.

## 2018-07-08 ENCOUNTER — Ambulatory Visit (INDEPENDENT_AMBULATORY_CARE_PROVIDER_SITE_OTHER): Payer: Self-pay | Admitting: Family Medicine

## 2018-07-08 ENCOUNTER — Encounter: Payer: Self-pay | Admitting: Family Medicine

## 2018-07-08 ENCOUNTER — Other Ambulatory Visit: Payer: Self-pay

## 2018-07-08 VITALS — BP 155/98 | HR 85 | Temp 98.1°F | Ht 63.0 in | Wt 153.4 lb

## 2018-07-08 DIAGNOSIS — L02411 Cutaneous abscess of right axilla: Secondary | ICD-10-CM

## 2018-07-08 DIAGNOSIS — E1159 Type 2 diabetes mellitus with other circulatory complications: Secondary | ICD-10-CM

## 2018-07-08 DIAGNOSIS — L02412 Cutaneous abscess of left axilla: Secondary | ICD-10-CM

## 2018-07-08 DIAGNOSIS — E1165 Type 2 diabetes mellitus with hyperglycemia: Secondary | ICD-10-CM

## 2018-07-08 DIAGNOSIS — L02419 Cutaneous abscess of limb, unspecified: Secondary | ICD-10-CM

## 2018-07-08 DIAGNOSIS — I10 Essential (primary) hypertension: Secondary | ICD-10-CM

## 2018-07-08 MED ORDER — SULFAMETHOXAZOLE-TRIMETHOPRIM 800-160 MG PO TABS: 1.0000 | ORAL_TABLET | Freq: Two times a day (BID) | ORAL | 0 refills | Status: DC

## 2018-07-08 MED ORDER — MUPIROCIN 2 % EX OINT: 1.0000 "application " | TOPICAL_OINTMENT | Freq: Three times a day (TID) | CUTANEOUS | 2 refills | Status: DC

## 2018-07-08 MED FILL — ?SULFAMETHOXAZOLE-TMP DS TB: 800-160 | 10 days supply | Qty: 20 | Fill #0

## 2018-07-08 MED FILL — MUPIROCIN 2% OINTMENT: 2 | 10 days supply | Qty: 66 | Fill #0

## 2018-07-08 NOTE — Progress Notes (Deleted)
Patient ID: Stephanie [name], female    DOB: [birth year], 48 y.o.   MRN: [id number]  PCP: Bing Neighbors, FNP  Chief Complaint  Patient presents with  . Hypertension  . Diabetes    Subjective:  HPI  Stephanie [name] is a 48 y.o. female presents for evaluation   has Type 2 diabetes mellitus without complication, without long-term current use of insulin (HCC); Elevated blood sugar; Essential hypertension; and Abnormal EKG on their problem list.   Today's visit:  Social History   Socioeconomic History  . Marital status: Single    Spouse name: Not on file  . Number of children: Not on file  . Years of education: Not on file  . Highest education level: Not on file  Occupational History  . Not on file  Social Needs  . Financial resource strain: Not on file  . Food insecurity:    Worry: Not on file    Inability: Not on file  . Transportation needs:    Medical: Not on file    Non-medical: Not on file  Tobacco Use  . Smoking status: Current Every Day Smoker    Packs/day: 0.50  . Smokeless tobacco: Never Used  Substance and Sexual Activity  . Alcohol use: Yes    Alcohol/week: 6.0 standard drinks    Types: 6 Cans of beer per week    Comment: daily  . Drug use: No  . Sexual activity: Not on file  Lifestyle  . Physical activity:    Days per week: Not on file    Minutes per session: Not on file  . Stress: Not on file  Relationships  . Social connections:    Talks on phone: Not on file    Gets together: Not on file    Attends religious service: Not on file    Active member of club or organization: Not on file    Attends meetings of clubs or organizations: Not on file    Relationship status: Not on file  . Intimate partner violence:    Fear of current or ex partner: Not on file    Emotionally abused: Not on file    Physically abused: Not on file    Forced sexual activity: Not on file  Other Topics Concern  . Not on file  Social History Narrative  . Not on  file    History reviewed. No pertinent family history.   Review of Systems  No Known Allergies  Prior to Admission medications   Medication Sig Start Date End Date Taking? Authorizing Provider  amLODipine (NORVASC) 10 MG tablet TAKE 1 TABLET (10 MG TOTAL) BY MOUTH DAILY. 04/16/18  Yes Bing Neighbors, FNP  carvedilol (COREG) 6.25 MG tablet Take 1 tablet (6.25 mg total) by mouth 2 (two) times daily with a meal. 01/15/18  Yes Bing Neighbors, FNP  glipiZIDE (GLUCOTROL) 5 MG tablet Take 1 tablet (5 mg total) by mouth 2 (two) times daily before a meal. 01/15/18  Yes Bing Neighbors, FNP  Glucose Blood (TRUE METRIX BLOOD GLUCOSE TEST VI)  12/31/17  Yes [provider]  metFORMIN (GLUCOPHAGE) 500 MG tablet Take 2 tablets (1,000 mg total) by mouth 2 (two) times daily with a meal. 01/01/18  Yes Bing Neighbors, FNP  TRUE METRIX BLOOD GLUCOSE TEST test strip  12/31/17  Yes [provider]  TRUEPLUS LANCETS 28G MISC  12/31/17  Yes [provider]    Past Medical, Surgical Family and Social History reviewed  and updated.    Objective:   Today's Vitals   07/08/18 1547 07/08/18 1551 07/08/18 1552  BP: (!) 161/101 (!) 155/98   Pulse: 85 85   Temp: 98.1 F (36.7 C)    TempSrc: Oral    SpO2: 96%    Weight: 153 lb 6.4 oz (69.6 kg)    Height: 5\' 3"  (1.6 m)    PainSc: 0-No pain  0-No pain    BP Readings from Last 3 Encounters:  07/08/18 (!) 155/98  02/26/18 (!) 137/91  01/15/18 (!) 164/108    Filed Weights   07/08/18 1547  Weight: 153 lb 6.4 oz (69.6 kg)       Physical Exam General appearance: alert, well developed, well nourished, cooperative and in no distress Head: Normocephalic, without obvious abnormality, atraumatic Respiratory: Respirations even and unlabored, normal respiratory rate Heart: rate and rhythm normal. No gallop or murmurs noted on exam  Extremities: No gross deformities Skin: Skin color, texture, turgor normal. No rashes  seen  Psych: Appropriate mood and affect. Neurologic: Mental status: Alert, oriented to person, place, and time, thought content appropriate.  Lab Results  Component Value Date   POCGLU 140 (A) 02/26/2018   POCGLU 224 (A) 01/15/2018   POCGLU 225 (A) 01/01/2018    Lab Results  Component Value Date   HGBA1C 6.5 (H) 02/26/2018            Assessment & Plan:  1. Essential hypertension *** - Comprehensive metabolic panel  2. Type 2 diabetes mellitus with other circulatory complication, without long-term current use of insulin (HCC) *** - Microalbumin/Creatinine Ratio, Urine - Hemoglobin A1c   Joaquin CourtsKimberly Lenee Franze, FNP [facility] [address], [place] [id number] [phone]: [phone]

## 2018-07-08 NOTE — Patient Instructions (Signed)

## 2018-07-09 ENCOUNTER — Other Ambulatory Visit: Payer: Self-pay | Admitting: Family Medicine

## 2018-07-09 LAB — COMPREHENSIVE METABOLIC PANEL
ALT: 24 IU/L (ref 0–32)
AST: 17 IU/L (ref 0–40)
Albumin/Globulin Ratio: 1.6 (ref 1.2–2.2)
Albumin: 4.4 g/dL (ref 3.8–4.8)
Alkaline Phosphatase: 89 IU/L (ref 39–117)
BUN/Creatinine Ratio: 10 (ref 9–23)
BUN: 9 mg/dL (ref 6–24)
Bilirubin Total: 0.3 mg/dL (ref 0.0–1.2)
CO2: 19 mmol/L — ABNORMAL LOW (ref 20–29)
Calcium: 9.3 mg/dL (ref 8.7–10.2)
Chloride: 98 mmol/L (ref 96–106)
Creatinine, Ser: 0.87 mg/dL (ref 0.57–1.00)
GFR calc Af Amer: 91 mL/min/{1.73_m2} (ref >59)
GFR calc non Af Amer: 79 mL/min/{1.73_m2} (ref >59)
Globulin, Total: 2.7 g/dL (ref 1.5–4.5)
Glucose: 345 mg/dL — ABNORMAL HIGH (ref 65–99)
Potassium: 4.5 mmol/L (ref 3.5–5.2)
Sodium: 137 mmol/L (ref 134–144)
Total Protein: 7.1 g/dL (ref 6.0–8.5)

## 2018-07-09 LAB — HEMOGLOBIN A1C
Est. average glucose Bld gHb Est-mCnc: 180 mg/dL
Hgb A1c MFr Bld: 7.9 % — ABNORMAL HIGH (ref 4.8–5.6)

## 2018-07-09 LAB — MICROALBUMIN / CREATININE URINE RATIO
Creatinine, Urine: 150.1 mg/dL
Microalb/Creat Ratio: 789 mg/g creat — ABNORMAL HIGH (ref 0–29)
Microalbumin, Urine: 1183.7 ug/mL

## 2018-07-09 NOTE — Telephone Encounter (Signed)
Notify patient: A1C increased from 6.5 to 7.9. Increasing glipizide 10 mg (2 tablets) with breakfast and glipizide 5 mg at dinner.   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 Adding lisinopril 10 mg once daily for kidney protection and improvement of blood pressure. Adding atorvastatin 10 mg once daily to reduce risk of heart attack or stroke.   Continue all other medications. Make efforts improve  control of blood sugar and blood pressure. Schedule follow-up for 3 months and will need to come fasting for cholesterol check.

## 2018-07-10 MED ORDER — ATORVASTATIN CALCIUM 10 MG PO TABS: 10.0000 mg | ORAL_TABLET | Freq: Every day | ORAL | 3 refills | Status: DC

## 2018-07-10 MED ORDER — GLIPIZIDE 5 MG PO TABS: ORAL_TABLET | ORAL | 2 refills | Status: DC

## 2018-07-10 MED ORDER — LISINOPRIL 10 MG PO TABS: 10.0000 mg | ORAL_TABLET | Freq: Every day | ORAL | 3 refills | Status: DC

## 2018-07-10 MED FILL — LISINOPRIL 10 MG TABS: 10 | 30 days supply | Qty: 30 | Fill #0

## 2018-07-10 MED FILL — ?ATORVASTATIN 10 MG TABLET: 10 | 30 days supply | Qty: 30 | Fill #0

## 2018-07-10 NOTE — Telephone Encounter (Signed)
Spoke with patient about lab results & recommendations. Expressed understanding. Is agreeable to all medication changes. Prescriptions sent to [facility]. Patient had an appointment already scheduled for 10/08/2018 @ 3:10 pm but appointment was moved up to 10:30 am so that we could repeat fasting labs.

## 2018-07-12 NOTE — Progress Notes (Signed)
Established Patient Office Visit  Subjective:  Patient ID: Stephanie [name], female    DOB: 1970/10/05  Age: 48 y.o. MRN: [id number]  CC:  Chief Complaint  Patient presents with  . Hypertension  . Diabetes    HPI Stephanie [name] presents for hypertension and diabetes management.  Hypertension/Diabetes  She does not monitor her blood pressure at home.  Reports compliance with medication. Significantly decreased salt. No added tablet salt. Walking daily. Continues to try to quit smoking. Has not reduced the number of cigarettes smoked per day. At home readings have been consistently between 120 and 150. She is compliant with medication.  Denies any symptoms of hypoglycemia. Denies 3 P's. Denies symptoms of shortness of breath, headaches, chest pain, edema, new weakness. Current Body mass index is 27.17 kg/m.   Boils Patient complains of recurrent boils under both arms and axillary region.  She reports that trying in the office and have her follow-up.  She denies chills, nausea, vomiting, or fever.  No previous diagnosis of hydradenitis Past Medical History:  Diagnosis Date  . Hypertension     Social History   Socioeconomic History  . Marital status: Single    Spouse name: Not on file  . Number of children: Not on file  . Years of education: Not on file  . Highest education level: Not on file  Occupational History  . Not on file  Social Needs  . Financial resource strain: Not on file  . Food insecurity:    Worry: Not on file    Inability: Not on file  . Transportation needs:    Medical: Not on file    Non-medical: Not on file  Tobacco Use  . Smoking status: Current Every Day Smoker    Packs/day: 0.50  . Smokeless tobacco: Never Used  Substance and Sexual Activity  . Alcohol use: Yes    Alcohol/week: 6.0 standard drinks    Types: 6 Cans of beer per week    Comment: daily  . Drug use: No  . Sexual activity: Not on file  Lifestyle  . Physical activity:   Days per week: Not on file    Minutes per session: Not on file  . Stress: Not on file  Relationships  . Social connections:    Talks on phone: Not on file    Gets together: Not on file    Attends religious service: Not on file    Active member of club or organization: Not on file    Attends meetings of clubs or organizations: Not on file    Relationship status: Not on file  . Intimate partner violence:    Fear of current or ex partner: Not on file    Emotionally abused: Not on file    Physically abused: Not on file    Forced sexual activity: Not on file  Other Topics Concern  . Not on file  Social History Narrative  . Not on file    Outpatient Medications Prior to Visit  Medication Sig Dispense Refill  . amLODipine (NORVASC) 10 MG tablet TAKE 1 TABLET (10 MG TOTAL) BY MOUTH DAILY. 30 tablet 2  . carvedilol (COREG) 6.25 MG tablet Take 1 tablet (6.25 mg total) by mouth 2 (two) times daily with a meal. 60 tablet 2  . Glucose Blood (TRUE METRIX BLOOD GLUCOSE TEST VI)   0  . metFORMIN (GLUCOPHAGE) 500 MG tablet Take 2 tablets (1,000 mg total) by mouth 2 (two) times daily with a meal.  60 tablet 2  . TRUE METRIX BLOOD GLUCOSE TEST test strip   0  . TRUEPLUS LANCETS 28G MISC   0  . glipiZIDE (GLUCOTROL) 5 MG tablet Take 1 tablet (5 mg total) by mouth 2 (two) times daily before a meal. 60 tablet 2   No facility-administered medications prior to visit.     No Known Allergies  ROS Review of Systems Pertinent negatives listed in HPI  Objective:    Physical Exam BP (!) 155/98 (BP Location: Right Arm, Patient Position: Sitting, Cuff Size: Normal)   Pulse 85   Temp 98.1 F (36.7 C) (Oral)   Ht 5\' 3"  (1.6 m)   Wt 153 lb 6.4 oz (69.6 kg)   SpO2 96%   BMI 27.17 kg/m   General appearance: alert, well developed, well nourished, cooperative and in no distress Head: Normocephalic, without obvious abnormality, atraumatic Respiratory: Respirations even and unlabored, normal respiratory  rate Heart: Regular rate, rhythm, no murmurs or gallops Extremities: No gross deformities Skin: Skin color, texture, turgor normal. No rashes seen  Psych: Appropriate mood and affect. Neurologic: Mental status: Alert, oriented to person, place, and time, thought content appropriate.  Wt Readings from Last 3 Encounters:  07/08/18 153 lb 6.4 oz (69.6 kg)  02/26/18 153 lb 12.8 oz (69.8 kg)  01/15/18 155 lb (70.3 kg)     Health Maintenance Due  Topic Date Due  . FOOT EXAM  04/02/1980  . OPHTHALMOLOGY EXAM  04/02/1980  . HIV Screening  04/02/1985  . PAP SMEAR-Modifier  04/03/1991    There are no preventive care reminders to display for this patient.  Lab Results  Component Value Date   TSH 1.299 11/18/2017   Lab Results  Component Value Date   WBC 7.3 12/14/2017   HGB 14.0 12/14/2017   HCT 41.9 12/14/2017   MCV 81.8 12/14/2017   PLT 265 12/14/2017   Lab Results  Component Value Date   NA 137 07/08/2018   K 4.5 07/08/2018   CO2 19 (L) 07/08/2018   GLUCOSE 345 (H) 07/08/2018   BUN 9 07/08/2018   CREATININE 0.87 07/08/2018   BILITOT 0.3 07/08/2018   ALKPHOS 89 07/08/2018   AST 17 07/08/2018   ALT 24 07/08/2018   PROT 7.1 07/08/2018   ALBUMIN 4.4 07/08/2018   CALCIUM 9.3 07/08/2018   ANIONGAP 10 12/14/2017    Lab Results  Component Value Date   HGBA1C 7.9 (H) 07/08/2018      Assessment & Plan:  1. Essential hypertension, controlled today. We have discussed target BP range and blood pressure goal. I have advised patient to check BP regularly and to call us back or report to clinic if the numbers are consistently higher than 140/90. We discussed the importance of compliance with medical therapy and DASH diet recommended, consequences of uncontrolled hypertension discussed.  - continue current BP medications -Checking CMP  2. Type 2 diabetes mellitus with other circulatory complication, without long-term current use of insulin (HCC) Aim for 30 minutes of  exercise most days, with a goal of 150 minutes per week. -Glucose monitoring at minimal of twice daily and keep a log of readings. -Commit to medication adherence and self-adjustment as needed -increase foods containing whole grains (one-half of grain intake). -saturated fat intake should be reduced -reduce intake of trans fat (lowers LDL cholesterol and increases HDL cholesterol) -Eat 4-5 small meals during the day to reduce the risk of becoming hungry. Checking: - Hemoglobin A1c -Urine microabumin   3. Abscess of  axilla -Apply mupirocin ointment 3 times daily to the axilla area Start Bactrim 800/160 mg twice daily for 10 days.   If symptoms continue to recur please follow-up for possible referral to general surgery or in office I&D   Meds ordered this encounter  Medications  . sulfamethoxazole-trimethoprim (BACTRIM DS) 800-160 MG tablet    Sig: Take 1 tablet by mouth 2 (two) times daily.    Dispense:  20 tablet    Refill:  0  . mupirocin ointment (BACTROBAN) 2 %    Sig: Apply 1 application topically 3 (three) times daily.    Dispense:  60 g    Refill:  2    Orders Placed This Encounter  Procedures  . Microalbumin/Creatinine Ratio, Urine  . Hemoglobin A1c  . Comprehensive metabolic panel     Follow-up: Return in about 3 months (around 10/08/2018) for diabetes and hypertension .    Joaquin CourtsKimberly Vickie Melnik, FNP [facility] [address], [place] [id number] [phone]: [phone]

## 2018-09-26 ENCOUNTER — Other Ambulatory Visit: Payer: Self-pay | Admitting: Family Medicine

## 2018-09-26 MED FILL — ?METFORMIN HCL 500MG TABLET: 500 | 30 days supply | Qty: 120 | Fill #0

## 2018-09-26 MED FILL — LISINOPRIL 10 MG TABS: 10 | 30 days supply | Qty: 30 | Fill #1

## 2018-09-26 MED FILL — ?AMLODIPINE BESYLATE 10 MG: 10 | 30 days supply | Qty: 30 | Fill #2

## 2018-09-26 MED FILL — ?ATORVASTATIN 10 MG TABLET: 10 | 30 days supply | Qty: 30 | Fill #1

## 2018-09-26 MED FILL — ?GLIPIZIDE 5MG TABLET: 5 | 30 days supply | Qty: 90 | Fill #0

## 2018-09-29 MED FILL — ?CARVEDILOL 6.25 MG TABLET: 6.25 | 30 days supply | Qty: 60 | Fill #0

## 2018-10-07 ENCOUNTER — Telehealth: Payer: Self-pay

## 2018-10-07 NOTE — Telephone Encounter (Signed)

## 2018-10-08 ENCOUNTER — Other Ambulatory Visit: Payer: Self-pay

## 2018-10-08 ENCOUNTER — Ambulatory Visit (INDEPENDENT_AMBULATORY_CARE_PROVIDER_SITE_OTHER): Payer: Self-pay | Admitting: Nurse Practitioner

## 2018-10-08 VITALS — BP 148/92 | HR 87 | Temp 97.3°F | Wt 147.0 lb

## 2018-10-08 DIAGNOSIS — E1159 Type 2 diabetes mellitus with other circulatory complications: Secondary | ICD-10-CM

## 2018-10-08 DIAGNOSIS — D649 Anemia, unspecified: Secondary | ICD-10-CM

## 2018-10-08 DIAGNOSIS — I10 Essential (primary) hypertension: Secondary | ICD-10-CM

## 2018-10-08 LAB — GLUCOSE, POCT (MANUAL RESULT ENTRY): POC Glucose: 320 mg/dL — AB (ref 70–99)

## 2018-10-08 MED ORDER — LISINOPRIL 20 MG PO TABS: 20.0000 mg | ORAL_TABLET | Freq: Every day | ORAL | 0 refills | Status: DC

## 2018-10-08 MED ORDER — GLIPIZIDE 5 MG PO TABS: ORAL_TABLET | ORAL | 2 refills | Status: DC

## 2018-10-08 MED FILL — LISINOPRIL 20 MG TABLET: 20 | 30 days supply | Qty: 30 | Fill #0

## 2018-10-08 NOTE — Progress Notes (Addendum)
Assessment & Plan:  Stephanie [name] was seen today for diabetes, hypertension and hyperlipidemia.  Diagnoses and all orders for this visit:  Type 2 diabetes mellitus with other circulatory complication, without long-term current use of insulin (HCC) -     Glucose (CBG) -     Hemoglobin A1c -     Lipid Panel -     glipiZIDE (GLUCOTROL) 5 MG tablet; Take 10 mg (2 tablets) with breakfast and 5 mg with dinner. Continue blood sugar control as discussed in office today, low carbohydrate diet, and regular physical exercise as tolerated, 150 minutes per week (30 min each day, 5 days per week, or 50 min 3 days per week). Keep blood sugar logs with fasting goal of 90-130 mg/dl, post prandial (after you eat) less than 180.  For Hypoglycemia: BS <60 and Hyperglycemia BS >400; contact the clinic ASAP. Annual eye exams and foot exams are recommended.  Patient refused 10units of insulin per office policy for BS > 696 <295   Essential hypertension -     lisinopril (ZESTRIL) 20 MG tablet; Take 1 tablet (20 mg total) by mouth daily. Continue all antihypertensives as prescribed.  Remember to bring in your blood pressure log with you for your follow up appointment.  DASH/Mediterranean Diets are healthier choices for HTN.   ENCOURAGED TO STOP SMOKING  Anemia, unspecified type -     CBC    Patient has been counseled on age-appropriate routine health concerns for screening and prevention. These are reviewed and up-to-date. Referrals have been placed accordingly. Immunizations are up-to-date or declined.    Subjective:   Chief Complaint  Patient presents with  . Diabetes  . Hypertension  . Hyperlipidemia   HPI Stephanie [name] 48 y.o. female presents to office today for follow up. She has not taken any of her medications this morning.  She states she takes her blood pressure medicines and diabetes medicines in the afternoon.  She does have carvedilol scheduled twice a day and a inquired as to what  time she takes her second dose of carvedilol and she stated "before I go to bed".  She works at [employer] and states she has to get to work by 6 AM and she cannot take any of her medications on an empty stomach.   Hypertension She is not exercising and is not adherent to low salt diet.  She does not have a blood pressure log  today.  Blood pressure is not well controlled at home.  Denies chest pain, shortness of breath, palpitations, lightheadedness, dizziness, headaches or BLE edema.   Cardiovascular risk factors: diabetes mellitus, hypertension and smoking/ tobacco exposure. Use of agents associated with hypertension: none.  History of target organ damage: none. BP Readings from Last 3 Encounters:  10/08/18 (!) 148/92  07/08/18 (!) 155/98  02/26/18 (!) 137/91     Diabetes Mellitus Type II Current symptoms/problems include hyperglycemia and paresthesia of the feet and have been unchanged.  Current diabetic medications include: Metformin 1000 mg twice daily and glipizide 10 mg am ad 5 mg pm Eye exam current (within one year): no Weight trend: decreasing Prior visit with dietician: no Current monitoring regimen: none Home blood sugar records: she does not monitor her blood glucose readings Any episodes of hypoglycemia? no Is She on ACE inhibitor or angiotensin II receptor blocker?  Yes  Lab Results  Component Value Date   HGBA1C 7.9 (H) 07/08/2018   HGBA1C 6.5 (H) 02/26/2018   HGBA1C 8.4 (H) 12/26/2017  ROS  Past Medical History:  Diagnosis Date  . Diabetes mellitus without complication (HCC)   . Hypertension     No past surgical history on file.  No family history on file.  Social History Reviewed with no changes to be made today.   Outpatient Medications Prior to Visit  Medication Sig Dispense Refill  . amLODipine (NORVASC) 10 MG tablet TAKE 1 TABLET (10 MG TOTAL) BY MOUTH DAILY. 30 tablet 2  . atorvastatin (LIPITOR) 10 MG tablet Take 1 tablet (10 mg total) by  mouth daily. 90 tablet 3  . carvedilol (COREG) 6.25 MG tablet TAKE 1 TABLET (6.25 MG TOTAL) BY MOUTH 2 (TWO) TIMES DAILY WITH A MEAL. 60 tablet 2  . Glucose Blood (TRUE METRIX BLOOD GLUCOSE TEST VI)   0  . metFORMIN (GLUCOPHAGE) 500 MG tablet Take 2 tablets (1,000 mg total) by mouth 2 (two) times daily with a meal. 60 tablet 2  . TRUE METRIX BLOOD GLUCOSE TEST test strip   0  . TRUEPLUS LANCETS 28G MISC   0  . glipiZIDE (GLUCOTROL) 5 MG tablet Take 10 mg (2 tablets) with breakfast and 5 mg with dinner. 270 tablet 2  . lisinopril (ZESTRIL) 10 MG tablet Take 1 tablet (10 mg total) by mouth daily. 90 tablet 3   No facility-administered medications prior to visit.     No Known Allergies     Objective:    BP (!) 148/92   Pulse 87   Temp (!) 97.3 F (36.3 C) (Temporal)   Wt 147 lb (66.7 kg)   SpO2 98%   BMI 26.04 kg/m  Wt Readings from Last 3 Encounters:  10/08/18 147 lb (66.7 kg)  07/08/18 153 lb 6.4 oz (69.6 kg)  02/26/18 153 lb 12.8 oz (69.8 kg)    Physical Exam       Patient has been counseled extensively about nutrition and exercise as well as the importance of adherence with medications and regular follow-up. The patient was given clear instructions to go to ER or return to medical center if symptoms don't improve, worsen or new problems develop. The patient verbalized understanding.   Follow-up: Return for schedule pap and then 3 months DM.   Claiborne RiggZelda W Keliyah Spillman, FNP-BC [facility] [place], [place] [phone]   10/08/2018, 10:48 AM

## 2018-10-08 NOTE — Progress Notes (Signed)
Doesn't check FSBS at home. Denies nausea, vomiting, numbness/tingling in feet, polyuria, polydipsia. 

## 2018-10-09 LAB — CBC
Hematocrit: 41.4 % (ref 34.0–46.6)
Hemoglobin: 14.2 g/dL (ref 11.1–15.9)
MCH: 30 pg (ref 26.6–33.0)
MCHC: 34.3 g/dL (ref 31.5–35.7)
MCV: 87 fL (ref 79–97)
Platelets: 257 10*3/uL (ref 150–450)
RBC: 4.74 x10E6/uL (ref 3.77–5.28)
RDW: 15.5 % — ABNORMAL HIGH (ref 11.7–15.4)
WBC: 6.2 10*3/uL (ref 3.4–10.8)

## 2018-10-09 LAB — LIPID PANEL
Chol/HDL Ratio: 4.4 ratio (ref 0.0–4.4)
Cholesterol, Total: 145 mg/dL (ref 100–199)
HDL: 33 mg/dL — ABNORMAL LOW (ref >39)
LDL Chol Calc (NIH): 55 mg/dL (ref 0–99)
Triglycerides: 377 mg/dL — ABNORMAL HIGH (ref 0–149)
VLDL Cholesterol Cal: 57 mg/dL — ABNORMAL HIGH (ref 5–40)

## 2018-10-09 LAB — HEMOGLOBIN A1C
Est. average glucose Bld gHb Est-mCnc: 194 mg/dL
Hgb A1c MFr Bld: 8.4 % — ABNORMAL HIGH (ref 4.8–5.6)

## 2018-10-13 ENCOUNTER — Other Ambulatory Visit: Payer: Self-pay | Admitting: Nurse Practitioner

## 2018-10-13 MED ORDER — CARVEDILOL 6.25 MG PO TABS: 6.2500 mg | ORAL_TABLET | Freq: Two times a day (BID) | ORAL | 1 refills | Status: DC

## 2018-10-13 MED ORDER — TRUEPLUS LANCETS 28G MISC: 2 refills | Status: AC

## 2018-10-13 MED ORDER — METFORMIN HCL 500 MG PO TABS: 1000.0000 mg | ORAL_TABLET | Freq: Two times a day (BID) | ORAL | 1 refills | Status: DC

## 2018-10-13 MED ORDER — AMLODIPINE BESYLATE 10 MG PO TABS: 10.0000 mg | ORAL_TABLET | Freq: Every day | ORAL | 1 refills | Status: DC

## 2018-10-13 MED ORDER — TRUE METRIX BLOOD GLUCOSE TEST VI STRP: ORAL_STRIP | 1 refills | Status: AC

## 2018-10-13 MED ORDER — ATORVASTATIN CALCIUM 10 MG PO TABS: 10.0000 mg | ORAL_TABLET | Freq: Every day | ORAL | 3 refills | Status: DC

## 2018-10-14 ENCOUNTER — Encounter: Payer: Self-pay | Admitting: Nurse Practitioner

## 2018-10-14 ENCOUNTER — Other Ambulatory Visit: Payer: Self-pay | Admitting: Nurse Practitioner

## 2018-10-14 ENCOUNTER — Telehealth: Payer: Self-pay | Admitting: *Deleted

## 2018-10-14 MED ORDER — LOSARTAN POTASSIUM 50 MG PO TABS: 50.0000 mg | ORAL_TABLET | Freq: Every day | ORAL | 3 refills | Status: DC

## 2018-10-14 MED FILL — ?CARVEDILOL 6.25 MG TABLET: 6.25 | 30 days supply | Qty: 60 | Fill #0

## 2018-10-14 MED FILL — TRUE METRIX TEST STRIP: 50 days supply | Qty: 100 | Fill #0

## 2018-10-14 MED FILL — TRUEplus LANCETS 28G MISC: 50 days supply | Qty: 100 | Fill #0

## 2018-10-14 NOTE — Telephone Encounter (Signed)
Pt name and DOB verified. Patient aware of results and result note per Geryl Rankins, FNP. Patient c/o a bad cough that she has had for a short while since taking medications. Noticed patient is taking Lisinopril. Will send notification to Geryl Rankins, FNP  Pt denies SOB, throat swelling or tingling and/ swelling in her lips.

## 2018-10-14 NOTE — Telephone Encounter (Signed)
S/W patient. She is aware to stop taking lisinopril 20 mg and will start losartan 50mg  in addition to current medications: amlodipine and carvedilol

## 2018-10-15 MED FILL — LOSARTAN POTASSIUM 50 MG TA: 50 | 30 days supply | Qty: 30 | Fill #0

## 2018-10-21 ENCOUNTER — Ambulatory Visit: Payer: Self-pay

## 2018-11-10 ENCOUNTER — Telehealth: Payer: Self-pay

## 2018-11-10 NOTE — Telephone Encounter (Signed)

## 2018-11-11 ENCOUNTER — Ambulatory Visit: Payer: Self-pay

## 2018-12-01 ENCOUNTER — Telehealth: Payer: Self-pay

## 2018-12-01 NOTE — Telephone Encounter (Signed)
Called patient to do their pre-visit COVID screening.  Call went to voicemail. Unable to do prescreening.  

## 2018-12-02 ENCOUNTER — Ambulatory Visit: Payer: Self-pay

## 2019-01-06 ENCOUNTER — Telehealth: Payer: Self-pay

## 2019-01-06 NOTE — Telephone Encounter (Signed)

## 2019-01-07 ENCOUNTER — Ambulatory Visit: Payer: Self-pay

## 2019-01-27 ENCOUNTER — Ambulatory Visit (INDEPENDENT_AMBULATORY_CARE_PROVIDER_SITE_OTHER): Payer: Self-pay | Admitting: Physician Assistant

## 2019-01-27 DIAGNOSIS — E1159 Type 2 diabetes mellitus with other circulatory complications: Secondary | ICD-10-CM

## 2019-01-27 DIAGNOSIS — I10 Essential (primary) hypertension: Secondary | ICD-10-CM

## 2019-01-27 DIAGNOSIS — E1140 Type 2 diabetes mellitus with diabetic neuropathy, unspecified: Secondary | ICD-10-CM

## 2019-01-27 MED ORDER — AMLODIPINE BESYLATE 10 MG PO TABS: 10.0000 mg | ORAL_TABLET | Freq: Every day | ORAL | 1 refills | Status: DC

## 2019-01-27 MED ORDER — METFORMIN HCL 500 MG PO TABS: 1000.0000 mg | ORAL_TABLET | Freq: Two times a day (BID) | ORAL | 1 refills | Status: DC

## 2019-01-27 MED ORDER — LOSARTAN POTASSIUM 50 MG PO TABS: 50.0000 mg | ORAL_TABLET | Freq: Every day | ORAL | 3 refills | Status: DC

## 2019-01-27 MED ORDER — GABAPENTIN 300 MG PO CAPS: 300.0000 mg | ORAL_CAPSULE | Freq: Every day | ORAL | 3 refills | Status: DC

## 2019-01-27 MED ORDER — ATORVASTATIN CALCIUM 10 MG PO TABS: 10.0000 mg | ORAL_TABLET | Freq: Every day | ORAL | 3 refills | Status: DC

## 2019-01-27 MED ORDER — CARVEDILOL 6.25 MG PO TABS: 6.2500 mg | ORAL_TABLET | Freq: Two times a day (BID) | ORAL | 1 refills | Status: DC

## 2019-01-27 MED ORDER — GLIPIZIDE 5 MG PO TABS: ORAL_TABLET | ORAL | 2 refills | Status: DC

## 2019-01-27 MED FILL — ?AMLODIPINE BESYLATE 10 MG: 10 | 30 days supply | Qty: 30 | Fill #0

## 2019-01-27 MED FILL — LOSARTAN POTASSIUM 50 MG TA: 50 | 30 days supply | Qty: 30 | Fill #0

## 2019-01-27 MED FILL — ?METFORMIN HCL 500MG TABLET: 500 | 30 days supply | Qty: 120 | Fill #0

## 2019-01-27 MED FILL — GABAPENTIN 300 MG CAPSULE: 300 | 30 days supply | Qty: 30 | Fill #0

## 2019-01-27 MED FILL — ?CARVEDILOL 6.25 MG TABLET: 6.25 | 30 days supply | Qty: 60 | Fill #0

## 2019-01-27 MED FILL — ?ATORVASTATIN 10 MG TABLET: 10 | 30 days supply | Qty: 30 | Fill #0

## 2019-01-27 MED FILL — ?GLIPIZIDE 5MG TABLET: 5 | 30 days supply | Qty: 90 | Fill #0

## 2019-01-27 NOTE — Progress Notes (Signed)
Virtual Visit via Telephone Note  I connected with [name] on 01/27/19 at  3:50 PM EST by telephone and verified that I am speaking with the correct person using two identifiers.   I discussed the limitations, risks, security and privacy concerns of performing an evaluation and management service by telephone and the availability of in person appointments. I also discussed with the patient that there may be a patient responsible charge related to this service. The patient expressed understanding and agreed to proceed.  Patient location:  home My Location:  [place] Persons on the call:  Me and the patient    History of Present Illness:  Blood sugars running  120-150.  Compliant with meds.  No hyper/hypoglycemia.  She denies CP/SOB.  She is having some burning  And itching in her feet bothering her esp at night.  No other concerns today.      Observations/Objective:  NAD.  A&Ox3   Assessment and Plan: 1. Type 2 diabetes mellitus with other circulatory complication, without long-term current use of insulin ([place]) -work on diabetic diet given home readings and continue current regimen.  She does not want to have blood work at this time - atorvastatin (LIPITOR) 10 MG tablet; Take 1 tablet (10 mg total) by mouth daily.  Dispense: 90 tablet; Refill: 3 - glipiZIDE (GLUCOTROL) 5 MG tablet; Take 10 mg (2 tablets) with breakfast and 5 mg with dinner.  Dispense: 270 tablet; Refill: 2 - metFORMIN (GLUCOPHAGE) 500 MG tablet; Take 2 tablets (1,000 mg total) by mouth 2 (two) times daily with a meal.  Dispense: 360 tablet; Refill: 1  2. Essential hypertension - amLODipine (NORVASC) 10 MG tablet; Take 1 tablet (10 mg total) by mouth daily.  Dispense: 90 tablet; Refill: 1 - metFORMIN (GLUCOPHAGE) 500 MG tablet; Take 2 tablets (1,000 mg total) by mouth 2 (two) times daily with a meal.  Dispense: 360 tablet; Refill: 1  3. Type 2 diabetes mellitus with diabetic neuropathy, unspecified whether  long term insulin use (HCC)  - gabapentin (NEURONTIN) 300 MG capsule; Take 1 capsule (300 mg total) by mouth at bedtime.  Dispense: 90 capsule; Refill: 3    Follow Up Instructions: See PCP in ~8 weeks;  Sooner if needed   I discussed the assessment and treatment plan with the patient. The patient was provided an opportunity to ask questions and all were answered. The patient agreed with the plan and demonstrated an understanding of the instructions.   The patient was advised to call back or seek an in-person evaluation if the symptoms worsen or if the condition fails to improve as anticipated.  I provided 9 minutes of non-face-to-face time during this encounter.   Stephanie Caldron, PA-C  Patient ID: Stephanie [name], female   DOB: 1970-11-21, 48 y.o.   MRN: [id number]

## 2019-03-31 ENCOUNTER — Ambulatory Visit: Payer: Self-pay | Admitting: Internal Medicine

## 2019-04-06 ENCOUNTER — Telehealth: Payer: Self-pay

## 2019-04-06 NOTE — Telephone Encounter (Signed)

## 2019-04-07 ENCOUNTER — Ambulatory Visit (INDEPENDENT_AMBULATORY_CARE_PROVIDER_SITE_OTHER): Payer: Self-pay | Admitting: Internal Medicine

## 2019-04-07 ENCOUNTER — Other Ambulatory Visit: Payer: Self-pay

## 2019-04-07 ENCOUNTER — Encounter: Payer: Self-pay | Admitting: Internal Medicine

## 2019-04-07 VITALS — BP 207/133 | HR 88 | Temp 97.2°F | Resp 17 | Wt 143.2 lb

## 2019-04-07 DIAGNOSIS — I10 Essential (primary) hypertension: Secondary | ICD-10-CM

## 2019-04-07 DIAGNOSIS — E782 Mixed hyperlipidemia: Secondary | ICD-10-CM

## 2019-04-07 DIAGNOSIS — E1159 Type 2 diabetes mellitus with other circulatory complications: Secondary | ICD-10-CM

## 2019-04-07 LAB — POCT GLYCOSYLATED HEMOGLOBIN (HGB A1C): Hemoglobin A1C: 9.1 % — AB (ref 4.0–5.6)

## 2019-04-07 LAB — GLUCOSE, POCT (MANUAL RESULT ENTRY): POC Glucose: 402 mg/dl — AB (ref 70–99)

## 2019-04-07 MED ORDER — GABAPENTIN 300 MG PO CAPS: 300.0000 mg | ORAL_CAPSULE | Freq: Every day | ORAL | 3 refills | Status: DC

## 2019-04-07 MED ORDER — AMLODIPINE BESYLATE 10 MG PO TABS: 10.0000 mg | ORAL_TABLET | Freq: Every day | ORAL | 1 refills | Status: DC

## 2019-04-07 MED ORDER — CLONIDINE HCL 0.1 MG PO TABS
0.3000 mg | ORAL_TABLET | Freq: Once | ORAL | Status: AC
  Administered 2019-04-07: 0.3 mg via ORAL

## 2019-04-07 MED ORDER — ATORVASTATIN CALCIUM 10 MG PO TABS: 10.0000 mg | ORAL_TABLET | Freq: Every day | ORAL | 3 refills | Status: DC

## 2019-04-07 MED ORDER — LOSARTAN POTASSIUM 50 MG PO TABS: 50.0000 mg | ORAL_TABLET | Freq: Every day | ORAL | 3 refills | Status: DC

## 2019-04-07 MED ORDER — GLIPIZIDE 5 MG PO TABS: ORAL_TABLET | ORAL | 2 refills | Status: DC

## 2019-04-07 MED ORDER — METFORMIN HCL 500 MG PO TABS: 1000.0000 mg | ORAL_TABLET | Freq: Two times a day (BID) | ORAL | 1 refills | Status: DC

## 2019-04-07 MED ORDER — CARVEDILOL 6.25 MG PO TABS: 6.2500 mg | ORAL_TABLET | Freq: Two times a day (BID) | ORAL | 1 refills | Status: DC

## 2019-04-07 MED FILL — ?METFORMIN HCL 500MG TABLET: 500 | 30 days supply | Qty: 120 | Fill #0

## 2019-04-07 MED FILL — ?GLIPIZIDE 5MG TABLET: 5 | 30 days supply | Qty: 90 | Fill #0

## 2019-04-07 MED FILL — GABAPENTIN 300 MG CAPSULE: 300 | 30 days supply | Qty: 30 | Fill #0

## 2019-04-07 MED FILL — AMLODIPINE BESYLATE 10 MG T: 10 | 30 days supply | Qty: 30 | Fill #0

## 2019-04-07 MED FILL — LOSARTAN POTASSIUM 50 MG TA: 50 | 30 days supply | Qty: 30 | Fill #0

## 2019-04-07 MED FILL — ?CARVEDILOL 6.25 MG TABLET: 6.25 | 30 days supply | Qty: 60 | Fill #0

## 2019-04-07 MED FILL — ?ATORVASTATIN CALCIUM 10MG: 10 | 30 days supply | Qty: 30 | Fill #0

## 2019-04-07 NOTE — Progress Notes (Signed)
Subjective:    Stephanie [name] - 49 y.o. female MRN [id number]  Date of birth: 10-30-1970  HPI  Stephanie [name] is here for follow up of chronic medical conditions.  Chronic HTN Disease Monitoring:  Home BP Monitoring - Usually monitors at home. But stopped because she is stressed out about losing her job and problems with her significant other.  Chest pain- no  Dyspnea- no Headache - no  Medications: Norvasc 10 mg, Losartan 50 mg, Coreg 6.25 mg  Compliance- no, hasn't taken any for a couple weeks  Lightheadedness- no  Edema- no    Diabetes mellitus, Type 2 Disease Monitoring             Blood Sugar Ranges: Fasting -130s              Polyuria: No              Visual problems: no   Urine Microalbumin 789 (June 2020) Patient on Losartan 50 mg started after that result for renal protection.   Last A1C: 8.4 (Sept 2020)   Medications: Metformin 1000 mg BID, Glipizide 10 mg in AM and 5 mg PM  Medication Compliance: no, hasn't taken for a few weeks   Medication Side Effects             Hypoglycemia: no   Preventitive Health Care             Eye Exam: Ordered              Foot Exam: UTD      Health Maintenance:  Health Maintenance Due  Topic Date Due  . OPHTHALMOLOGY EXAM  04/02/1980  . HIV Screening  04/02/1985  . PAP SMEAR-Modifier  04/02/2000    -  reports that she has been smoking. She has been smoking about 0.50 packs per day. She has never used smokeless tobacco. - Review of Systems: Per HPI. - Past Medical History: Patient Active Problem List   Diagnosis Date Noted  . Abnormal EKG 04/13/2018  . Type 2 diabetes mellitus without complication, without long-term current use of insulin (HCC) 12/31/2017  . Elevated blood sugar 12/31/2017  . Essential hypertension 12/31/2017   - Medications: reviewed and updated   Objective:   Physical Exam BP (!) 207/133   Pulse 88   Temp (!) 97.2 F (36.2 C) (Temporal)   Resp 17   Wt 143 lb 3.2 oz  (65 kg)   SpO2 95%   BMI 25.37 kg/m  Physical Exam  Constitutional: She is oriented to person, place, and time and well-developed, well-nourished, and in no distress. No distress.  HENT:  Head: Normocephalic and atraumatic.  Eyes: Conjunctivae and EOM are normal.  Cardiovascular: Normal rate, regular rhythm and normal heart sounds.  No murmur heard. Pulmonary/Chest: Effort normal and breath sounds normal. No respiratory distress.  Musculoskeletal:        General: Normal range of motion.  Neurological: She is alert and oriented to person, place, and time.  Skin: Skin is warm and dry. She is not diaphoretic.  Psychiatric: Affect and judgment normal.           Assessment & Plan:   1. Essential hypertension Extremely elevated today, no red flag symptoms. Likely related to noncompliance with medication. Clonidine 0.3 mg given, patient did not want to stay for repeat BP reading. Discussed importance of resuming her medications immediately and reasons to seek emergency medical care. Return in one month for close f/u.  Counseled on  blood pressure goal of less than 130/80, low-sodium, DASH diet, medication compliance, 150 minutes of moderate intensity exercise per week. Discussed medication compliance, adverse effects. - cloNIDine (CATAPRES) tablet 0.3 mg - amLODipine (NORVASC) 10 MG tablet; Take 1 tablet (10 mg total) by mouth daily.  Dispense: 90 tablet; Refill: 1 - carvedilol (COREG) 6.25 MG tablet; Take 1 tablet (6.25 mg total) by mouth 2 (two) times daily with a meal.  Dispense: 180 tablet; Refill: 1 - losartan (COZAAR) 50 MG tablet; Take 1 tablet (50 mg total) by mouth daily.  Dispense: 90 tablet; Refill: 3 - metFORMIN (GLUCOPHAGE) 500 MG tablet; Take 2 tablets (1,000 mg total) by mouth 2 (two) times daily with a meal.  Dispense: 360 tablet; Refill: 1  2. Mixed hyperlipidemia - atorvastatin (LIPITOR) 10 MG tablet; Take 1 tablet (10 mg total) by mouth daily.  Dispense: 90 tablet;  Refill: 3  3. Type 2 diabetes mellitus with other circulatory complication, without long-term current use of insulin (HCC) A1c worsened to 9.1% likely to related to long term non-compliance. CBG, non-fasting, was 402. Patient denies symptoms that would be concerning for DKA or HHS. Patient to resume monitoring CBGs at home and her DM medications. Return in one month for f/u.  Counseled on Diabetic diet, my plate method, 557 minutes of moderate intensity exercise/week Blood sugar logs with fasting goals of 80-120 mg/dl, random of less than 180 and in the event of sugars less than 60 mg/dl or greater than 400 mg/dl encouraged to notify the clinic. Advised on the need for annual eye exams, annual foot exams, Pneumonia vaccine. - HgB A1c - Ambulatory referral to Ophthalmology - Glucose (CBG) - gabapentin (NEURONTIN) 300 MG capsule; Take 1 capsule (300 mg total) by mouth at bedtime.  Dispense: 90 capsule; Refill: 3 - glipiZIDE (GLUCOTROL) 5 MG tablet; Take 10 mg (2 tablets) with breakfast and 5 mg with dinner.  Dispense: 270 tablet; Refill: 2 - losartan (COZAAR) 50 MG tablet; Take 1 tablet (50 mg total) by mouth daily.  Dispense: 90 tablet; Refill: 3 - metFORMIN (GLUCOPHAGE) 500 MG tablet; Take 2 tablets (1,000 mg total) by mouth 2 (two) times daily with a meal.  Dispense: 360 tablet; Refill: [place], D.O. 04/07/2019, 3:05 PM Primary Care at [facility]

## 2019-05-07 ENCOUNTER — Telehealth: Payer: Self-pay

## 2019-05-07 NOTE — Telephone Encounter (Signed)
Called patient to do their pre-visit COVID screening.  Call went to voicemail. Unable to do prescreening.  

## 2019-05-11 ENCOUNTER — Ambulatory Visit: Payer: Self-pay | Admitting: Internal Medicine

## 2019-05-18 MED FILL — GABAPENTIN 300 MG CAPSULE: 300 | 30 days supply | Qty: 30 | Fill #0

## 2019-05-18 MED FILL — ?METFORMIN HCL 500MG TABLET: 500 | 30 days supply | Qty: 120 | Fill #0

## 2019-05-18 MED FILL — ?GLIPIZIDE 5MG TABLET: 5 | 30 days supply | Qty: 90 | Fill #0

## 2019-05-18 MED FILL — LOSARTAN POTASSIUM 50 MG TA: 50 | 30 days supply | Qty: 30 | Fill #0

## 2019-05-18 MED FILL — ?ATORVASTATIN CALCIUM 10MG: 10 | 30 days supply | Qty: 30 | Fill #0

## 2019-05-18 MED FILL — AMLODIPINE BESYLATE 10 MG T: 10 | 30 days supply | Qty: 30 | Fill #0

## 2019-05-18 MED FILL — ?CARVEDILOL 6.25 MG TABLET: 6.25 | 30 days supply | Qty: 60 | Fill #0

## 2019-06-23 ENCOUNTER — Encounter (HOSPITAL_COMMUNITY): Payer: Self-pay | Admitting: Emergency Medicine

## 2019-06-23 ENCOUNTER — Emergency Department (HOSPITAL_COMMUNITY)
Admission: EM | Admit: 2019-06-23 | Discharge: 2019-06-23 | Disposition: A | Discharge status: Home / Self Care | Payer: No Typology Code available for payment source | Attending: Emergency Medicine | Admitting: Emergency Medicine

## 2019-06-23 ENCOUNTER — Other Ambulatory Visit: Payer: Self-pay

## 2019-06-23 DIAGNOSIS — Z7984 Long term (current) use of oral hypoglycemic drugs: Secondary | ICD-10-CM | POA: Diagnosis not present

## 2019-06-23 DIAGNOSIS — M542 Cervicalgia: Secondary | ICD-10-CM | POA: Diagnosis not present

## 2019-06-23 DIAGNOSIS — F17210 Nicotine dependence, cigarettes, uncomplicated: Secondary | ICD-10-CM | POA: Diagnosis not present

## 2019-06-23 DIAGNOSIS — M545 Low back pain, unspecified: Secondary | ICD-10-CM

## 2019-06-23 DIAGNOSIS — E119 Type 2 diabetes mellitus without complications: Secondary | ICD-10-CM | POA: Insufficient documentation

## 2019-06-23 DIAGNOSIS — Y92410 Unspecified street and highway as the place of occurrence of the external cause: Secondary | ICD-10-CM | POA: Insufficient documentation

## 2019-06-23 DIAGNOSIS — Y939 Activity, unspecified: Secondary | ICD-10-CM | POA: Diagnosis not present

## 2019-06-23 DIAGNOSIS — Z79899 Other long term (current) drug therapy: Secondary | ICD-10-CM | POA: Insufficient documentation

## 2019-06-23 DIAGNOSIS — Y999 Unspecified external cause status: Secondary | ICD-10-CM | POA: Insufficient documentation

## 2019-06-23 DIAGNOSIS — I10 Essential (primary) hypertension: Secondary | ICD-10-CM | POA: Diagnosis not present

## 2019-06-23 MED ORDER — NAPROXEN 250 MG PO TABS
500.0000 mg | ORAL_TABLET | Freq: Once | ORAL | Status: AC
  Administered 2019-06-23: 500 mg via ORAL
  Filled 2019-06-23: qty 2

## 2019-06-23 MED ORDER — CYCLOBENZAPRINE HCL 10 MG PO TABS: 10.0000 mg | ORAL_TABLET | Freq: Two times a day (BID) | ORAL | 0 refills | Status: DC | PRN
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
## 2019-06-23 NOTE — Discharge Instructions (Signed)
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
## 2019-06-24 MED FILL — ?ATORVASTATIN CALCIUM 10MG: 10 | 30 days supply | Qty: 30 | Fill #1

## 2019-06-24 MED FILL — LOSARTAN POTASSIUM 50 MG TA: 50 | 30 days supply | Qty: 30 | Fill #1

## 2019-06-24 MED FILL — ?AMLODIPINE BESYL 10MG TABL: 10 | 30 days supply | Qty: 30 | Fill #1

## 2019-07-24 MED FILL — LOSARTAN POTASSIUM 50 MG TA: 50 | 30 days supply | Qty: 30 | Fill #2

## 2019-07-24 MED FILL — ?AMLODIPINE BESYL 10MG TABL: 10 | 30 days supply | Qty: 30 | Fill #2

## 2019-07-24 MED FILL — ?CARVEDILOL 6.25 MG TABLET: 6.25 | 30 days supply | Qty: 60 | Fill #1

## 2019-07-24 MED FILL — ?ATORVASTATIN 10 MG TABLET: 10 | 30 days supply | Qty: 30 | Fill #2

## 2019-07-24 MED FILL — METFORMIN HCL 500 MG TABS: 500 | 30 days supply | Qty: 120 | Fill #1

## 2019-07-24 MED FILL — ?GLIPIZIDE 5MG TABLET: 5 | 30 days supply | Qty: 90 | Fill #1

## 2019-08-14 ENCOUNTER — Telehealth: Payer: Self-pay

## 2019-08-14 DIAGNOSIS — E1140 Type 2 diabetes mellitus with diabetic neuropathy, unspecified: Secondary | ICD-10-CM | POA: Insufficient documentation

## 2019-08-14 NOTE — Telephone Encounter (Signed)
Called patient to do their pre-visit COVID screening.  Call went to voicemail. Unable to do prescreening.  

## 2019-08-17 ENCOUNTER — Encounter: Payer: Self-pay | Admitting: Internal Medicine

## 2019-08-17 ENCOUNTER — Ambulatory Visit (INDEPENDENT_AMBULATORY_CARE_PROVIDER_SITE_OTHER): Payer: Self-pay | Admitting: Internal Medicine

## 2019-08-17 ENCOUNTER — Other Ambulatory Visit: Payer: Self-pay

## 2019-08-17 VITALS — BP 157/107 | HR 95 | Temp 97.3°F | Resp 17 | Ht 61.5 in | Wt 140.0 lb

## 2019-08-17 DIAGNOSIS — I10 Essential (primary) hypertension: Secondary | ICD-10-CM

## 2019-08-17 DIAGNOSIS — Z114 Encounter for screening for human immunodeficiency virus [HIV]: Secondary | ICD-10-CM

## 2019-08-17 DIAGNOSIS — E782 Mixed hyperlipidemia: Secondary | ICD-10-CM

## 2019-08-17 DIAGNOSIS — E1140 Type 2 diabetes mellitus with diabetic neuropathy, unspecified: Secondary | ICD-10-CM

## 2019-08-17 DIAGNOSIS — Z1159 Encounter for screening for other viral diseases: Secondary | ICD-10-CM

## 2019-08-17 LAB — POCT GLYCOSYLATED HEMOGLOBIN (HGB A1C): Hemoglobin A1C: 7.6 % — AB (ref 4.0–5.6)

## 2019-08-17 LAB — GLUCOSE, POCT (MANUAL RESULT ENTRY): POC Glucose: 379 mg/dl — AB (ref 70–99)

## 2019-08-17 MED ORDER — CARVEDILOL 12.5 MG PO TABS: 12.5000 mg | ORAL_TABLET | Freq: Two times a day (BID) | ORAL | 3 refills | Status: DC

## 2019-08-17 MED ORDER — ALBUTEROL SULFATE HFA 108 (90 BASE) MCG/ACT IN AERS
2.0000 | INHALATION_SPRAY | Freq: Four times a day (QID) | RESPIRATORY_TRACT | 1 refills | Status: DC | PRN
Start: 2019-08-17 — End: 2020-03-25

## 2019-08-17 NOTE — Progress Notes (Signed)
Subjective:    Stephanie [name] - 49 y.o. female MRN [id number]  Date of birth: 06-17-1970  HPI  Stephanie [name] is here for follow up of chronic medical conditions. At her last visit, she endorsed several weeks of non compliance with her medications. Was instructed to return in one month for closer monitoring and management, but unfortunately this did not occur.   Diabetes mellitus, Type 2 Disease Monitoring             Blood Sugar Ranges: Does not check.              Polyuria: no              Visual problems: no   Urine Microalbumin 709 (June 2020) on Arb therapy   Last A1C: 9.1 (March 2021)   Medications: Glipizide 10 mg AM and 5 mg PM, Metformin 1000 mg BID  Medication Compliance: yes  Medication Side Effects             Hypoglycemia: no   Chronic HTN Disease Monitoring:  Home BP Monitoring - Does not monitor  Chest pain- no  Dyspnea- no Headache - no  Medications: Amlodipine 10 mg, Coreg 6.25 mg BID, Losartan 50 mg daily  Compliance- yes Lightheadedness- no  Edema- no   Health Maintenance:  Health Maintenance Due  Topic Date Due  . PAP SMEAR-Modifier  Never done    -  reports that she has been smoking. She has been smoking about 0.50 packs per day. She has never used smokeless tobacco. - Review of Systems: Per HPI. - Past Medical History: Patient Active Problem List   Diagnosis Date Noted  . Type 2 diabetes mellitus with diabetic neuropathy (HCC) 08/14/2019  . Abnormal EKG 04/13/2018  . Elevated blood sugar 12/31/2017  . Essential hypertension 12/31/2017   - Medications: reviewed and updated   Objective:   Physical Exam BP (!) 157/107   Pulse 95   Temp (!) 97.3 F (36.3 C) (Temporal)   Resp 17   Ht 5' 1.5" (1.562 m)   Wt 140 lb (63.5 kg)   SpO2 99%   BMI 26.02 kg/m  Physical Exam Constitutional:      General: She is not in acute distress.    Appearance: She is not diaphoretic.  HENT:     Head: Normocephalic and  atraumatic.  Eyes:     Conjunctiva/sclera: Conjunctivae normal.  Cardiovascular:     Rate and Rhythm: Normal rate and regular rhythm.     Heart sounds: Normal heart sounds. No murmur heard.   Pulmonary:     Effort: Pulmonary effort is normal. No respiratory distress.     Breath sounds: Normal breath sounds.  Musculoskeletal:        General: Normal range of motion.  Skin:    General: Skin is warm and dry.  Neurological:     Mental Status: She is alert and oriented to person, place, and time.  Psychiatric:        Mood and Affect: Affect normal.        Judgment: Judgment normal.     Assessment & Plan:   1. Type 2 diabetes mellitus with diabetic neuropathy, unspecified whether long term insulin use (HCC) A1c improved to 7.6. Discussed goal <7.0. Continue current regimen.  Counseled on Diabetic diet, my plate method, 007 minutes of moderate intensity exercise/week Blood sugar logs with fasting goals of 80-120 mg/dl, random of less than 121 and in the event of sugars less than  60 mg/dl or greater than 382 mg/dl encouraged to notify the clinic. Advised on the need for annual eye exams, annual foot exams, Pneumonia vaccine. - HgB A1c - HM Diabetes Foot Exam - Microalbumin/Creatinine Ratio, Urine - Comprehensive metabolic panel - Glucose (CBG)  2. Essential hypertension BP above goal. Patient asymptomatic. Increase Coreg from 6.25 to 12.5 mg BID as pulse appears to be able to tolerate this.  - Comprehensive metabolic panel - carvedilol (COREG) 12.5 MG tablet; Take 1 tablet (12.5 mg total) by mouth 2 (two) times daily with a meal.  Dispense: 60 tablet; Refill: 3  3. Mixed hyperlipidemia Last LDL at goal with result of 55 in Sept 2020. Continue Lipitor 10 mg.   4. Need for hepatitis C screening test - Hepatitis C Antibody  5. Encounter for screening for HIV - HIV antibody (with reflex)     Marcy Siren, D.O. 08/19/2019, 3:47 PM Primary Care at [facility]

## 2019-08-17 NOTE — Patient Instructions (Signed)
We are increasing your Coreg to 12.5 mg twice per day. You can take two of the tablets you have now twice per day but I am sending in an Rx for a higher dose.

## 2019-08-18 LAB — COMPREHENSIVE METABOLIC PANEL
ALT: 35 IU/L — ABNORMAL HIGH (ref 0–32)
AST: 25 IU/L (ref 0–40)
Albumin/Globulin Ratio: 1.8 (ref 1.2–2.2)
Albumin: 4.4 g/dL (ref 3.8–4.8)
Alkaline Phosphatase: 132 IU/L — ABNORMAL HIGH (ref 48–121)
BUN/Creatinine Ratio: 9 (ref 9–23)
BUN: 8 mg/dL (ref 6–24)
Bilirubin Total: 0.5 mg/dL (ref 0.0–1.2)
CO2: 18 mmol/L — ABNORMAL LOW (ref 20–29)
Calcium: 9.3 mg/dL (ref 8.7–10.2)
Chloride: 101 mmol/L (ref 96–106)
Creatinine, Ser: 0.94 mg/dL (ref 0.57–1.00)
GFR calc Af Amer: 82 mL/min/{1.73_m2} (ref >59)
GFR calc non Af Amer: 71 mL/min/{1.73_m2} (ref >59)
Globulin, Total: 2.4 g/dL (ref 1.5–4.5)
Glucose: 425 mg/dL — ABNORMAL HIGH (ref 65–99)
Potassium: 4.6 mmol/L (ref 3.5–5.2)
Sodium: 134 mmol/L (ref 134–144)
Total Protein: 6.8 g/dL (ref 6.0–8.5)

## 2019-08-18 LAB — MICROALBUMIN / CREATININE URINE RATIO
Creatinine, Urine: 44.6 mg/dL
Microalb/Creat Ratio: 504 mg/g creat — ABNORMAL HIGH (ref 0–29)
Microalbumin, Urine: 224.8 ug/mL

## 2019-08-18 LAB — HIV ANTIBODY (ROUTINE TESTING W REFLEX): HIV Screen 4th Generation wRfx: NONREACTIVE

## 2019-08-18 LAB — HEPATITIS C ANTIBODY: Hep C Virus Ab: <0.1 s/co ratio (ref 0.0–0.9)

## 2019-08-18 MED FILL — ALBUTEROL SULFATE HFA 108 (: 108 (90 BAS | 25 days supply | Qty: 18 | Fill #0

## 2019-08-18 MED FILL — CARVEDILOL 12.5 MG TABLET: 12.5 | 30 days supply | Qty: 60 | Fill #0

## 2019-08-26 MED FILL — ALBUTEROL SULFATE HFA 108 (: 108 (90 BAS | 25 days supply | Qty: 18 | Fill #0

## 2019-08-26 MED FILL — CARVEDILOL 12.5 MG TABLET: 12.5 | 30 days supply | Qty: 60 | Fill #0

## 2019-11-18 ENCOUNTER — Ambulatory Visit: Payer: Self-pay | Admitting: Internal Medicine

## 2019-12-02 ENCOUNTER — Ambulatory Visit: Payer: Self-pay

## 2019-12-11 ENCOUNTER — Ambulatory Visit: Payer: Self-pay | Admitting: Internal Medicine

## 2019-12-11 DIAGNOSIS — I10 Essential (primary) hypertension: Secondary | ICD-10-CM

## 2019-12-11 DIAGNOSIS — E1140 Type 2 diabetes mellitus with diabetic neuropathy, unspecified: Secondary | ICD-10-CM

## 2019-12-11 DIAGNOSIS — E782 Mixed hyperlipidemia: Secondary | ICD-10-CM

## 2020-01-05 ENCOUNTER — Ambulatory Visit (INDEPENDENT_AMBULATORY_CARE_PROVIDER_SITE_OTHER): Payer: Self-pay | Admitting: Physician Assistant

## 2020-01-05 ENCOUNTER — Other Ambulatory Visit: Payer: Self-pay

## 2020-01-05 ENCOUNTER — Encounter: Payer: Self-pay | Admitting: Physician Assistant

## 2020-01-05 ENCOUNTER — Other Ambulatory Visit: Payer: Self-pay | Admitting: Physician Assistant

## 2020-01-05 VITALS — BP 241/141 | HR 100 | Temp 98.7°F | Resp 18 | Ht 59.0 in | Wt 140.0 lb

## 2020-01-05 DIAGNOSIS — E119 Type 2 diabetes mellitus without complications: Secondary | ICD-10-CM | POA: Insufficient documentation

## 2020-01-05 DIAGNOSIS — E1159 Type 2 diabetes mellitus with other circulatory complications: Secondary | ICD-10-CM

## 2020-01-05 DIAGNOSIS — R748 Abnormal levels of other serum enzymes: Secondary | ICD-10-CM | POA: Insufficient documentation

## 2020-01-05 DIAGNOSIS — I160 Hypertensive urgency: Secondary | ICD-10-CM | POA: Insufficient documentation

## 2020-01-05 DIAGNOSIS — I10 Essential (primary) hypertension: Secondary | ICD-10-CM

## 2020-01-05 DIAGNOSIS — E782 Mixed hyperlipidemia: Secondary | ICD-10-CM | POA: Insufficient documentation

## 2020-01-05 DIAGNOSIS — E1140 Type 2 diabetes mellitus with diabetic neuropathy, unspecified: Secondary | ICD-10-CM

## 2020-01-05 MED ORDER — CARVEDILOL 12.5 MG PO TABS: 12.5000 mg | ORAL_TABLET | Freq: Two times a day (BID) | ORAL | 3 refills | Status: DC

## 2020-01-05 MED ORDER — GLIPIZIDE 5 MG PO TABS: ORAL_TABLET | ORAL | 2 refills | Status: DC

## 2020-01-05 MED ORDER — GABAPENTIN 300 MG PO CAPS: 300.0000 mg | ORAL_CAPSULE | Freq: Two times a day (BID) | ORAL | 1 refills | Status: DC

## 2020-01-05 MED ORDER — LOSARTAN POTASSIUM 50 MG PO TABS: 50.0000 mg | ORAL_TABLET | Freq: Every day | ORAL | 1 refills | Status: DC

## 2020-01-05 MED ORDER — AMLODIPINE BESYLATE 10 MG PO TABS: 10.0000 mg | ORAL_TABLET | Freq: Every day | ORAL | 1 refills | Status: DC

## 2020-01-05 MED ORDER — METFORMIN HCL 1000 MG PO TABS: 1000.0000 mg | ORAL_TABLET | Freq: Two times a day (BID) | ORAL | 2 refills | Status: DC

## 2020-01-05 MED ORDER — ATORVASTATIN CALCIUM 10 MG PO TABS: 10.0000 mg | ORAL_TABLET | Freq: Every day | ORAL | 3 refills | Status: DC

## 2020-01-05 MED FILL — GABAPENTIN 300 MG CAPSULE: 300 | 30 days supply | Qty: 60 | Fill #0

## 2020-01-05 MED FILL — ATORVASTATIN 10 MG TABLET: 10 | 30 days supply | Qty: 30 | Fill #0

## 2020-01-05 MED FILL — CARVEDILOL 12.5 MG TABLET: 12.5 | 30 days supply | Qty: 60 | Fill #0

## 2020-01-05 MED FILL — glipiZIDE 5 MG TABS: 5 | 30 days supply | Qty: 90 | Fill #0

## 2020-01-05 MED FILL — LOSARTAN POTASSIUM 50 MG TA: 50 | 30 days supply | Qty: 30 | Fill #0

## 2020-01-05 MED FILL — METFORMIN HCL 1000 MG TABS: 1000 | 30 days supply | Qty: 60 | Fill #0

## 2020-01-05 MED FILL — AMLODIPINE BESYLATE 10 MG T: 10 | 30 days supply | Qty: 30 | Fill #0

## 2020-01-05 NOTE — Progress Notes (Signed)
Established Patient Office Visit  Subjective:  Patient ID: Stephanie [name], female    DOB: 1970-12-06  Age: 49 y.o. MRN: [id number]  CC:  Chief Complaint  Patient presents with  . Hypertension  . Peripheral Neuropathy    HPI Stephanie [name]   Reports that she has been having increased bilateral foot pain, states that the gabapentin did offer relief when she first started taking it, but her foot pain has worsened, is present constantly.  Reports that she has been checking her glucose levels at home, states that they run approximately 130.     Reports that she has been out of all of her blood pressure medications for the last 4 days, states that she just smoked a cigarette and is feeling anxious and attributes this to her elevated blood pressure.  Denies any hypertensive symptoms.  Does not check blood pressure at home    Past Medical History:  Diagnosis Date  . Diabetes mellitus without complication (HCC)   . Hypertension     History reviewed. No pertinent surgical history.  History reviewed. No pertinent family history.  Social History   Socioeconomic History  . Marital status: Single    Spouse name: Not on file  . Number of children: Not on file  . Years of education: Not on file  . Highest education level: Not on file  Occupational History  . Not on file  Tobacco Use  . Smoking status: Current Every Day Smoker    Packs/day: 0.50  . Smokeless tobacco: Never Used  Vaping Use  . Vaping Use: Never used  Substance and Sexual Activity  . Alcohol use: Yes    Alcohol/week: 6.0 standard drinks    Types: 6 Cans of beer per week    Comment: daily  . Drug use: No  . Sexual activity: Not Currently  Other Topics Concern  . Not on file  Social History Narrative  . Not on file   Social Determinants of Health   Financial Resource Strain:   . Difficulty of Paying Living Expenses: Not on file  Food Insecurity:   . Worried About Programme researcher, broadcasting/film/videounning Out of Food in the Last  Year: Not on file  . Ran Out of Food in the Last Year: Not on file  Transportation Needs:   . Lack of Transportation (Medical): Not on file  . Lack of Transportation (Non-Medical): Not on file  Physical Activity:   . Days of Exercise per Week: Not on file  . Minutes of Exercise per Session: Not on file  Stress:   . Feeling of Stress : Not on file  Social Connections:   . Frequency of Communication with Friends and Family: Not on file  . Frequency of Social Gatherings with Friends and Family: Not on file  . Attends Religious Services: Not on file  . Active Member of Clubs or Organizations: Not on file  . Attends BankerClub or Organization Meetings: Not on file  . Marital Status: Not on file  Intimate Partner Violence:   . Fear of Current or Ex-Partner: Not on file  . Emotionally Abused: Not on file  . Physically Abused: Not on file  . Sexually Abused: Not on file    Outpatient Medications Prior to Visit  Medication Sig Dispense Refill  . albuterol (VENTOLIN HFA) 108 (90 Base) MCG/ACT inhaler Inhale 2 puffs into the lungs every 6 (six) hours as needed for wheezing or shortness of breath. 18 g 1  . TRUE METRIX BLOOD GLUCOSE TEST  test strip Use as instructed. Check blood glucose level by fingerstick twice per day. 100 each 1  . TRUEplus Lancets 28G MISC Use as instructed. Check blood glucose level by fingerstick twice per day. 100 each 2  . amLODipine (NORVASC) 10 MG tablet Take 1 tablet (10 mg total) by mouth daily. 90 tablet 1  . atorvastatin (LIPITOR) 10 MG tablet Take 1 tablet (10 mg total) by mouth daily. 90 tablet 3  . carvedilol (COREG) 12.5 MG tablet Take 1 tablet (12.5 mg total) by mouth 2 (two) times daily with a meal. 60 tablet 3  . gabapentin (NEURONTIN) 300 MG capsule Take 1 capsule (300 mg total) by mouth at bedtime. 90 capsule 3  . glipiZIDE (GLUCOTROL) 5 MG tablet Take 10 mg (2 tablets) with breakfast and 5 mg with dinner. 270 tablet 2  . losartan (COZAAR) 50 MG tablet Take 1  tablet (50 mg total) by mouth daily. 90 tablet 3  . metFORMIN (GLUCOPHAGE) 500 MG tablet Take 2 tablets (1,000 mg total) by mouth 2 (two) times daily with a meal. 360 tablet 1   No facility-administered medications prior to visit.    Allergies  Allergen Reactions  . Lisinopril Cough    ROS Review of Systems  Constitutional: Negative.   HENT: Negative.   Eyes: Negative.   Respiratory: Negative for cough, chest tightness and wheezing.   Cardiovascular: Negative for chest pain and palpitations.  Gastrointestinal: Negative.   Endocrine: Negative.   Genitourinary: Negative.   Musculoskeletal: Positive for arthralgias.  Skin: Negative.   Allergic/Immunologic: Negative.   Neurological: Negative for dizziness, light-headedness and headaches.  Hematological: Negative.   Psychiatric/Behavioral: Negative for self-injury and suicidal ideas. The patient is nervous/anxious.       Objective:    Physical Exam Vitals and nursing note reviewed.  Constitutional:      General: She is not in acute distress.    Appearance: Normal appearance. She is not ill-appearing.  HENT:     Right Ear: External ear normal.     Left Ear: External ear normal.     Nose: Nose normal.     Mouth/Throat:     Mouth: Mucous membranes are moist.     Pharynx: Oropharynx is clear.  Eyes:     Extraocular Movements: Extraocular movements intact.     Conjunctiva/sclera: Conjunctivae normal.     Pupils: Pupils are equal, round, and reactive to light.  Cardiovascular:     Rate and Rhythm: Normal rate and regular rhythm.     Pulses: Normal pulses.     Heart sounds: Normal heart sounds.  Pulmonary:     Effort: Pulmonary effort is normal.     Breath sounds: Normal breath sounds.  Abdominal:     General: Abdomen is flat.     Palpations: Abdomen is soft.  Musculoskeletal:        General: Normal range of motion.     Cervical back: Normal range of motion and neck supple.  Skin:    General: Skin is warm and dry.   Neurological:     General: No focal deficit present.     Mental Status: She is alert and oriented to person, place, and time.  Psychiatric:        Mood and Affect: Mood normal.        Behavior: Behavior normal.        Thought Content: Thought content normal.        Judgment: Judgment normal.     BP Marland Kitchen)  241/141 (BP Location: Left Arm, Patient Position: Sitting, Cuff Size: Normal)   Pulse 100   Temp 98.7 F (37.1 C) (Oral)   Resp 18   Ht 4\' 11"  (1.499 m)   Wt 140 lb (63.5 kg)   SpO2 99%   BMI 28.28 kg/m  Wt Readings from Last 3 Encounters:  01/05/20 140 lb (63.5 kg)  08/17/19 140 lb (63.5 kg)  06/23/19 145 lb (65.8 kg)     Health Maintenance Due  Topic Date Due  . COVID-19 Vaccine (1) Never done  . PAP SMEAR-Modifier  Never done    There are no preventive care reminders to display for this patient.  Lab Results  Component Value Date   TSH 1.299 11/18/2017   Lab Results  Component Value Date   WBC 6.2 10/08/2018   HGB 14.2 10/08/2018   HCT 41.4 10/08/2018   MCV 87 10/08/2018   PLT 257 10/08/2018   Lab Results  Component Value Date   NA 134 08/17/2019   K 4.6 08/17/2019   CO2 18 (L) 08/17/2019   GLUCOSE 425 (H) 08/17/2019   BUN 8 08/17/2019   CREATININE 0.94 08/17/2019   BILITOT 0.5 08/17/2019   ALKPHOS 132 (H) 08/17/2019   AST 25 08/17/2019   ALT 35 (H) 08/17/2019   PROT 6.8 08/17/2019   ALBUMIN 4.4 08/17/2019   CALCIUM 9.3 08/17/2019   ANIONGAP 10 12/14/2017   Lab Results  Component Value Date   CHOL 145 10/08/2018   Lab Results  Component Value Date   HDL 33 (L) 10/08/2018   Lab Results  Component Value Date   LDLCALC 55 10/08/2018   Lab Results  Component Value Date   TRIG 377 (H) 10/08/2018   Lab Results  Component Value Date   CHOLHDL 4.4 10/08/2018   Lab Results  Component Value Date   HGBA1C 7.6 (A) 08/17/2019      Assessment & Plan:   Problem List Items Addressed This Visit      Cardiovascular and Mediastinum    Essential hypertension   Relevant Medications   amLODipine (NORVASC) 10 MG tablet   atorvastatin (LIPITOR) 10 MG tablet   carvedilol (COREG) 12.5 MG tablet   losartan (COZAAR) 50 MG tablet   metFORMIN (GLUCOPHAGE) 1000 MG tablet   Asymptomatic hypertensive urgency   Relevant Medications   amLODipine (NORVASC) 10 MG tablet   atorvastatin (LIPITOR) 10 MG tablet   carvedilol (COREG) 12.5 MG tablet   losartan (COZAAR) 50 MG tablet     Endocrine   Type 2 diabetes mellitus with diabetic neuropathy (HCC) - Primary   Relevant Medications   atorvastatin (LIPITOR) 10 MG tablet   glipiZIDE (GLUCOTROL) 5 MG tablet   losartan (COZAAR) 50 MG tablet   metFORMIN (GLUCOPHAGE) 1000 MG tablet   Other Relevant Orders   CBC with Differential/Platelet   Vitamin D, 25-hydroxy   Diabetes mellitus (HCC)   Relevant Medications   atorvastatin (LIPITOR) 10 MG tablet   gabapentin (NEURONTIN) 300 MG capsule   glipiZIDE (GLUCOTROL) 5 MG tablet   losartan (COZAAR) 50 MG tablet   metFORMIN (GLUCOPHAGE) 1000 MG tablet   Other Relevant Orders   CBC with Differential/Platelet   Vitamin D, 25-hydroxy     Other   Mixed hyperlipidemia   Relevant Medications   amLODipine (NORVASC) 10 MG tablet   atorvastatin (LIPITOR) 10 MG tablet   carvedilol (COREG) 12.5 MG tablet   losartan (COZAAR) 50 MG tablet   Other Relevant Orders   Lipid panel  Elevated liver enzymes   Relevant Orders   Hepatic Function Panel     1. Type 2 diabetes mellitus with diabetic neuropathy, unspecified whether long term insulin use (HCC)  - CBC with Differential/Platelet; Future - Vitamin D, 25-hydroxy; Future  2. Essential hypertension Resume medications, patient refused clonidine 0.2 mg, patient education given on urgency of her elevated blood pressure.  Red flag warnings given for prompt evaluation emergency department. - amLODipine (NORVASC) 10 MG tablet; Take 1 tablet (10 mg total) by mouth daily.  Dispense: 90 tablet; Refill:  1 - carvedilol (COREG) 12.5 MG tablet; Take 1 tablet (12.5 mg total) by mouth 2 (two) times daily with a meal.  Dispense: 60 tablet; Refill: 3 - losartan (COZAAR) 50 MG tablet; Take 1 tablet (50 mg total) by mouth daily.  Dispense: 90 tablet; Refill: 1 - metFORMIN (GLUCOPHAGE) 1000 MG tablet; Take 1 tablet (1,000 mg total) by mouth in the morning and at bedtime.  Dispense: 60 tablet; Refill: 2  3. Mixed hyperlipidemia  - Lipid panel; Future - atorvastatin (LIPITOR) 10 MG tablet; Take 1 tablet (10 mg total) by mouth daily.  Dispense: 90 tablet; Refill: 3  4. Elevated liver enzymes  - Hepatic Function Panel; Future  5. Type 2 diabetes mellitus with other circulatory complication, without long-term current use of insulin (HCC) Increase gabapentin twice daily.  A1c improved 7.0.  Continue daily monitoring, continue current regimen - gabapentin (NEURONTIN) 300 MG capsule; Take 1 capsule (300 mg total) by mouth 2 (two) times daily.  Dispense: 180 capsule; Refill: 1 - glipiZIDE (GLUCOTROL) 5 MG tablet; Take 10 mg (2 tablets) with breakfast and 5 mg with dinner.  Dispense: 270 tablet; Refill: 2 - losartan (COZAAR) 50 MG tablet; Take 1 tablet (50 mg total) by mouth daily.  Dispense: 90 tablet; Refill: 1 - metFORMIN (GLUCOPHAGE) 1000 MG tablet; Take 1 tablet (1,000 mg total) by mouth in the morning and at bedtime.  Dispense: 60 tablet; Refill: 2  6. Asymptomatic hypertensive urgency  Patient to return for fasting labs and blood pressure check.  Patient strongly encouraged to stay at office for dose of clonidine, patient refused.  [facility] even though she is unable to pay for her medications today to get help with her prescription filled  Meds ordered this encounter  Medications  . amLODipine (NORVASC) 10 MG tablet    Sig: Take 1 tablet (10 mg total) by mouth daily.    Dispense:  90 tablet    Refill:  1    Order Specific Question:    Supervising Provider    Answer:   Delford Field, PATRICK E [[id number]]  . atorvastatin (LIPITOR) 10 MG tablet    Sig: Take 1 tablet (10 mg total) by mouth daily.    Dispense:  90 tablet    Refill:  3    Order Specific Question:   Supervising Provider    Answer:   Delford Field, PATRICK E [[id number]]  . carvedilol (COREG) 12.5 MG tablet    Sig: Take 1 tablet (12.5 mg total) by mouth 2 (two) times daily with a meal.    Dispense:  60 tablet    Refill:  3    Order Specific Question:   Supervising Provider    Answer:   Shan Levans E [[id number]]  . gabapentin (NEURONTIN) 300 MG capsule    Sig: Take 1 capsule (300 mg total) by mouth 2 (two) times daily.    Dispense:  180 capsule  Refill:  1    Dose change    Order Specific Question:   Supervising Provider    Answer:   Storm Frisk [[id number]]  . glipiZIDE (GLUCOTROL) 5 MG tablet    Sig: Take 10 mg (2 tablets) with breakfast and 5 mg with dinner.    Dispense:  270 tablet    Refill:  2    Order Specific Question:   Supervising Provider    Answer:   Delford Field, PATRICK E [[id number]]  . losartan (COZAAR) 50 MG tablet    Sig: Take 1 tablet (50 mg total) by mouth daily.    Dispense:  90 tablet    Refill:  1    Order Specific Question:   Supervising Provider    Answer:   Delford Field, PATRICK E [[id number]]  . metFORMIN (GLUCOPHAGE) 1000 MG tablet    Sig: Take 1 tablet (1,000 mg total) by mouth in the morning and at bedtime.    Dispense:  60 tablet    Refill:  2    Order Specific Question:   Supervising Provider    Answer:   Shan Levans E [[id number]]    I have reviewed the patient's medical history (PMH, PSH, Social History, Family History, Medications, and allergies) , and have been updated if relevant. I spent 30 minutes reviewing chart and  face to face time with patient.    Follow-up: Return in about 4 weeks (around 02/02/2020) for with Dr. Earlene Plater at [facility].    Kasandra Knudsen Mayers, PA-C

## 2020-01-05 NOTE — Progress Notes (Signed)
Patient has not eaten today. Patient has taken medication. Patient complains of bilateral neuropathy in her feet which increases at night and at the end of her 8 hr shift. Patient complains of albuterol inhaler not aiding in her breathing concerns. CBG 311 A1C 7.0

## 2020-01-05 NOTE — Patient Instructions (Addendum)
Resume your BP meds, please check your blood pressure on a daily basis, keep a written log and bring to next office visit.  Please return to the office for fasting labs and blood pressure check within the next few days.  I increased her gabapentin to 300 mg twice a day.  I will work on the paperwork to get you a mammogram scholarship.  We will call you with your test results.  Roney Jaffe, PA-C Physician Assistant [facility] [email or web address]    Hypertension, Adult High blood pressure (hypertension) is when the force of blood pumping through the arteries is too strong. The arteries are the blood vessels that carry blood from the heart throughout the body. Hypertension forces the heart to work harder to pump blood and may cause arteries to become narrow or stiff. Untreated or uncontrolled hypertension can cause a heart attack, heart failure, a stroke, kidney disease, and other problems. A blood pressure reading consists of a higher number over a lower number. Ideally, your blood pressure should be below 120/80. The first ("top") number is called the systolic pressure. It is a measure of the pressure in your arteries as your heart beats. The second ("bottom") number is called the diastolic pressure. It is a measure of the pressure in your arteries as the heart relaxes. What are the causes? The exact cause of this condition is not known. There are some conditions that result in or are related to high blood pressure. What increases the risk? Some risk factors for high blood pressure are under your control. The following factors may make you more likely to develop this condition:  Smoking.  Having type 2 diabetes mellitus, high cholesterol, or both.  Not getting enough exercise or physical activity.  Being overweight.  Having too much fat, sugar, calories, or salt (sodium) in your diet.  Drinking too much alcohol. Some risk  factors for high blood pressure may be difficult or impossible to change. Some of these factors include:  Having chronic kidney disease.  Having a family history of high blood pressure.  Age. Risk increases with age.  Race. You may be at higher risk if you are African American.  Gender. Men are at higher risk than women before age 3. After age 17, women are at higher risk than men.  Having obstructive sleep apnea.  Stress. What are the signs or symptoms? High blood pressure may not cause symptoms. Very high blood pressure (hypertensive crisis) may cause:  Headache.  Anxiety.  Shortness of breath.  Nosebleed.  Nausea and vomiting.  Vision changes.  Severe chest pain.  Seizures. How is this diagnosed? This condition is diagnosed by measuring your blood pressure while you are seated, with your arm resting on a flat surface, your legs uncrossed, and your feet flat on the floor. The cuff of the blood pressure monitor will be placed directly against the skin of your upper arm at the level of your heart. It should be measured at least twice using the same arm. Certain conditions can cause a difference in blood pressure between your right and left arms. Certain factors can cause blood pressure readings to be lower or higher than normal for a short period of time:  When your blood pressure is higher when you are in a health care provider's office than when you are at home, this is called white coat hypertension. Most people with this condition do not need medicines.  When your blood pressure is higher at home than  when you are in a health care provider's office, this is called masked hypertension. Most people with this condition may need medicines to control blood pressure. If you have a high blood pressure reading during one visit or you have normal blood pressure with other risk factors, you may be asked to:  Return on a different day to have your blood pressure checked  again.  Monitor your blood pressure at home for 1 week or longer. If you are diagnosed with hypertension, you may have other blood or imaging tests to help your health care provider understand your overall risk for other conditions. How is this treated? This condition is treated by making healthy lifestyle changes, such as eating healthy foods, exercising more, and reducing your alcohol intake. Your health care provider may prescribe medicine if lifestyle changes are not enough to get your blood pressure under control, and if:  Your systolic blood pressure is above 130.  Your diastolic blood pressure is above 80. Your personal target blood pressure may vary depending on your medical conditions, your age, and other factors. Follow these instructions at home: Eating and drinking   Eat a diet that is high in fiber and potassium, and low in sodium, added sugar, and fat. An example eating plan is called the DASH (Dietary Approaches to Stop Hypertension) diet. To eat this way: ? Eat plenty of fresh fruits and vegetables. Try to fill one half of your plate at each meal with fruits and vegetables. ? Eat whole grains, such as whole-wheat pasta, brown rice, or whole-grain bread. Fill about one fourth of your plate with whole grains. ? Eat or drink low-fat dairy products, such as skim milk or low-fat yogurt. ? Avoid fatty cuts of meat, processed or cured meats, and poultry with skin. Fill about one fourth of your plate with lean proteins, such as fish, chicken without skin, beans, eggs, or tofu. ? Avoid pre-made and processed foods. These tend to be higher in sodium, added sugar, and fat.  Reduce your daily sodium intake. Most people with hypertension should eat less than 1,500 mg of sodium a day.  Do not drink alcohol if: ? Your health care provider tells you not to drink. ? You are pregnant, may be pregnant, or are planning to become pregnant.  If you drink alcohol: ? Limit how much you use  to:  0-1 drink a day for women.  0-2 drinks a day for men. ? Be aware of how much alcohol is in your drink. In the [place] one drink equals one 12 oz bottle of beer (355 mL), one 5 oz glass of wine (148 mL), or one 1 oz glass of hard liquor (44 mL). Lifestyle   Work with your health care provider to maintain a healthy body weight or to lose weight. Ask what an ideal weight is for you.  Get at least 30 minutes of exercise most days of the week. Activities may include walking, swimming, or biking.  Include exercise to strengthen your muscles (resistance exercise), such as Pilates or lifting weights, as part of your weekly exercise routine. Try to do these types of exercises for 30 minutes at least 3 days a week.  Do not use any products that contain nicotine or tobacco, such as cigarettes, e-cigarettes, and chewing tobacco. If you need help quitting, ask your health care provider.  Monitor your blood pressure at home as told by your health care provider.  Keep all follow-up visits as told by your health care provider.  This is important. Medicines  Take over-the-counter and prescription medicines only as told by your health care provider. Follow directions carefully. Blood pressure medicines must be taken as prescribed.  Do not skip doses of blood pressure medicine. Doing this puts you at risk for problems and can make the medicine less effective.  Ask your health care provider about side effects or reactions to medicines that you should watch for. Contact a health care provider if you:  Think you are having a reaction to a medicine you are taking.  Have headaches that keep coming back (recurring).  Feel dizzy.  Have swelling in your ankles.  Have trouble with your vision. Get help right away if you:  Develop a severe headache or confusion.  Have unusual weakness or numbness.  Feel faint.  Have severe pain in your chest or abdomen.  Vomit repeatedly.  Have trouble  breathing. Summary  Hypertension is when the force of blood pumping through your arteries is too strong. If this condition is not controlled, it may put you at risk for serious complications.  Your personal target blood pressure may vary depending on your medical conditions, your age, and other factors. For most people, a normal blood pressure is less than 120/80.  Hypertension is treated with lifestyle changes, medicines, or a combination of both. Lifestyle changes include losing weight, eating a healthy, low-sodium diet, exercising more, and limiting alcohol. This information is not intended to replace advice given to you by your health care provider. Make sure you discuss any questions you have with your health care provider. Document Revised: 10/02/2017 Document Reviewed: 10/02/2017 Elsevier Patient Education  2020 [employer].

## 2020-01-14 MED FILL — METFORMIN HCL 1000 MG TABS: 1000 | 30 days supply | Qty: 60 | Fill #0

## 2020-01-14 MED FILL — CARVEDILOL 12.5 MG TABLET: 12.5 | 30 days supply | Qty: 60 | Fill #0

## 2020-01-14 MED FILL — AMLODIPINE BESYLATE 10 MG T: 10 | 30 days supply | Qty: 30 | Fill #0

## 2020-01-14 MED FILL — GABAPENTIN 300 MG CAPSULE: 300 | 30 days supply | Qty: 60 | Fill #0

## 2020-01-14 MED FILL — ATORVASTATIN 10 MG TABLET: 10 | 30 days supply | Qty: 30 | Fill #0

## 2020-01-14 MED FILL — LOSARTAN POTASSIUM 50 MG TA: 50 | 30 days supply | Qty: 30 | Fill #0

## 2020-01-14 MED FILL — ?GLIPIZIDE 5MG TABLET: 5 | 30 days supply | Qty: 90 | Fill #0

## 2020-02-03 ENCOUNTER — Ambulatory Visit: Payer: Self-pay | Admitting: Internal Medicine

## 2020-03-23 ENCOUNTER — Telehealth: Payer: Self-pay | Admitting: Internal Medicine

## 2020-03-23 NOTE — Telephone Encounter (Signed)
1) Medication(s) Requested (by name): Pt requesting all 8 medications to be sent to  2) Pharmacy of Choice: Pharmacy: [facility] - [place], [place] - [address] (Ph: [phone])    3) Special Requests:   Approved medications will be sent to the pharmacy, we will reach out if there is an issue.  Requests made after 3pm may not be addressed until the following business day!  If a patient is unsure of the name of the medication(s) please note and ask patient to call back when they are able to provide all info, do not send to responsible party until all information is available!

## 2020-03-24 ENCOUNTER — Telehealth: Payer: Self-pay | Admitting: Internal Medicine

## 2020-03-24 MED FILL — ?ATORVASTATIN 10 MG TABLET: 10 | 30 days supply | Qty: 30 | Fill #1

## 2020-03-24 MED FILL — CARVEDILOL 12.5 MG TABLET: 12.5 | 30 days supply | Qty: 60 | Fill #1

## 2020-03-24 MED FILL — ?GLIPIZIDE 5MG TABLET: 5 | 30 days supply | Qty: 90 | Fill #1

## 2020-03-24 MED FILL — GABAPENTIN 300 MG CAPSULE: 300 | 30 days supply | Qty: 60 | Fill #1

## 2020-03-24 MED FILL — AMLODIPINE BESYLATE 10 MG T: 10 | 30 days supply | Qty: 30 | Fill #1

## 2020-03-24 MED FILL — METFORMIN HCL 1000 MG TABS: 1000 | 30 days supply | Qty: 60 | Fill #1

## 2020-03-24 MED FILL — LOSARTAN POTASSIUM 50 MG TA: 50 | 30 days supply | Qty: 30 | Fill #1

## 2020-03-24 NOTE — Telephone Encounter (Signed)
Pt is calling again to check on the refill of the 8 medications on file. Please advise and thank you

## 2020-03-25 ENCOUNTER — Other Ambulatory Visit: Payer: Self-pay

## 2020-03-25 ENCOUNTER — Other Ambulatory Visit: Payer: Self-pay | Admitting: Internal Medicine

## 2020-03-25 DIAGNOSIS — I10 Essential (primary) hypertension: Secondary | ICD-10-CM

## 2020-03-25 DIAGNOSIS — E782 Mixed hyperlipidemia: Secondary | ICD-10-CM

## 2020-03-25 DIAGNOSIS — E1159 Type 2 diabetes mellitus with other circulatory complications: Secondary | ICD-10-CM

## 2020-03-25 MED ORDER — GLIPIZIDE 5 MG PO TABS: ORAL_TABLET | ORAL | 0 refills | Status: DC

## 2020-03-25 MED ORDER — ALBUTEROL SULFATE HFA 108 (90 BASE) MCG/ACT IN AERS: 2.0000 | INHALATION_SPRAY | Freq: Four times a day (QID) | RESPIRATORY_TRACT | 1 refills | Status: DC | PRN

## 2020-03-25 MED ORDER — ATORVASTATIN CALCIUM 10 MG PO TABS: 10.0000 mg | ORAL_TABLET | Freq: Every day | ORAL | 0 refills | Status: DC

## 2020-03-25 MED ORDER — LOSARTAN POTASSIUM 50 MG PO TABS: 50.0000 mg | ORAL_TABLET | Freq: Every day | ORAL | 0 refills | Status: DC

## 2020-03-25 MED ORDER — AMLODIPINE BESYLATE 10 MG PO TABS: 10.0000 mg | ORAL_TABLET | Freq: Every day | ORAL | 0 refills | Status: DC

## 2020-03-25 MED ORDER — CARVEDILOL 12.5 MG PO TABS: 12.5000 mg | ORAL_TABLET | Freq: Two times a day (BID) | ORAL | 0 refills | Status: DC

## 2020-03-25 MED ORDER — GABAPENTIN 300 MG PO CAPS: 300.0000 mg | ORAL_CAPSULE | Freq: Two times a day (BID) | ORAL | 0 refills | Status: DC

## 2020-03-25 MED ORDER — METFORMIN HCL 1000 MG PO TABS: 1000.0000 mg | ORAL_TABLET | Freq: Two times a day (BID) | ORAL | 0 refills | Status: DC

## 2020-03-25 NOTE — Telephone Encounter (Signed)
Duplicate encounter, will send 30 day supply of meds as pt needs f/u appt w/ PCP, not seen by PCP since 08/2019.

## 2020-03-25 NOTE — Telephone Encounter (Signed)
RF sent.

## 2020-04-04 MED FILL — LOSARTAN POTASSIUM 50 MG TA: 50 | 30 days supply | Qty: 30 | Fill #0

## 2020-04-04 MED FILL — CARVEDILOL 12.5 MG TABLET: 12.5 | 15 days supply | Qty: 30 | Fill #0

## 2020-04-04 MED FILL — GABAPENTIN 300 MG CAPSULE: 300 | 30 days supply | Qty: 60 | Fill #0

## 2020-04-04 MED FILL — AMLODIPINE BESYLATE 10 MG T: 10 | 30 days supply | Qty: 30 | Fill #0

## 2020-04-04 MED FILL — ?ATORVASTATIN 10 MG TABLET: 10 | 30 days supply | Qty: 30 | Fill #0

## 2020-04-04 MED FILL — METFORMIN HCL 1000 MG TABS: 1000 | 30 days supply | Qty: 60 | Fill #0

## 2020-04-04 MED FILL — glipiZIDE 5 MG TABS: 5 | 30 days supply | Qty: 90 | Fill #0

## 2020-06-14 ENCOUNTER — Ambulatory Visit: Payer: Self-pay | Admitting: Internal Medicine

## 2020-06-15 ENCOUNTER — Other Ambulatory Visit: Payer: Self-pay | Admitting: Nurse Practitioner

## 2020-06-15 ENCOUNTER — Encounter: Payer: Self-pay | Admitting: Nurse Practitioner

## 2020-06-15 ENCOUNTER — Ambulatory Visit (INDEPENDENT_AMBULATORY_CARE_PROVIDER_SITE_OTHER): Payer: Self-pay | Admitting: Nurse Practitioner

## 2020-06-15 ENCOUNTER — Other Ambulatory Visit: Payer: Self-pay

## 2020-06-15 VITALS — BP 204/125 | HR 87 | Temp 97.8°F | Resp 16 | Wt 137.0 lb

## 2020-06-15 DIAGNOSIS — I10 Essential (primary) hypertension: Secondary | ICD-10-CM

## 2020-06-15 DIAGNOSIS — E782 Mixed hyperlipidemia: Secondary | ICD-10-CM

## 2020-06-15 DIAGNOSIS — E1140 Type 2 diabetes mellitus with diabetic neuropathy, unspecified: Secondary | ICD-10-CM

## 2020-06-15 DIAGNOSIS — E1159 Type 2 diabetes mellitus with other circulatory complications: Secondary | ICD-10-CM

## 2020-06-15 DIAGNOSIS — R748 Abnormal levels of other serum enzymes: Secondary | ICD-10-CM

## 2020-06-15 MED ORDER — CARVEDILOL 12.5 MG PO TABS
12.5000 mg | ORAL_TABLET | Freq: Two times a day (BID) | ORAL | 2 refills | Status: DC
  Filled 2020-06-15 – 2021-02-02 (×2): qty 60, 30d supply, fill #0

## 2020-06-15 MED ORDER — LOSARTAN POTASSIUM 50 MG PO TABS
50.0000 mg | ORAL_TABLET | Freq: Every day | ORAL | 2 refills | Status: DC
  Filled 2020-06-15 – 2021-02-02 (×2): qty 30, 30d supply, fill #0

## 2020-06-15 MED ORDER — METFORMIN HCL 1000 MG PO TABS
1000.0000 mg | ORAL_TABLET | Freq: Two times a day (BID) | ORAL | 2 refills | Status: DC
  Filled 2020-06-15: qty 60, 30d supply, fill #0

## 2020-06-15 MED ORDER — AMLODIPINE BESYLATE 10 MG PO TABS
10.0000 mg | ORAL_TABLET | Freq: Every day | ORAL | 2 refills | Status: DC
Start: 2020-06-15 — End: 2021-07-13
  Filled 2020-06-15 – 2021-02-02 (×2): qty 30, 30d supply, fill #0

## 2020-06-15 MED ORDER — GLIPIZIDE 5 MG PO TABS
ORAL_TABLET | ORAL | 0 refills | Status: DC
  Filled 2020-06-15 – 2021-02-02 (×2): qty 90, 30d supply, fill #0

## 2020-06-15 NOTE — Progress Notes (Signed)
@Patient  ID: , female    DOB: [birth year], 50 y.o.   MRN: [id number]  Chief Complaint  Patient presents with  . Follow-up    Referring provider: [id number]*   HPI  Stephanie [name] is here for follow up of chronic medical conditions. At her last visit, she endorsed several weeks of non compliance with her medications. Was instructed to return in one month for closer monitoring and management, but unfortunately this did not occur.   Diabetes mellitus, Type 2 Disease Monitoring Blood Sugar Ranges: Does not check.  Polyuria: no  Visual problems: no              Urine Microalbumin 709 (June 2020) on Arb therapy              Last A1C: 7.61 (July 2021)   Medications: Glipizide 10 mg AM and 5 mg PM, Metformin 1000 mg BID  Medication Compliance: yes  Medication Side Effects Hypoglycemia: no   Chronic HTN Disease Monitoring:  Home BP Monitoring - Does not monitor  Chest pain- no  Dyspnea- no Headache - no  Medications: Amlodipine 10 mg, Coreg 12.5 mg BID, Losartan 50 mg daily  Compliance- yes Lightheadedness- no  Edema- no   Note: Blood pressure elevated in office today - patient states that she did not take medications this morning due to fasting for labs   Allergies  Allergen Reactions  . Lisinopril Cough    Immunization History  Administered Date(s) Administered  . Pneumococcal Polysaccharide-23 02/26/2018  . Tdap 02/26/2018    Past Medical History:  Diagnosis Date  . Diabetes mellitus without complication (HCC)   . Hypertension     Tobacco History: Social History   Tobacco Use  Smoking Status Current Every Day Smoker  . Packs/day: 0.50  Smokeless Tobacco Never Used   Ready to quit: Not Answered Counseling given: Not Answered   Outpatient Encounter Medications as of 06/15/2020  Medication Sig  . albuterol (VENTOLIN HFA) 108 (90 Base) MCG/ACT inhaler  Inhale 2 puffs into the lungs every 6 (six) hours as needed for wheezing or shortness of breath.  08/15/2020 atorvastatin (LIPITOR) 10 MG tablet Take 1 tablet (10 mg total) by mouth daily.  Marland Kitchen gabapentin (NEURONTIN) 300 MG capsule Take 1 capsule (300 mg total) by mouth 2 (two) times daily. *NEEDS APPOINTMENT W/ PCP PRIOR TO ANY FURTHER REFILLS*  . TRUE METRIX BLOOD GLUCOSE TEST test strip Use as instructed. Check blood glucose level by fingerstick twice per day.  . TRUEplus Lancets 28G MISC Use as instructed. Check blood glucose level by fingerstick twice per day.  . [DISCONTINUED] amLODipine (NORVASC) 10 MG tablet Take 1 tablet (10 mg total) by mouth daily.  . [DISCONTINUED] carvedilol (COREG) 12.5 MG tablet Take 1 tablet (12.5 mg total) by mouth 2 (two) times daily with a meal.  . [DISCONTINUED] glipiZIDE (GLUCOTROL) 5 MG tablet Take 10 mg (2 tablets) with breakfast and 5 mg with dinner.  . [DISCONTINUED] losartan (COZAAR) 50 MG tablet Take 1 tablet (50 mg total) by mouth daily.  . [DISCONTINUED] metFORMIN (GLUCOPHAGE) 1000 MG tablet Take 1 tablet (1,000 mg total) by mouth in the morning and at bedtime.  Marland Kitchen amLODipine (NORVASC) 10 MG tablet Take 1 tablet (10 mg total) by mouth daily.  . carvedilol (COREG) 12.5 MG tablet Take 1 tablet (12.5 mg total) by mouth 2 (two) times daily with a meal.  . glipiZIDE (GLUCOTROL) 5 MG tablet Take 2 tablets by mouth with breakfast  and 1 tablet with dinner.  Marland Kitchen losartan (COZAAR) 50 MG tablet Take 1 tablet (50 mg total) by mouth daily.  . metFORMIN (GLUCOPHAGE) 1000 MG tablet Take 1 tablet (1,000 mg total) by mouth in the morning and at bedtime.   No facility-administered encounter medications on file as of 06/15/2020.     Review of Systems  Review of Systems  Constitutional: Negative.  Negative for fatigue and fever.  HENT: Negative.   Respiratory: Negative for cough and shortness of breath.   Cardiovascular: Negative.  Negative for chest pain, palpitations and leg  swelling.  Gastrointestinal: Negative.   Allergic/Immunologic: Negative.   Neurological: Negative.   Psychiatric/Behavioral: Negative.        Physical Exam  BP (!) 204/125 Comment: provider aware  Pulse 87   Temp 97.8 F (36.6 C) (Oral)   Resp 16   Wt 137 lb (62.1 kg)   SpO2 99%   BMI 27.67 kg/m   Wt Readings from Last 5 Encounters:  06/15/20 137 lb (62.1 kg)  01/05/20 140 lb (63.5 kg)  08/17/19 140 lb (63.5 kg)  06/23/19 145 lb (65.8 kg)  04/07/19 143 lb 3.2 oz (65 kg)     Physical Exam Vitals and nursing note reviewed.  Constitutional:      General: She is not in acute distress.    Appearance: She is well-developed.  Cardiovascular:     Rate and Rhythm: Normal rate and regular rhythm.  Pulmonary:     Effort: Pulmonary effort is normal.     Breath sounds: Normal breath sounds.  Musculoskeletal:     Right lower leg: No edema.     Left lower leg: No edema.  Neurological:     Mental Status: She is alert and oriented to person, place, and time.  Psychiatric:        Mood and Affect: Mood normal.        Behavior: Behavior normal.       Assessment & Plan:   Type 2 diabetes mellitus with diabetic neuropathy (HCC) Last A1c improved to 7.6. Discussed goal <7.0. Continue current regimen.  Counseled on Diabetic diet, my plate method, 017 minutes of moderate intensity exercise/week Blood sugar logs with fasting goals of 80-120 mg/dl, random of less than 793 and in the event of sugars less than 60 mg/dl or greater than 903 mg/dl encouraged to notify the clinic. Advised on the need for annual eye exams, annual foot exams, Pneumonia vaccine. - HgB A1c - HM Diabetes Foot Exam - Microalbumin/Creatinine Ratio, Urine - Comprehensive metabolic panel - CBC  2. Essential hypertension BP above goal. Patient asymptomatic. Continue Coreg 12.5 mg BID.  - Comprehensive metabolic panel - amLODipine (NORVASC) 10 MG tablet; Take 1 tablet (10 mg total) by mouth daily.   Dispense: 90 tablet; Refill: 0 - carvedilol (COREG) 12.5 MG tablet; Take 1 tablet (12.5 mg total) by mouth 2 (two) times daily with a meal.  Dispense: 60 tablet; Refill: 2 - losartan (COZAAR) 50 MG tablet; Take 1 tablet (50 mg total) by mouth daily.  Dispense: 90 tablet; Refill: 0  3. Mixed hyperlipidemia Continue Lipitor 10 mg.  Will order lipids  Follow up in 2 weeks for nurse recheck of BP  Follow up in 3 month with Dr. Othelia Pulling, NP 06/15/2020

## 2020-06-15 NOTE — Assessment & Plan Note (Addendum)
Last A1c improved to 7.6. Discussed goal <7.0. Continue current regimen.  Counseled on Diabetic diet, my plate method, 213 minutes of moderate intensity exercise/week Blood sugar logs with fasting goals of 80-120 mg/dl, random of less than 086 and in the event of sugars less than 60 mg/dl or greater than 578 mg/dl encouraged to notify the clinic. Advised on the need for annual eye exams, annual foot exams, Pneumonia vaccine. - HgB A1c - HM Diabetes Foot Exam - Microalbumin/Creatinine Ratio, Urine - Comprehensive metabolic panel - CBC  2. Essential hypertension BP above goal. Patient asymptomatic. Continue Coreg 12.5 mg BID.  - Comprehensive metabolic panel - amLODipine (NORVASC) 10 MG tablet; Take 1 tablet (10 mg total) by mouth daily.  Dispense: 90 tablet; Refill: 0 - carvedilol (COREG) 12.5 MG tablet; Take 1 tablet (12.5 mg total) by mouth 2 (two) times daily with a meal.  Dispense: 60 tablet; Refill: 2 - losartan (COZAAR) 50 MG tablet; Take 1 tablet (50 mg total) by mouth daily.  Dispense: 90 tablet; Refill: 0  3. Mixed hyperlipidemia Continue Lipitor 10 mg.  Will order lipids  Follow up in 2 weeks for nurse recheck of BP  Follow up in 3 month with Dr. Earlene Plater

## 2020-06-15 NOTE — Patient Instructions (Addendum)
1. Type 2 diabetes mellitus with diabetic neuropathy Last A1c improved to 7.6. Discussed goal <7.0. Continue current regimen.  Counseled on Diabetic diet, my plate method, 882 minutes of moderate intensity exercise/week Blood sugar logs with fasting goals of 80-120 mg/dl, random of less than 800 and in the event of sugars less than 60 mg/dl or greater than 349 mg/dl encouraged to notify the clinic. Advised on the need for annual eye exams, annual foot exams, Pneumonia vaccine. - HgB A1c - HM Diabetes Foot Exam - Microalbumin/Creatinine Ratio, Urine - Comprehensive metabolic panel - CBC  2. Essential hypertension BP above goal. Patient asymptomatic. Continue Coreg 12.5 mg BID.  - Comprehensive metabolic panel - amLODipine (NORVASC) 10 MG tablet; Take 1 tablet (10 mg total) by mouth daily.  Dispense: 90 tablet; Refill: 0 - carvedilol (COREG) 12.5 MG tablet; Take 1 tablet (12.5 mg total) by mouth 2 (two) times daily with a meal.  Dispense: 60 tablet; Refill: 0 - losartan (COZAAR) 50 MG tablet; Take 1 tablet (50 mg total) by mouth daily.  Dispense: 90 tablet; Refill: 0  Note: - advised to go home and take BP medications - we will call and check in on her for updated BP at home  3. Mixed hyperlipidemia Continue Lipitor 10 mg.  Will order lipids  Follow up in 2 weeks for nurse recheck of BP - take medications before appointment  Follow up in 3 month with Dr. Earlene Plater

## 2020-06-15 NOTE — Progress Notes (Signed)
Patient wanted to get lab test today  Checking cholesterol, liver, etc.  F/u BP

## 2020-06-16 LAB — COMPREHENSIVE METABOLIC PANEL
ALT: 24 IU/L (ref 0–32)
AST: 16 IU/L (ref 0–40)
Albumin/Globulin Ratio: 1.7 (ref 1.2–2.2)
Albumin: 4.3 g/dL (ref 3.8–4.8)
Alkaline Phosphatase: 84 IU/L (ref 44–121)
BUN/Creatinine Ratio: 16 (ref 9–23)
BUN: 12 mg/dL (ref 6–24)
Bilirubin Total: 0.5 mg/dL (ref 0.0–1.2)
CO2: 22 mmol/L (ref 20–29)
Calcium: 9.5 mg/dL (ref 8.7–10.2)
Chloride: 105 mmol/L (ref 96–106)
Creatinine, Ser: 0.76 mg/dL (ref 0.57–1.00)
Globulin, Total: 2.6 g/dL (ref 1.5–4.5)
Glucose: 201 mg/dL — ABNORMAL HIGH (ref 65–99)
Potassium: 4.1 mmol/L (ref 3.5–5.2)
Sodium: 141 mmol/L (ref 134–144)
Total Protein: 6.9 g/dL (ref 6.0–8.5)
eGFR: 95 mL/min/{1.73_m2} (ref >59)

## 2020-06-16 LAB — CBC
Hematocrit: 37.3 % (ref 34.0–46.6)
Hemoglobin: 13.2 g/dL (ref 11.1–15.9)
MCH: 30.3 pg (ref 26.6–33.0)
MCHC: 35.4 g/dL (ref 31.5–35.7)
MCV: 86 fL (ref 79–97)
Platelets: 232 10*3/uL (ref 150–450)
RBC: 4.36 x10E6/uL (ref 3.77–5.28)
RDW: 16 % — ABNORMAL HIGH (ref 11.7–15.4)
WBC: 6.7 10*3/uL (ref 3.4–10.8)

## 2020-06-16 LAB — LIPID PANEL
Chol/HDL Ratio: 4.1 ratio (ref 0.0–4.4)
Cholesterol, Total: 151 mg/dL (ref 100–199)
HDL: 37 mg/dL — ABNORMAL LOW (ref >39)
LDL Chol Calc (NIH): 78 mg/dL (ref 0–99)
Triglycerides: 218 mg/dL — ABNORMAL HIGH (ref 0–149)
VLDL Cholesterol Cal: 36 mg/dL (ref 5–40)

## 2020-06-16 LAB — HEMOGLOBIN A1C
Est. average glucose Bld gHb Est-mCnc: 177 mg/dL
Hgb A1c MFr Bld: 7.8 % — ABNORMAL HIGH (ref 4.8–5.6)

## 2020-06-16 LAB — MICROALBUMIN / CREATININE URINE RATIO
Creatinine, Urine: 79.5 mg/dL
Microalb/Creat Ratio: 629 mg/g creat — ABNORMAL HIGH (ref 0–29)
Microalbumin, Urine: 500.2 ug/mL

## 2020-06-20 ENCOUNTER — Telehealth: Payer: Self-pay | Admitting: Internal Medicine

## 2020-06-20 NOTE — Telephone Encounter (Signed)
Pt is calling asking for a call back about her lab results from 5.11.2022 visit.

## 2020-06-21 NOTE — Telephone Encounter (Signed)
Pt name and DOB verified. Patient aware of results and result note per Angus Seller, NP  Patient states BP is still high.  Pls advise.

## 2020-06-21 NOTE — Telephone Encounter (Signed)
Appears that when she saw Angus Seller that her BP was elevated and there was report of noncompliance with her medications. Please ensure she has been compliant. If BPs are elevated significantly (repeatedly >160/100) or if she has headaches, vision changes, chest pain with her BP elevation she will need to go to UC or ER. Otherwise, looks like plan was RN visit for f/u with BP. Please ensure this is scheduled.   Marcy Siren, D.O. Primary Care at [facility]  06/21/2020, 2:40 PM

## 2020-06-22 ENCOUNTER — Other Ambulatory Visit: Payer: Self-pay

## 2020-06-22 ENCOUNTER — Ambulatory Visit: Payer: Self-pay

## 2020-06-23 NOTE — Telephone Encounter (Signed)
Patient no showed the nurse visit for her BP check Has appt on 5/25 at 10:30 with PCP for PAP  Advised to f/u Monday for Nurse visit.   Pls advise on Microalbumin results

## 2020-06-23 NOTE — Telephone Encounter (Signed)
Result notes were sent to my nurse to call this patient regarding her results - please see note. Also, a follow up visit was scheduled for this week with your office for a repeat BP check.

## 2020-06-24 NOTE — Telephone Encounter (Signed)
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 Please call patient to discuss the importance of medication compliance for improvement of blood pressure. Continue current medications. Continue efforts (lifestyle changes)  to improve  control of blood sugar and blood pressure. Schedule follow-up for 1 month with PCP.Thank you.

## 2020-06-29 ENCOUNTER — Ambulatory Visit: Payer: Self-pay | Admitting: Internal Medicine

## 2020-07-06 NOTE — Telephone Encounter (Signed)
Patient no showed appt for 5/18 and 5/25 to f/u for BP and PAP. Pt name and DOB verified. Patient aware of results and result note per Angus Seller, NP regarding microalbumin.   Encouraged patient to f/u with Franky Macho, Clinical Pharmacist for BP as directed by PCP. She states she is will call to make appt. She states she has not had time to pick up the medicatoin for BP from [facility] pharmacy d/t work schedule.  Given phone number to pharmacy . Patient will call to see if Rx can be mailed.

## 2020-08-04 ENCOUNTER — Other Ambulatory Visit: Payer: Self-pay

## 2020-08-04 ENCOUNTER — Ambulatory Visit: Payer: Self-pay | Admitting: Physician Assistant

## 2020-08-04 MED FILL — Atorvastatin Calcium Tab 10 MG (Base Equivalent): ORAL | 30 days supply | Qty: 30 | Fill #0 | Status: AC

## 2020-08-04 MED FILL — Carvedilol Tab 12.5 MG: ORAL | 30 days supply | Qty: 60 | Fill #0 | Status: AC

## 2020-08-04 MED FILL — Glipizide Tab 5 MG: ORAL | 30 days supply | Qty: 90 | Fill #0 | Status: AC

## 2020-08-04 MED FILL — Losartan Potassium Tab 50 MG: ORAL | 30 days supply | Qty: 30 | Fill #0 | Status: AC

## 2020-08-04 MED FILL — Metformin HCl Tab 1000 MG: ORAL | 30 days supply | Qty: 60 | Fill #0 | Status: AC

## 2020-08-04 MED FILL — Gabapentin Cap 300 MG: ORAL | 30 days supply | Qty: 60 | Fill #0 | Status: AC

## 2020-08-04 MED FILL — Amlodipine Besylate Tab 10 MG (Base Equivalent): ORAL | 30 days supply | Qty: 30 | Fill #0 | Status: AC

## 2020-08-13 ENCOUNTER — Ambulatory Visit
Admission: EM | Admit: 2020-08-13 | Discharge: 2020-08-13 | Disposition: A | Discharge status: Home / Self Care | Payer: Self-pay | Attending: Emergency Medicine | Admitting: Emergency Medicine

## 2020-08-13 ENCOUNTER — Other Ambulatory Visit: Payer: Self-pay

## 2020-08-13 ENCOUNTER — Encounter: Payer: Self-pay | Admitting: Emergency Medicine

## 2020-08-13 DIAGNOSIS — R739 Hyperglycemia, unspecified: Secondary | ICD-10-CM

## 2020-08-13 DIAGNOSIS — R5383 Other fatigue: Secondary | ICD-10-CM

## 2020-08-13 DIAGNOSIS — R61 Generalized hyperhidrosis: Secondary | ICD-10-CM

## 2020-08-13 LAB — POCT URINALYSIS DIP (MANUAL ENTRY)
Bilirubin, UA: NEGATIVE
Glucose, UA: 500 mg/dL — AB
Ketones, POC UA: NEGATIVE mg/dL
Nitrite, UA: NEGATIVE
Protein Ur, POC: 100 mg/dL — AB
Spec Grav, UA: >=1.03 — AB (ref 1.010–1.025)
Urobilinogen, UA: 0.2 E.U./dL
pH, UA: 5.5 (ref 5.0–8.0)

## 2020-08-13 LAB — POCT FASTING CBG KUC MANUAL ENTRY: POCT Glucose (KUC): 348 mg/dL — AB (ref 70–99)

## 2020-08-13 MED ORDER — CEPHALEXIN 500 MG PO CAPS
500.0000 mg | ORAL_CAPSULE | Freq: Two times a day (BID) | ORAL | 0 refills | Status: AC
  Filled 2020-08-13 – 2020-09-07 (×2): qty 10, 5d supply, fill #0

## 2020-08-13 NOTE — Discharge Instructions (Addendum)
Blood sugar 348, blood pressure 166/94 Please be sure to continue to take blood pressure medicine and diabetes medicines Follow-up with primary care for follow-up of elevated sugars Be sure to drink plenty of fluids, avoid excess sugars in diet  Urine slightly suggestive of UTI-begin Keflex twice daily x5 days, urine culture pending  Please follow-up if any symptoms not improving or worsening

## 2020-08-13 NOTE — ED Provider Notes (Signed)
[facility]    CSN: [id number] Arrival date & time: 08/13/20  1350      History   Chief Complaint Chief Complaint  Patient presents with   Excessive Sweating    HPI Stephanie [name] is a 50 y.o. female history of hypertension, DM type II presenting today for evaluation of excessive sweating.  Reports that over the past week she has felt very hot even in cold environments.  She has had associated fatigue generalized weakness as well as associated dizziness and lightheadedness which she describes as spinning/presyncope.  Denies chest pain, shortness of breath.  Denies headache or vision changes.  Denies unilateral weakness.  Reports that she did not take her blood pressure medicine today.  Does believe that she has been taking it regularly.  She is unsure what her sugars have been running recently.  Denies urinary symptoms.  HPI  Past Medical History:  Diagnosis Date   Diabetes mellitus without complication (HCC)    Hypertension     Patient Active Problem List   Diagnosis Date Noted   Mixed hyperlipidemia 01/05/2020   Elevated liver enzymes 01/05/2020   Asymptomatic hypertensive urgency 01/05/2020   Diabetes mellitus (HCC) 01/05/2020   Type 2 diabetes mellitus with diabetic neuropathy (HCC) 08/14/2019   Abnormal EKG 04/13/2018   Elevated blood sugar 12/31/2017   Essential hypertension 12/31/2017    History reviewed. No pertinent surgical history.  OB History   No obstetric history on file.      Home Medications    Prior to Admission medications   Medication Sig Start Date End Date Taking? Authorizing Provider  cephALEXin (KEFLEX) 500 MG capsule Take 1 capsule (500 mg total) by mouth 2 (two) times daily for 5 days. 08/13/20 08/20/20 Yes Kaliq Lege C, PA-C  albuterol (VENTOLIN HFA) 108 (90 Base) MCG/ACT inhaler Inhale 2 puffs into the lungs every 6 (six) hours as needed for wheezing or shortness of breath. 03/25/20   Arvilla Market, MD   amLODipine (NORVASC) 10 MG tablet Take 1 tablet (10 mg total) by mouth daily. 06/15/20 09/13/20  Ivonne Andrew, NP  atorvastatin (LIPITOR) 10 MG tablet Take 1 tablet (10 mg total) by mouth daily. 03/25/20   Arvilla Market, MD  carvedilol (COREG) 12.5 MG tablet Take 1 tablet (12.5 mg total) by mouth 2 (two) times daily with a meal. 06/15/20 09/13/20  Ivonne Andrew, NP  gabapentin (NEURONTIN) 300 MG capsule Take 1 capsule (300 mg total) by mouth 2 (two) times daily. *NEEDS APPOINTMENT W/ PCP PRIOR TO ANY FURTHER REFILLS* 03/25/20   Arvilla Market, MD  glipiZIDE (GLUCOTROL) 5 MG tablet Take 2 tablets by mouth with breakfast and 1 tablet with dinner. 06/15/20   Ivonne Andrew, NP  losartan (COZAAR) 50 MG tablet Take 1 tablet (50 mg total) by mouth daily. 06/15/20 09/13/20  Ivonne Andrew, NP  metFORMIN (GLUCOPHAGE) 1000 MG tablet Take 1 tablet (1,000 mg total) by mouth in the morning and at bedtime. 06/15/20 09/13/20  Ivonne Andrew, NP  TRUE METRIX BLOOD GLUCOSE TEST test strip Use as instructed. Check blood glucose level by fingerstick twice per day. 10/13/18   Claiborne Rigg, NP  TRUEplus Lancets 28G MISC Use as instructed. Check blood glucose level by fingerstick twice per day. 10/13/18   Claiborne Rigg, NP    Family History History reviewed. No pertinent family history.  Social History Social History   Tobacco Use   Smoking status: Every Day  Packs/day: 0.50    Pack years: 0.00    Types: Cigarettes   Smokeless tobacco: Never  Vaping Use   Vaping Use: Never used  Substance Use Topics   Alcohol use: Yes    Alcohol/week: 6.0 standard drinks    Types: 6 Cans of beer per week    Comment: daily   Drug use: No     Allergies   Lisinopril   Review of Systems Review of Systems  Constitutional:  Positive for fatigue. Negative for fever.  HENT:  Negative for congestion, sinus pressure and sore throat.   Eyes:  Negative for photophobia, pain and visual  disturbance.  Respiratory:  Negative for cough and shortness of breath.   Cardiovascular:  Negative for chest pain.  Gastrointestinal:  Negative for abdominal pain, nausea and vomiting.  Endocrine: Positive for heat intolerance.  Genitourinary:  Negative for decreased urine volume and hematuria.  Musculoskeletal:  Negative for myalgias, neck pain and neck stiffness.  Neurological:  Positive for dizziness, weakness and light-headedness. Negative for syncope, facial asymmetry, speech difficulty, numbness and headaches.    Physical Exam Triage Vital Signs ED Triage Vitals [08/13/20 1504]  Enc Vitals Group     BP (!) 184/111     Pulse Rate 98     Resp 18     Temp 98.7 F (37.1 C)     Temp Source Oral     SpO2 96 %     Weight      Height      Head Circumference      Peak Flow      Pain Score 0     Pain Loc      Pain Edu?      Excl. in GC?    No data found.  Updated Vital Signs BP (!) 166/94 (BP Location: Left Arm)   Pulse 98   Temp 98.7 F (37.1 C) (Oral)   Resp 18   SpO2 96%   Visual Acuity Right Eye Distance:   Left Eye Distance:   Bilateral Distance:    Right Eye Near:   Left Eye Near:    Bilateral Near:     Physical Exam Vitals and nursing note reviewed.  Constitutional:      Appearance: She is well-developed.     Comments: No acute distress  HENT:     Head: Normocephalic and atraumatic.     Ears:     Comments: Bilateral ears without tenderness to palpation of external auricle, tragus and mastoid, EAC's without erythema or swelling, TM's with good bony landmarks and cone of light. Non erythematous.      Nose: Nose normal.     Mouth/Throat:     Comments: Oral mucosa pink and moist, no tonsillar enlargement or exudate. Posterior pharynx patent and nonerythematous, no uvula deviation or swelling. Normal phonation.  Eyes:     Conjunctiva/sclera: Conjunctivae normal.  Cardiovascular:     Rate and Rhythm: Normal rate and regular rhythm.  Pulmonary:      Effort: Pulmonary effort is normal. No respiratory distress.     Comments: Breathing comfortably at rest, CTABL, no wheezing, rales or other adventitious sounds auscultated  Abdominal:     General: There is no distension.  Musculoskeletal:        General: Normal range of motion.     Cervical back: Neck supple.  Skin:    General: Skin is warm and dry.  Neurological:     Mental Status: She is alert and oriented to  person, place, and time.     UC Treatments / Results  Labs (all labs ordered are listed, but only abnormal results are displayed) Labs Reviewed  POCT FASTING CBG [facility] MANUAL ENTRY - Abnormal; Notable for the following components:      Result Value   POCT Glucose ([facility]) 348 (*)    All other components within normal limits  POCT URINALYSIS DIP (MANUAL ENTRY) - Abnormal; Notable for the following components:   Clarity, UA cloudy (*)    Glucose, UA =500 (*)    Spec Grav, UA >=1.030 (*)    Blood, UA trace-intact (*)    Protein Ur, POC =100 (*)    Leukocytes, UA Small (1+) (*)    All other components within normal limits  URINE CULTURE    EKG   Radiology No results found.  Procedures Procedures (including critical care time)  Medications Ordered in UC Medications - No data to display  Initial Impression / Assessment and Plan / UC Course  I have reviewed the triage vital signs and the nursing notes.  Pertinent labs & imaging results that were available during my care of the patient were reviewed by me and considered in my medical decision making (see chart for details).  Clinical Course as of 08/15/20 0856  Sat Aug 13, 2020  1548 166/94 [HW]    Clinical Course User Index [HW] Lew Dawes, [place]    Hyperglycemia/uncontrolled DM type II-point-of-care blood sugar 348 today, no ketones in urine, stressed importance of diabetes meds, patient plans to contact PCP first thing Monday morning to set up follow-up appointment.  Advised to drink plenty fluids and  avoid excess sugars. Dizziness/fatigue-symptoms may be correlated to hyperglycemia, mild dehydration from hyperglycemia, slightly elevated blood pressure, patient recently had blood work approximately 1 month ago at PCP, Patient wished to defer further blood work at this time.  Advised strict adherence to meds and monitoring pressure and sugars. Possible UTI-small leuks in urine with consistent UA for UTI, urine culture pending.  Opting to go ahead and initiate on Keflex x5 days while waiting on results.  Discussed strict return precautions. Patient verbalized understanding and is agreeable with plan.  Final Clinical Impressions(s) / UC Diagnoses   Final diagnoses:  Excessive sweating  Hyperglycemia  Fatigue, unspecified type     Discharge Instructions      Blood sugar 348, blood pressure 166/94 Please be sure to continue to take blood pressure medicine and diabetes medicines Follow-up with primary care for follow-up of elevated sugars Be sure to drink plenty of fluids, avoid excess sugars in diet  Urine slightly suggestive of UTI-begin Keflex twice daily x5 days, urine culture pending  Please follow-up if any symptoms not improving or worsening     ED Prescriptions     Medication Sig Dispense Auth. Provider   cephALEXin (KEFLEX) 500 MG capsule Take 1 capsule (500 mg total) by mouth 2 (two) times daily for 5 days. 10 capsule Natalin Bible, [place] C, PA-C      PDMP not reviewed this encounter.   Lew Dawes, [place] 08/15/20 [phone]

## 2020-08-13 NOTE — ED Triage Notes (Signed)
Pt sts she "has been sweating and not feeling well" x 1 week; pt sts thinks her BP is elevated and is unsure about her diabetes; pt sts is taking her medication

## 2020-08-15 ENCOUNTER — Other Ambulatory Visit: Payer: Self-pay

## 2020-08-16 LAB — URINE CULTURE

## 2020-08-22 ENCOUNTER — Other Ambulatory Visit: Payer: Self-pay

## 2020-09-07 ENCOUNTER — Other Ambulatory Visit: Payer: Self-pay

## 2020-11-21 ENCOUNTER — Encounter (HOSPITAL_COMMUNITY): Payer: Self-pay

## 2020-11-21 ENCOUNTER — Other Ambulatory Visit: Payer: Self-pay

## 2020-11-21 ENCOUNTER — Emergency Department (HOSPITAL_COMMUNITY): Payer: Self-pay

## 2020-11-21 ENCOUNTER — Emergency Department (HOSPITAL_COMMUNITY)
Admission: EM | Admit: 2020-11-21 | Discharge: 2020-11-21 | Disposition: A | Discharge status: Home / Self Care | Payer: Self-pay | Attending: Emergency Medicine | Admitting: Emergency Medicine

## 2020-11-21 ENCOUNTER — Encounter: Payer: Self-pay | Admitting: Emergency Medicine

## 2020-11-21 DIAGNOSIS — E7800 Pure hypercholesterolemia, unspecified: Secondary | ICD-10-CM | POA: Insufficient documentation

## 2020-11-21 DIAGNOSIS — Z7984 Long term (current) use of oral hypoglycemic drugs: Secondary | ICD-10-CM | POA: Insufficient documentation

## 2020-11-21 DIAGNOSIS — Z79899 Other long term (current) drug therapy: Secondary | ICD-10-CM | POA: Insufficient documentation

## 2020-11-21 DIAGNOSIS — I10 Essential (primary) hypertension: Secondary | ICD-10-CM | POA: Insufficient documentation

## 2020-11-21 DIAGNOSIS — R310 Gross hematuria: Secondary | ICD-10-CM | POA: Insufficient documentation

## 2020-11-21 DIAGNOSIS — E119 Type 2 diabetes mellitus without complications: Secondary | ICD-10-CM | POA: Insufficient documentation

## 2020-11-21 DIAGNOSIS — F17210 Nicotine dependence, cigarettes, uncomplicated: Secondary | ICD-10-CM | POA: Insufficient documentation

## 2020-11-21 HISTORY — DX: Pure hypercholesterolemia, unspecified: E78.00

## 2020-11-21 LAB — URINALYSIS, ROUTINE W REFLEX MICROSCOPIC
Bilirubin Urine: NEGATIVE
Glucose, UA: >=500 mg/dL — AB
Ketones, ur: NEGATIVE mg/dL
Nitrite: NEGATIVE
Protein, ur: 100 mg/dL — AB
RBC / HPF: >50 RBC/hpf — ABNORMAL HIGH (ref 0–5)
Specific Gravity, Urine: 1.017 (ref 1.005–1.030)
WBC, UA: >50 WBC/hpf — ABNORMAL HIGH (ref 0–5)
pH: 5 (ref 5.0–8.0)

## 2020-11-21 LAB — I-STAT CHEM 8, ED
BUN: 9 mg/dL (ref 6–20)
Calcium, Ion: 1.26 mmol/L (ref 1.15–1.40)
Chloride: 102 mmol/L (ref 98–111)
Creatinine, Ser: 0.7 mg/dL (ref 0.44–1.00)
Glucose, Bld: 297 mg/dL — ABNORMAL HIGH (ref 70–99)
HCT: 36 % (ref 36.0–46.0)
Hemoglobin: 12.2 g/dL (ref 12.0–15.0)
Potassium: 3.7 mmol/L (ref 3.5–5.1)
Sodium: 137 mmol/L (ref 135–145)
TCO2: 24 mmol/L (ref 22–32)

## 2020-11-21 LAB — CBC WITH DIFFERENTIAL/PLATELET
Abs Immature Granulocytes: 0.06 10*3/uL (ref 0.00–0.07)
Basophils Absolute: 0 10*3/uL (ref 0.0–0.1)
Basophils Relative: 0 %
Eosinophils Absolute: 0.1 10*3/uL (ref 0.0–0.5)
Eosinophils Relative: 1 %
HCT: 39.6 % (ref 36.0–46.0)
Hemoglobin: 13.8 g/dL (ref 12.0–15.0)
Immature Granulocytes: 1 %
Lymphocytes Relative: 32 %
Lymphs Abs: 2.3 10*3/uL (ref 0.7–4.0)
MCH: 29.9 pg (ref 26.0–34.0)
MCHC: 34.8 g/dL (ref 30.0–36.0)
MCV: 85.9 fL (ref 80.0–100.0)
Monocytes Absolute: 0.5 10*3/uL (ref 0.1–1.0)
Monocytes Relative: 6 %
Neutro Abs: 4.4 10*3/uL (ref 1.7–7.7)
Neutrophils Relative %: 60 %
Platelets: 219 10*3/uL (ref 150–400)
RBC: 4.61 MIL/uL (ref 3.87–5.11)
RDW: 16.4 % — ABNORMAL HIGH (ref 11.5–15.5)
WBC: 7.3 10*3/uL (ref 4.0–10.5)
nRBC: 0 % (ref 0.0–0.2)

## 2020-11-21 LAB — COMPREHENSIVE METABOLIC PANEL
ALT: 21 U/L (ref 0–44)
AST: 21 U/L (ref 15–41)
Albumin: 4 g/dL (ref 3.5–5.0)
Alkaline Phosphatase: 74 U/L (ref 38–126)
Anion gap: 9 (ref 5–15)
BUN: 11 mg/dL (ref 6–20)
CO2: 22 mmol/L (ref 22–32)
Calcium: 8.9 mg/dL (ref 8.9–10.3)
Chloride: 102 mmol/L (ref 98–111)
Creatinine, Ser: 0.91 mg/dL (ref 0.44–1.00)
GFR, Estimated: >60 mL/min (ref >60)
Glucose, Bld: 374 mg/dL — ABNORMAL HIGH (ref 70–99)
Potassium: 3.6 mmol/L (ref 3.5–5.1)
Sodium: 133 mmol/L — ABNORMAL LOW (ref 135–145)
Total Bilirubin: 0.6 mg/dL (ref 0.3–1.2)
Total Protein: 7.3 g/dL (ref 6.5–8.1)

## 2020-11-21 LAB — PREGNANCY, URINE: Preg Test, Ur: NEGATIVE

## 2020-11-21 LAB — I-STAT BETA HCG BLOOD, ED (MC, WL, AP ONLY): I-stat hCG, quantitative: 5 m[IU]/mL — ABNORMAL HIGH (ref <5)

## 2020-11-21 LAB — LIPASE, BLOOD: Lipase: 28 U/L (ref 11–51)

## 2020-11-21 MED ORDER — IOHEXOL 350 MG/ML SOLN
80.0000 mL | Freq: Once | INTRAVENOUS | Status: AC | PRN
  Administered 2020-11-21: 80 mL via INTRAVENOUS

## 2020-11-21 MED ORDER — MORPHINE SULFATE (PF) 4 MG/ML IV SOLN
4.0000 mg | Freq: Once | INTRAVENOUS | Status: AC
  Administered 2020-11-21: 4 mg via INTRAVENOUS
  Filled 2020-11-21: qty 1

## 2020-11-21 MED ORDER — SODIUM CHLORIDE 0.9 % IV BOLUS
1000.0000 mL | Freq: Once | INTRAVENOUS | Status: AC
  Administered 2020-11-21: 1000 mL via INTRAVENOUS

## 2020-11-21 MED ORDER — ONDANSETRON HCL 4 MG/2ML IJ SOLN
4.0000 mg | Freq: Once | INTRAMUSCULAR | Status: AC
  Administered 2020-11-21: 4 mg via INTRAVENOUS
  Filled 2020-11-21: qty 2
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
## 2020-11-21 NOTE — ED Notes (Signed)
Patient has a urine culture in the main lab 

## 2020-11-21 NOTE — Discharge Instructions (Signed)
Follow up with the urologist in the office.
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
## 2021-01-30 ENCOUNTER — Emergency Department (HOSPITAL_COMMUNITY): Payer: Self-pay

## 2021-01-30 ENCOUNTER — Emergency Department (HOSPITAL_COMMUNITY)
Admission: EM | Admit: 2021-01-30 | Discharge: 2021-01-30 | Disposition: A | Discharge status: Home / Self Care | Payer: Self-pay | Attending: Emergency Medicine | Admitting: Emergency Medicine

## 2021-01-30 ENCOUNTER — Other Ambulatory Visit: Payer: Self-pay

## 2021-01-30 ENCOUNTER — Encounter (HOSPITAL_COMMUNITY): Payer: Self-pay

## 2021-01-30 DIAGNOSIS — Z5321 Procedure and treatment not carried out due to patient leaving prior to being seen by health care provider: Secondary | ICD-10-CM | POA: Insufficient documentation

## 2021-01-30 DIAGNOSIS — R519 Headache, unspecified: Secondary | ICD-10-CM | POA: Insufficient documentation

## 2021-01-30 LAB — CBC WITH DIFFERENTIAL/PLATELET
Abs Immature Granulocytes: 0.05 10*3/uL (ref 0.00–0.07)
Basophils Absolute: 0 10*3/uL (ref 0.0–0.1)
Basophils Relative: 1 %
Eosinophils Absolute: 0 10*3/uL (ref 0.0–0.5)
Eosinophils Relative: 0 %
HCT: 43.9 % (ref 36.0–46.0)
Hemoglobin: 15.5 g/dL — ABNORMAL HIGH (ref 12.0–15.0)
Immature Granulocytes: 1 %
Lymphocytes Relative: 40 %
Lymphs Abs: 2.9 10*3/uL (ref 0.7–4.0)
MCH: 29.2 pg (ref 26.0–34.0)
MCHC: 35.3 g/dL (ref 30.0–36.0)
MCV: 82.7 fL (ref 80.0–100.0)
Monocytes Absolute: 0.6 10*3/uL (ref 0.1–1.0)
Monocytes Relative: 8 %
Neutro Abs: 3.6 10*3/uL (ref 1.7–7.7)
Neutrophils Relative %: 50 %
Platelets: 277 10*3/uL (ref 150–400)
RBC: 5.31 MIL/uL — ABNORMAL HIGH (ref 3.87–5.11)
RDW: 17.6 % — ABNORMAL HIGH (ref 11.5–15.5)
WBC: 7.2 10*3/uL (ref 4.0–10.5)
nRBC: 0 % (ref 0.0–0.2)

## 2021-01-30 LAB — COMPREHENSIVE METABOLIC PANEL
ALT: 25 U/L (ref 0–44)
AST: 23 U/L (ref 15–41)
Albumin: 4.1 g/dL (ref 3.5–5.0)
Alkaline Phosphatase: 87 U/L (ref 38–126)
Anion gap: 9 (ref 5–15)
BUN: 15 mg/dL (ref 6–20)
CO2: 21 mmol/L — ABNORMAL LOW (ref 22–32)
Calcium: 8.7 mg/dL — ABNORMAL LOW (ref 8.9–10.3)
Chloride: 103 mmol/L (ref 98–111)
Creatinine, Ser: 1.13 mg/dL — ABNORMAL HIGH (ref 0.44–1.00)
GFR, Estimated: 59 mL/min — ABNORMAL LOW (ref >60)
Glucose, Bld: 329 mg/dL — ABNORMAL HIGH (ref 70–99)
Potassium: 3.3 mmol/L — ABNORMAL LOW (ref 3.5–5.1)
Sodium: 133 mmol/L — ABNORMAL LOW (ref 135–145)
Total Bilirubin: 0.9 mg/dL (ref 0.3–1.2)
Total Protein: 7.6 g/dL (ref 6.5–8.1)

## 2021-01-30 LAB — C-REACTIVE PROTEIN: CRP: 0.5 mg/dL (ref <1.0)

## 2021-01-30 LAB — SEDIMENTATION RATE: Sed Rate: 0 mm/hr (ref 0–22)

## 2021-01-30 NOTE — ED Notes (Signed)
Parke Poisson, PA aware pts BP 202/124.

## 2021-01-30 NOTE — ED Provider Notes (Signed)
Emergency Medicine Provider Triage Evaluation Note ° °Stephanie [name] , a 50 y.o. female  was evaluated in triage.  Pt complains of "knot," to her left temple.  States that this has been present x1 week.  Patient denies any recent falls or injuries. ° °Patient reports that she has been taking her blood pressure medications as prescribed.  States that her blood pressure is always high.  Denies any chest pain, shortness of breath, visual disturbance.  Does endorse slight headache ° °Review of Systems  °Positive: Facial swelling and tenderness, headache °Negative: Chest pain, shortness of breath, visual disturbance, numbness, weakness ° °Physical Exam  °BP (!) 202/124    Pulse 98    Temp 98.8 °F (37.1 °C) (Oral)    Resp 18    SpO2 100%  °Gen:   Awake, no distress   °Resp:  Normal effort  °MSK:   Moves extremities without difficulty  °Other:  No swelling or abscess noted to patient's face.  Patient does have tenderness over her left temple. ° °Medical Decision Making  °Medically screening exam initiated at 3:11 PM.  Appropriate orders placed.  Stephanie [name] was informed that the remainder of the evaluation will be completed by another provider, this initial triage assessment does not replace that evaluation, and the importance of remaining in the ED until their evaluation is complete. ° °Due to patient's tenderness over her left temple will obtain ESR and CRP.  Due to her marked hypertension we will check lab work, EKG, and chest x-ray. °  °Badalamente, Peter R, PA-C °01/30/21 1513 ° °  °Horton, Kristie M, DO °01/31/21 1647 ° °

## 2021-01-30 NOTE — ED Triage Notes (Signed)
Pt c/o painful knot to her left side of face x1 week. Pt states she has been taking her BP meds as prescribed, BP 202/124, pt states her BP is always high.

## 2021-02-02 ENCOUNTER — Other Ambulatory Visit: Payer: Self-pay | Admitting: Internal Medicine

## 2021-02-02 ENCOUNTER — Other Ambulatory Visit: Payer: Self-pay

## 2021-02-02 MED FILL — Albuterol Sulfate Inhal Aero 108 MCG/ACT (90MCG Base Equiv): RESPIRATORY_TRACT | 25 days supply | Qty: 18 | Fill #0 | Status: AC

## 2021-02-06 ENCOUNTER — Other Ambulatory Visit: Payer: Self-pay

## 2021-03-02 ENCOUNTER — Other Ambulatory Visit: Payer: Self-pay

## 2021-03-02 ENCOUNTER — Encounter: Payer: Self-pay | Admitting: Emergency Medicine

## 2021-03-02 ENCOUNTER — Ambulatory Visit
Admission: EM | Admit: 2021-03-02 | Discharge: 2021-03-02 | Disposition: A | Discharge status: Home / Self Care | Payer: Self-pay | Attending: Nurse Practitioner | Admitting: Nurse Practitioner

## 2021-03-02 DIAGNOSIS — H6981 Other specified disorders of Eustachian tube, right ear: Secondary | ICD-10-CM

## 2021-03-02 MED ORDER — FLUTICASONE PROPIONATE 50 MCG/ACT NA SUSP
2.0000 | Freq: Every day | NASAL | 0 refills | Status: DC
  Filled 2021-03-02: qty 16, 30d supply, fill #0

## 2021-03-02 MED ORDER — PSEUDOEPHEDRINE HCL ER 120 MG PO TB12
120.0000 mg | ORAL_TABLET | Freq: Two times a day (BID) | ORAL | 0 refills | Status: DC
  Filled 2021-03-02: qty 20, 10d supply, fill #0

## 2021-03-02 NOTE — Discharge Instructions (Addendum)
I believe that you have a condition called eustachian tube dysfunction.   Common symptoms include:  A feeling of fullness in the ear. Ear pain. Clicking or popping noises in the ear. Ringing in the ear (tinnitus). Hearing loss. Loss of balance. Dizziness.  Take the medications as prescribed   You should also perform techniques to help pop your ears, such as:  Chewing gum. Yawning. Frequent, forceful swallowing. Closing your mouth, holding your nose closed, and gently blowing as if you are trying to blow air out of your nose.  Do not travel to high altitudes, fly in airplanes, work in a Estate agent or room, scuba dive, get water in the ears or smoke   Go to the ED immediately if you have: A fever. Sever pain in your ear. Pain in your head or neck. Fluid draining from your ear.

## 2021-03-02 NOTE — ED Triage Notes (Signed)
Felt like something in right ear x 5 days. States she can't hear out of right ear. Ear canals clear bilaterally, possibly irritated, possible fluid bubble visualized behind left TM

## 2021-03-02 NOTE — ED Provider Notes (Signed)
[facility]    CSN: [id number] Arrival date & time: 03/02/21  1141      History   Chief Complaint Chief Complaint  Patient presents with   Foreign Body in Ear    HPI Stephanie [name] is a 51 y.o. female.   Subjective:   Stephanie [name] is a 51 y.o. female who presents for possible right ear infection. Symptoms include fullness in the ear, popping noises, tinnitus, decreased hearing, dizziness and nausea. Onset of symptoms was 1 week ago and has been unchanged since that time. She denies any pain but reports that these symptoms are aggravating overall.  She denies any fevers, discharge from the ear, recent URI, headache or neck pain.  He has not had any recent travel.  She has not tried anything for her symptoms.   The following portions of the patient's history were reviewed and updated as appropriate: allergies, current medications, past family history, past medical history, past social history, past surgical history, and problem list.     Past Medical History:  Diagnosis Date   Diabetes mellitus without complication ([place])    High cholesterol    Hypertension     Patient Active Problem List   Diagnosis Date Noted   High cholesterol 11/21/2020   Mixed hyperlipidemia 01/05/2020   Elevated liver enzymes 01/05/2020   Asymptomatic hypertensive urgency 01/05/2020   Diabetes mellitus ([place]) 01/05/2020   Type 2 diabetes mellitus with diabetic neuropathy ([place]) 08/14/2019   Abnormal EKG 04/13/2018   Elevated blood sugar 12/31/2017   Essential hypertension 12/31/2017    History reviewed. No pertinent surgical history.  OB History   No obstetric history on file.      Home Medications    Prior to Admission medications   Medication Sig Start Date End Date Taking? Authorizing Provider  fluticasone (FLONASE) 50 MCG/ACT nasal spray Place 2 sprays into both nostrils daily. 03/02/21  Yes Enrique Sack, FNP  pseudoephedrine (SUDAFED 12 HOUR) 120 MG 12 hr  tablet Take 1 tablet (120 mg total) by mouth 2 (two) times daily. 03/02/21  Yes Enrique Sack, FNP  albuterol (VENTOLIN HFA) 108 (90 Base) MCG/ACT inhaler Inhale 2 puffs into the lungs every 6 (six) hours as needed for wheezing or shortness of breath. 03/25/20   Nicolette Bang, MD  albuterol (VENTOLIN HFA) 108 (90 Base) MCG/ACT inhaler INHALE 2 PUFFS INTO THE LUNGS EVERY 6 (SIX) HOURS AS NEEDED FOR WHEEZING OR SHORTNESS OF BREATH. 03/25/20 03/25/21  Nicolette Bang, MD  amLODipine (NORVASC) 10 MG tablet TAKE 1 TABLET (10 MG TOTAL) BY MOUTH DAILY. 03/25/20 03/25/21  Nicolette Bang, MD  amLODipine (NORVASC) 10 MG tablet TAKE 1 TABLET (10 MG TOTAL) BY MOUTH DAILY. 01/05/20 01/04/21  Mayers, Cari S, PA-C  amLODipine (NORVASC) 10 MG tablet Take 1 tablet (10 mg total) by mouth daily. 06/15/20 03/04/21  Fenton Foy, NP  atorvastatin (LIPITOR) 10 MG tablet Take 1 tablet (10 mg total) by mouth daily. 03/25/20   Nicolette Bang, MD  atorvastatin (LIPITOR) 10 MG tablet TAKE 1 TABLET (10 MG TOTAL) BY MOUTH DAILY. 03/25/20 03/25/21  Nicolette Bang, MD  atorvastatin (LIPITOR) 10 MG tablet TAKE 1 TABLET (10 MG TOTAL) BY MOUTH DAILY. 01/05/20 01/04/21  Mayers, Cari S, PA-C  carvedilol (COREG) 12.5 MG tablet TAKE 1 TABLET (12.5 MG TOTAL) BY MOUTH 2 (TWO) TIMES DAILY WITH A MEAL. 03/25/20 03/25/21  Nicolette Bang, MD  carvedilol (COREG) 12.5 MG tablet TAKE 1 TABLET (12.5 MG  TOTAL) BY MOUTH 2 (TWO) TIMES DAILY WITH A MEAL. 01/05/20 01/04/21  Mayers, Cari S, PA-C  carvedilol (COREG) 12.5 MG tablet Take 1 tablet (12.5 mg total) by mouth 2 (two) times daily with a meal. 06/15/20 03/04/21  Fenton Foy, NP  gabapentin (NEURONTIN) 300 MG capsule Take 1 capsule (300 mg total) by mouth 2 (two) times daily. *NEEDS APPOINTMENT W/ PCP PRIOR TO ANY FURTHER REFILLS* 03/25/20   Nicolette Bang, MD  gabapentin (NEURONTIN) 300 MG capsule TAKE 1 CAPSULE (300 MG TOTAL)  BY MOUTH 2 (TWO) TIMES DAILY. *NEEDS APPOINTMENT W/ PCP PRIOR TO ANY FURTHER REFILLS* 03/25/20 03/25/21  Nicolette Bang, MD  gabapentin (NEURONTIN) 300 MG capsule TAKE 1 CAPSULE (300 MG TOTAL) BY MOUTH 2 (TWO) TIMES DAILY. 01/05/20 01/04/21  Mayers, Cari S, PA-C  glipiZIDE (GLUCOTROL) 5 MG tablet TAKE 2 TABLETS BY MOUTH WITH BREAKFAST, AND 1 TABLET WITH DINNER. 03/25/20 03/25/21  Nicolette Bang, MD  glipiZIDE (GLUCOTROL) 5 MG tablet TAKE 2 TABLETS BY MOUTH WITH BREAKFAST AND 1 TABLET WITH DINNER. 01/05/20 01/04/21  Mayers, Cari S, PA-C  glipiZIDE (GLUCOTROL) 5 MG tablet Take 2 tablets by mouth with breakfast and 1 tablet with dinner. 06/15/20   Fenton Foy, NP  losartan (COZAAR) 50 MG tablet TAKE 1 TABLET (50 MG TOTAL) BY MOUTH DAILY. 03/25/20 03/25/21  Nicolette Bang, MD  losartan (COZAAR) 50 MG tablet TAKE 1 TABLET (50 MG TOTAL) BY MOUTH DAILY. 01/05/20 01/04/21  Mayers, Cari S, PA-C  losartan (COZAAR) 50 MG tablet Take 1 tablet (50 mg total) by mouth daily. 06/15/20 03/04/21  Fenton Foy, NP  metFORMIN (GLUCOPHAGE) 1000 MG tablet TAKE 1 TABLET (1,000 MG TOTAL) BY MOUTH IN THE MORNING AND AT BEDTIME. 03/25/20 03/25/21  Nicolette Bang, MD  metFORMIN (GLUCOPHAGE) 1000 MG tablet TAKE 1 TABLET (1,000 MG TOTAL) BY MOUTH IN THE MORNING AND 1 TABLET AT BEDTIME. 01/05/20 01/04/21  Mayers, Cari S, PA-C  metFORMIN (GLUCOPHAGE) 1000 MG tablet Take 1 tablet (1,000 mg total) by mouth in the morning and at bedtime. 06/15/20 09/13/20  Fenton Foy, NP  TRUE METRIX BLOOD GLUCOSE TEST test strip Use as instructed. Check blood glucose level by fingerstick twice per day. 10/13/18   Gildardo Pounds, NP  TRUEplus Lancets 28G MISC Use as instructed. Check blood glucose level by fingerstick twice per day. 10/13/18   Gildardo Pounds, NP    Family History Family History  Problem Relation Age of Onset   Hypertension Mother    Diabetes Mother     Social History Social History    Tobacco Use   Smoking status: Every Day    Packs/day: 0.25    Types: Cigarettes   Smokeless tobacco: Never  Vaping Use   Vaping Use: Never used  Substance Use Topics   Alcohol use: Yes    Alcohol/week: 6.0 standard drinks    Types: 6 Cans of beer per week   Drug use: No     Allergies   Lisinopril   Review of Systems Review of Systems  Constitutional:  Negative for fever.  HENT:  Positive for hearing loss and tinnitus. Negative for ear discharge, ear pain, facial swelling, sinus pressure, sinus pain, sneezing, sore throat and trouble swallowing.   Eyes:  Negative for visual disturbance.  Respiratory:  Negative for cough.   Gastrointestinal:  Negative for nausea and vomiting.  Musculoskeletal:  Negative for neck pain and neck stiffness.  Neurological:  Positive for dizziness. Negative for  tremors, syncope, speech difficulty, weakness, numbness and headaches.  All other systems reviewed and are negative.   Physical Exam Triage Vital Signs ED Triage Vitals [03/02/21 1435]  Enc Vitals Group     BP      Pulse      Resp      Temp      Temp src      SpO2      Weight      Height      Head Circumference      Peak Flow      Pain Score 0     Pain Loc      Pain Edu?      Excl. in [place]?    No data found.  Updated Vital Signs There were no vitals taken for this visit.  Visual Acuity Right Eye Distance:   Left Eye Distance:   Bilateral Distance:    Right Eye Near:   Left Eye Near:    Bilateral Near:     Physical Exam   UC Treatments / Results  Labs (all labs ordered are listed, but only abnormal results are displayed) Labs Reviewed - No data to display  EKG   Radiology No results found.  Procedures Procedures (including critical care time)  Medications Ordered in UC Medications - No data to display  Initial Impression / Assessment and Plan / UC Course  I have reviewed the triage vital signs and the nursing notes.  Pertinent labs & imaging  results that were available during my care of the patient were reviewed by me and considered in my medical decision making (see chart for details).    51 year old female presenting with a 1 week history of fullness in the right ear, popping, tinnitus, decreased hearing, dizziness and nausea.  Patient is afebrile and nontoxic-appearing.  Physical exam unremarkable for any acute findings within the ear.  Symptoms likely due to and a station tube dysfunction.  Discussed diagnoses.  Will treat with Flonase and Sudafed.  Also recommended supportive measures.  She should follow-up with ENT if symptoms fail to improve.    Today's evaluation has revealed no signs of a dangerous process. Discussed diagnosis with patient and/or guardian. Patient and/or guardian aware of their diagnosis, possible red flag symptoms to watch out for and need for close follow up. Patient and/or guardian understands verbal and written discharge instructions. Patient and/or guardian comfortable with plan and disposition.  Patient and/or guardian has a clear mental status at this time, good insight into illness (after discussion and teaching) and has clear judgment to make decisions regarding their care  This care was provided during an unprecedented National Emergency due to the Novel Coronavirus (COVID-19) pandemic. COVID-19 infections and transmission risks place heavy strains on healthcare resources.  As this pandemic evolves, our facility, providers, and staff strive to respond fluidly, to remain operational, and to provide care relative to available resources and information. Outcomes are unpredictable and treatments are without well-defined guidelines. Further, the impact of COVID-19 on all aspects of urgent care, including the impact to patients seeking care for reasons other than COVID-19, is unavoidable during this national emergency. At this time of the global pandemic, management of patients has significantly changed, even for  non-COVID positive patients given high local and regional COVID volumes at this time requiring high healthcare system and resource utilization. The standard of care for management of both COVID suspected and non-COVID suspected patients continues to change rapidly at the local, regional, national, and  global levels. This patient was worked up and treated to the best available but ever changing evidence and resources available at this current time.   Documentation was completed with the aid of voice recognition software. Transcription may contain typographical errors. Final Clinical Impressions(s) / UC Diagnoses   Final diagnoses:  Eustachian tube dysfunction, right     Discharge Instructions      I believe that you have a condition called eustachian tube dysfunction.   Common symptoms include:  A feeling of fullness in the ear. Ear pain. Clicking or popping noises in the ear. Ringing in the ear (tinnitus). Hearing loss. Loss of balance. Dizziness.  Take the medications as prescribed   You should also perform techniques to help pop your ears, such as:  Chewing gum. Yawning. Frequent, forceful swallowing. Closing your mouth, holding your nose closed, and gently blowing as if you are trying to blow air out of your nose.  Do not travel to high altitudes, fly in airplanes, work in a Pension scheme manager or room, scuba dive, get water in the ears or smoke   Go to the ED immediately if you have: A fever. Sever pain in your ear. Pain in your head or neck. Fluid draining from your ear.     ED Prescriptions     Medication Sig Dispense Auth. Provider   fluticasone (FLONASE) 50 MCG/ACT nasal spray Place 2 sprays into both nostrils daily. 16 g Enrique Sack, FNP   pseudoephedrine (SUDAFED 12 HOUR) 120 MG 12 hr tablet Take 1 tablet (120 mg total) by mouth 2 (two) times daily. 20 tablet Enrique Sack, FNP      PDMP not reviewed this encounter.   Enrique Sack,  [place] 03/02/21 1525

## 2021-03-09 ENCOUNTER — Other Ambulatory Visit: Payer: Self-pay

## 2021-07-11 ENCOUNTER — Encounter (HOSPITAL_COMMUNITY): Payer: Self-pay

## 2021-07-11 ENCOUNTER — Emergency Department (HOSPITAL_COMMUNITY): Payer: Self-pay

## 2021-07-11 ENCOUNTER — Inpatient Hospital Stay (HOSPITAL_COMMUNITY)
Admission: EM | Admit: 2021-07-11 | Discharge: 2021-07-13 | DRG: 066 | Disposition: A | Discharge status: Home / Self Care | Payer: Self-pay | Attending: Internal Medicine | Admitting: Internal Medicine

## 2021-07-11 DIAGNOSIS — Z833 Family history of diabetes mellitus: Secondary | ICD-10-CM

## 2021-07-11 DIAGNOSIS — F1910 Other psychoactive substance abuse, uncomplicated: Secondary | ICD-10-CM

## 2021-07-11 DIAGNOSIS — I6521 Occlusion and stenosis of right carotid artery: Secondary | ICD-10-CM | POA: Diagnosis present

## 2021-07-11 DIAGNOSIS — F1210 Cannabis abuse, uncomplicated: Secondary | ICD-10-CM | POA: Diagnosis present

## 2021-07-11 DIAGNOSIS — E1151 Type 2 diabetes mellitus with diabetic peripheral angiopathy without gangrene: Secondary | ICD-10-CM | POA: Diagnosis present

## 2021-07-11 DIAGNOSIS — E1165 Type 2 diabetes mellitus with hyperglycemia: Secondary | ICD-10-CM | POA: Diagnosis present

## 2021-07-11 DIAGNOSIS — I6381 Other cerebral infarction due to occlusion or stenosis of small artery: Principal | ICD-10-CM | POA: Diagnosis present

## 2021-07-11 DIAGNOSIS — F1410 Cocaine abuse, uncomplicated: Secondary | ICD-10-CM | POA: Diagnosis present

## 2021-07-11 DIAGNOSIS — E1140 Type 2 diabetes mellitus with diabetic neuropathy, unspecified: Secondary | ICD-10-CM | POA: Diagnosis present

## 2021-07-11 DIAGNOSIS — Z8249 Family history of ischemic heart disease and other diseases of the circulatory system: Secondary | ICD-10-CM

## 2021-07-11 DIAGNOSIS — Z7984 Long term (current) use of oral hypoglycemic drugs: Secondary | ICD-10-CM

## 2021-07-11 DIAGNOSIS — G43909 Migraine, unspecified, not intractable, without status migrainosus: Secondary | ICD-10-CM | POA: Diagnosis present

## 2021-07-11 DIAGNOSIS — T465X6A Underdosing of other antihypertensive drugs, initial encounter: Secondary | ICD-10-CM | POA: Diagnosis present

## 2021-07-11 DIAGNOSIS — I160 Hypertensive urgency: Secondary | ICD-10-CM

## 2021-07-11 DIAGNOSIS — I10 Essential (primary) hypertension: Secondary | ICD-10-CM | POA: Diagnosis present

## 2021-07-11 DIAGNOSIS — H538 Other visual disturbances: Secondary | ICD-10-CM | POA: Diagnosis present

## 2021-07-11 DIAGNOSIS — I639 Cerebral infarction, unspecified: Principal | ICD-10-CM

## 2021-07-11 DIAGNOSIS — Z79899 Other long term (current) drug therapy: Secondary | ICD-10-CM

## 2021-07-11 DIAGNOSIS — E876 Hypokalemia: Secondary | ICD-10-CM | POA: Diagnosis present

## 2021-07-11 DIAGNOSIS — F1090 Alcohol use, unspecified, uncomplicated: Secondary | ICD-10-CM | POA: Diagnosis present

## 2021-07-11 DIAGNOSIS — Z91199 Patient's noncompliance with other medical treatment and regimen due to unspecified reason: Secondary | ICD-10-CM

## 2021-07-11 DIAGNOSIS — Z888 Allergy status to other drugs, medicaments and biological substances status: Secondary | ICD-10-CM

## 2021-07-11 DIAGNOSIS — E7800 Pure hypercholesterolemia, unspecified: Secondary | ICD-10-CM | POA: Diagnosis present

## 2021-07-11 DIAGNOSIS — F17210 Nicotine dependence, cigarettes, uncomplicated: Secondary | ICD-10-CM | POA: Diagnosis present

## 2021-07-11 LAB — COMPREHENSIVE METABOLIC PANEL
ALT: 26 U/L (ref 0–44)
AST: 26 U/L (ref 15–41)
Albumin: 3.4 g/dL — ABNORMAL LOW (ref 3.5–5.0)
Alkaline Phosphatase: 78 U/L (ref 38–126)
Anion gap: 9 (ref 5–15)
BUN: 13 mg/dL (ref 6–20)
CO2: 21 mmol/L — ABNORMAL LOW (ref 22–32)
Calcium: 8.7 mg/dL — ABNORMAL LOW (ref 8.9–10.3)
Chloride: 109 mmol/L (ref 98–111)
Creatinine, Ser: 1.08 mg/dL — ABNORMAL HIGH (ref 0.44–1.00)
GFR, Estimated: >60 mL/min (ref >60)
Glucose, Bld: 241 mg/dL — ABNORMAL HIGH (ref 70–99)
Potassium: 3.2 mmol/L — ABNORMAL LOW (ref 3.5–5.1)
Sodium: 139 mmol/L (ref 135–145)
Total Bilirubin: 0.8 mg/dL (ref 0.3–1.2)
Total Protein: 6.5 g/dL (ref 6.5–8.1)

## 2021-07-11 LAB — RAPID URINE DRUG SCREEN, HOSP PERFORMED
Amphetamines: NOT DETECTED
Barbiturates: NOT DETECTED
Benzodiazepines: NOT DETECTED
Cocaine: POSITIVE — AB
Opiates: NOT DETECTED
Tetrahydrocannabinol: POSITIVE — AB

## 2021-07-11 LAB — CBC
HCT: 37.8 % (ref 36.0–46.0)
Hemoglobin: 13.1 g/dL (ref 12.0–15.0)
MCH: 29.5 pg (ref 26.0–34.0)
MCHC: 34.7 g/dL (ref 30.0–36.0)
MCV: 85.1 fL (ref 80.0–100.0)
Platelets: 229 10*3/uL (ref 150–400)
RBC: 4.44 MIL/uL (ref 3.87–5.11)
RDW: 17.1 % — ABNORMAL HIGH (ref 11.5–15.5)
WBC: 6.2 10*3/uL (ref 4.0–10.5)
nRBC: 0 % (ref 0.0–0.2)

## 2021-07-11 LAB — SEDIMENTATION RATE: Sed Rate: 0 mm/hr (ref 0–22)

## 2021-07-11 LAB — TROPONIN I (HIGH SENSITIVITY): Troponin I (High Sensitivity): 3 ng/L (ref <18)

## 2021-07-11 LAB — GLUCOSE, CAPILLARY: Glucose-Capillary: 288 mg/dL — ABNORMAL HIGH (ref 70–99)

## 2021-07-11 MED ORDER — INSULIN DETEMIR 100 UNIT/ML ~~LOC~~ SOLN
10.0000 [IU] | Freq: Two times a day (BID) | SUBCUTANEOUS | Status: DC
  Administered 2021-07-11 – 2021-07-13 (×4): 10 [IU] via SUBCUTANEOUS
  Filled 2021-07-11 (×5): qty 0.1

## 2021-07-11 MED ORDER — SENNOSIDES-DOCUSATE SODIUM 8.6-50 MG PO TABS: 1.0000 | ORAL_TABLET | Freq: Every evening | ORAL | Status: DC | PRN

## 2021-07-11 MED ORDER — METOCLOPRAMIDE HCL 5 MG/ML IJ SOLN
10.0000 mg | Freq: Once | INTRAMUSCULAR | Status: AC
  Administered 2021-07-11: 10 mg via INTRAVENOUS
  Filled 2021-07-11: qty 2

## 2021-07-11 MED ORDER — CARVEDILOL 12.5 MG PO TABS
12.5000 mg | ORAL_TABLET | Freq: Once | ORAL | Status: AC
  Administered 2021-07-11: 12.5 mg via ORAL
  Filled 2021-07-11: qty 1

## 2021-07-11 MED ORDER — CLOPIDOGREL BISULFATE 75 MG PO TABS
75.0000 mg | ORAL_TABLET | Freq: Every day | ORAL | Status: DC
Start: 2021-07-11 — End: 2021-07-13
  Administered 2021-07-11 – 2021-07-13 (×3): 75 mg via ORAL
  Filled 2021-07-11 (×3): qty 1

## 2021-07-11 MED ORDER — INSULIN ASPART 100 UNIT/ML IJ SOLN
0.0000 [IU] | Freq: Every day | INTRAMUSCULAR | Status: DC
  Administered 2021-07-11 – 2021-07-12 (×2): 3 [IU] via SUBCUTANEOUS

## 2021-07-11 MED ORDER — CARVEDILOL 6.25 MG PO TABS
6.2500 mg | ORAL_TABLET | Freq: Two times a day (BID) | ORAL | Status: DC
  Administered 2021-07-12 – 2021-07-13 (×3): 6.25 mg via ORAL
  Filled 2021-07-11 (×3): qty 1

## 2021-07-11 MED ORDER — INSULIN ASPART 100 UNIT/ML IJ SOLN
0.0000 [IU] | Freq: Three times a day (TID) | INTRAMUSCULAR | Status: DC
  Administered 2021-07-12 (×2): 3 [IU] via SUBCUTANEOUS
  Administered 2021-07-12: 8 [IU] via SUBCUTANEOUS
  Administered 2021-07-13: 5 [IU] via SUBCUTANEOUS

## 2021-07-11 MED ORDER — ACETAMINOPHEN 325 MG PO TABS
650.0000 mg | ORAL_TABLET | ORAL | Status: DC | PRN
  Administered 2021-07-11: 650 mg via ORAL
  Filled 2021-07-11: qty 2

## 2021-07-11 MED ORDER — INSULIN ASPART 100 UNIT/ML IJ SOLN
4.0000 [IU] | Freq: Three times a day (TID) | INTRAMUSCULAR | Status: DC
  Administered 2021-07-12 – 2021-07-13 (×4): 4 [IU] via SUBCUTANEOUS

## 2021-07-11 MED ORDER — ASPIRIN 81 MG PO TBEC
81.0000 mg | DELAYED_RELEASE_TABLET | Freq: Every day | ORAL | Status: DC
Start: 2021-07-11 — End: 2021-07-13
  Administered 2021-07-11 – 2021-07-13 (×3): 81 mg via ORAL
  Filled 2021-07-11 (×3): qty 1

## 2021-07-11 MED ORDER — AMLODIPINE BESYLATE 5 MG PO TABS
10.0000 mg | ORAL_TABLET | Freq: Once | ORAL | Status: AC
  Administered 2021-07-11: 10 mg via ORAL
  Filled 2021-07-11: qty 2

## 2021-07-11 MED ORDER — ACETAMINOPHEN 160 MG/5ML PO SOLN: 650.0000 mg | ORAL | Status: DC | PRN

## 2021-07-11 MED ORDER — GABAPENTIN 300 MG PO CAPS
300.0000 mg | ORAL_CAPSULE | Freq: Two times a day (BID) | ORAL | Status: DC
  Administered 2021-07-11 – 2021-07-13 (×4): 300 mg via ORAL
  Filled 2021-07-11 (×4): qty 1

## 2021-07-11 MED ORDER — ATORVASTATIN CALCIUM 40 MG PO TABS
40.0000 mg | ORAL_TABLET | Freq: Every day | ORAL | Status: DC
  Administered 2021-07-11 – 2021-07-13 (×3): 40 mg via ORAL
  Filled 2021-07-11 (×3): qty 1

## 2021-07-11 MED ORDER — ACETAMINOPHEN 650 MG RE SUPP: 650.0000 mg | RECTAL | Status: DC | PRN

## 2021-07-11 MED ORDER — ENOXAPARIN SODIUM 40 MG/0.4ML IJ SOSY
40.0000 mg | PREFILLED_SYRINGE | INTRAMUSCULAR | Status: DC
  Administered 2021-07-11 – 2021-07-12 (×2): 40 mg via SUBCUTANEOUS
  Filled 2021-07-11 (×2): qty 0.4

## 2021-07-11 MED ORDER — FLUTICASONE PROPIONATE 50 MCG/ACT NA SUSP
2.0000 | Freq: Every day | NASAL | Status: DC
  Administered 2021-07-12 – 2021-07-13 (×2): 2 via NASAL
  Filled 2021-07-11: qty 16

## 2021-07-11 MED ORDER — STROKE: EARLY STAGES OF RECOVERY BOOK
Freq: Once | Status: AC
  Filled 2021-07-11: qty 1

## 2021-07-11 MED ORDER — CLOPIDOGREL BISULFATE 75 MG PO TABS
225.0000 mg | ORAL_TABLET | Freq: Once | ORAL | Status: AC
  Administered 2021-07-11: 225 mg via ORAL
  Filled 2021-07-11: qty 3

## 2021-07-11 MED ORDER — LOSARTAN POTASSIUM 25 MG PO TABS
50.0000 mg | ORAL_TABLET | Freq: Once | ORAL | Status: AC
  Administered 2021-07-11: 50 mg via ORAL
  Filled 2021-07-11: qty 2

## 2021-07-11 MED ORDER — SODIUM CHLORIDE 0.9 % IV SOLN: INTRAVENOUS | Status: AC

## 2021-07-11 NOTE — ED Notes (Signed)
[facility] arrived, receiving RN updated on status

## 2021-07-11 NOTE — ED Triage Notes (Signed)
Pt arrives POV c/o blurred vision and headache. Pt states that blurred vision began yesterday at 1100. Pt states that she has little to no compliance with BP medications. NIH 0 in triage. VAN neg.

## 2021-07-11 NOTE — Progress Notes (Signed)
Pt arrived from [facility] with Stroke diagnosis, alert and oriented, c/o of slight headache, pt settled in bed with call light at bedside, tele monitor put and verified on pt, safety concern addressed accordingly, pt reassured and will continue to monitor, v/s stable. [place], Lavone Barrientes Efe

## 2021-07-11 NOTE — Consult Note (Signed)
Neurology Consultation Reason for Consult: stroke Referring Physician: Rancour, S  CC: Vision Difficulty  History is obtained from: Patient  HPI: Stephanie [name] is a 51 y.o. female who was in her normal state of health until yesterday when she started noticing blurred vision.  She states that it is mostly on the right side, but it does not matter which eye she covers her vision is blurred on the right side out of either eye.  She complains of a headache as well and this has been persistent for the past couple of days.  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  She denies numbness, weakness, or other symptoms.    ROS: A 14 point ROS was performed and is negative except as noted in the HPI.   Past Medical History:  Diagnosis Date   Diabetes mellitus without complication ([facility])    High cholesterol    Hypertension      Family History  Problem Relation Age of Onset   Hypertension Mother    Diabetes Mother      Social History:  reports that she has been smoking cigarettes. She has been smoking an average of .25 packs per day. She has never used smokeless tobacco. She reports current alcohol use of about 6.0 standard drinks per week. She reports current drug use. Drug: Cocaine.   Exam: Current vital signs: BP (!) 165/88   Pulse 86   Temp 98.2 F (36.8 C) (Oral)   Resp (!) 21   SpO2 95%  Vital signs in last 24 hours: Temp:  [98.2 F (36.8 C)-98.4 F (36.9 C)] 98.2 F (36.8 C) (06/06 1810) Pulse Rate:  [84-92] 86 (06/06 1930) Resp:  [16-24] 21 (06/06 1930) BP: (114-192)/(85-114) 165/88 (06/06 1930) SpO2:  [95 %-100 %] 95 % (06/06 1930)   Physical Exam  Constitutional: Appears well-developed and well-nourished.  Psych: Affect appropriate to situation Eyes: No scleral injection HENT: No OP obstruction MSK: no joint deformities.  Cardiovascular:  Normal rate and regular rhythm.  Respiratory: Effort normal, non-labored breathing GI: Soft.  No distension. There is no tenderness.  Skin: WDI  Neuro: Mental Status: Patient is awake, alert, oriented to person, place, month, year, and situation. Patient is able to give a clear and coherent history. No signs of aphasia or neglect Cranial Nerves: II: Visual Fields are full. Pupils are equal, round, and reactive to light.   III,IV, VI: EOMI without ptosis or diploplia.  V: Facial sensation is symmetric to temperature VII: Facial movement is symmetric.  VIII: hearing is intact to voice X: Uvula elevates symmetrically XI: Shoulder shrug is symmetric. XII: tongue is midline without atrophy or fasciculations.  Motor: Tone is normal. Bulk is normal. 5/5 strength was present in all four extremities.  Sensory: Sensation is symmetric to light touch and temperature in the arms and legs. Deep Tendon Reflexes: 2+ and symmetric in the biceps and patellae.  Plantars: Toes are downgoing bilaterally.  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  The  stroke is high to be affecting the optic tract, and could be incidental, but appears to be a small vessel infarct and she will need secondary risk factor modification.  It is possible that she has a migraine, though difficult to completely discount the stroke is playing a role.  Recommendations: - HgbA1c, fasting lipid panel - Frequent neuro checks - Echocardiogram - CTA head and neck - Prophylactic therapy-Antiplatelet  med: Aspirin - dose 81mg  and plavix 75mg  daily  after 300mg  load  - Risk factor modification - Telemetry monitoring - PT consult, OT consult, Speech consult - will give reglan 10mg  IV x 1 in case migraine is playing a role.  - Stroke team to follow    , MD [facility] [phone]  If 7pm- 7am, please page neurology on call as listed in AMION.

## 2021-07-11 NOTE — H&P (Addendum)
History and Physical  Stephanie [name] ZOX:[id number] DOB: 1970-10-26 DOA: 07/11/2021  PCP: Pcp, No Patient coming from: Home  I have personally briefly reviewed patient's old medical records in [facility]   Chief Complaint: Blurry vision  HPI: Stephanie [name] is a 51 y.o. female past medical history of diabetes mellitus type 2, essential hypertension noncompliance with any of her medication, ongoing tobacco abuse and cocaine abuse who comes into the hospital for blurry vision that started on Saturday.  She relates she started taking her blood pressure medication 4 days prior to admission and her blurry vision improved. Only took it for 1 day then yesterday and today has been having permanent blurry vision she denies any dizziness.  Denies any extremity weakness.  In the ED: She was found to have a blood pressure of 187/114, heart rate of 84, mildly hypokalemia sed rate of 0, UDS was positive for cocaine and marijuana CT of the head showed low-density in the corpus callosum, MRI of the brain showed acute left basal ganglia infarct involving the anterior limb of the internal capsule and loss of right MCA flow.   Review of Systems: All systems reviewed and apart from history of presenting illness, are negative.  Past Medical History:  Diagnosis Date   Diabetes mellitus without complication (HCC)    High cholesterol    Hypertension    History reviewed. No pertinent surgical history. Social History:  reports that she has been smoking cigarettes. She has been smoking an average of .25 packs per day. She has never used smokeless tobacco. She reports current alcohol use of about 6.0 standard drinks per week. She reports current drug use. Drug: Cocaine.   Allergies  Allergen Reactions   Lisinopril Cough    Family History  Problem Relation Age of Onset   Hypertension Mother    Diabetes Mother     Prior to Admission medications   Medication Sig Start Date End Date  Taking? Authorizing Provider  albuterol (VENTOLIN HFA) 108 (90 Base) MCG/ACT inhaler Inhale 2 puffs into the lungs every 6 (six) hours as needed for wheezing or shortness of breath. 03/25/20   Arvilla Market, MD  albuterol (VENTOLIN HFA) 108 (90 Base) MCG/ACT inhaler INHALE 2 PUFFS INTO THE LUNGS EVERY 6 (SIX) HOURS AS NEEDED FOR WHEEZING OR SHORTNESS OF BREATH. 03/25/20 03/25/21  Arvilla Market, MD  amLODipine (NORVASC) 10 MG tablet TAKE 1 TABLET (10 MG TOTAL) BY MOUTH DAILY. 03/25/20 03/25/21  Arvilla Market, MD  amLODipine (NORVASC) 10 MG tablet TAKE 1 TABLET (10 MG TOTAL) BY MOUTH DAILY. 01/05/20 01/04/21  Mayers, Cari S, PA-C  amLODipine (NORVASC) 10 MG tablet Take 1 tablet (10 mg total) by mouth daily. 06/15/20 03/04/21  Ivonne Andrew, NP  atorvastatin (LIPITOR) 10 MG tablet Take 1 tablet (10 mg total) by mouth daily. 03/25/20   Arvilla Market, MD  atorvastatin (LIPITOR) 10 MG tablet TAKE 1 TABLET (10 MG TOTAL) BY MOUTH DAILY. 03/25/20 03/25/21  Arvilla Market, MD  atorvastatin (LIPITOR) 10 MG tablet TAKE 1 TABLET (10 MG TOTAL) BY MOUTH DAILY. 01/05/20 01/04/21  Mayers, Cari S, PA-C  carvedilol (COREG) 12.5 MG tablet TAKE 1 TABLET (12.5 MG TOTAL) BY MOUTH 2 (TWO) TIMES DAILY WITH A MEAL. 03/25/20 03/25/21  Arvilla Market, MD  carvedilol (COREG) 12.5 MG tablet TAKE 1 TABLET (12.5 MG TOTAL) BY MOUTH 2 (TWO) TIMES DAILY WITH A MEAL. 01/05/20 01/04/21  Mayers, Cari S, PA-C  carvedilol (COREG)  12.5 MG tablet Take 1 tablet (12.5 mg total) by mouth 2 (two) times daily with a meal. 06/15/20 03/04/21  Ivonne AndrewNichols, Tonya S, NP  fluticasone (FLONASE) 50 MCG/ACT nasal spray Place 2 sprays into both nostrils daily. 03/02/21   Lurline IdolMurrill, Samantha, FNP  gabapentin (NEURONTIN) 300 MG capsule Take 1 capsule (300 mg total) by mouth 2 (two) times daily. *NEEDS APPOINTMENT W/ PCP PRIOR TO ANY FURTHER REFILLS* 03/25/20   Arvilla MarketWallace, Bedelia Lauren, MD  gabapentin  (NEURONTIN) 300 MG capsule TAKE 1 CAPSULE (300 MG TOTAL) BY MOUTH 2 (TWO) TIMES DAILY. *NEEDS APPOINTMENT W/ PCP PRIOR TO ANY FURTHER REFILLS* 03/25/20 03/25/21  Arvilla MarketWallace, Kaydie Lauren, MD  gabapentin (NEURONTIN) 300 MG capsule TAKE 1 CAPSULE (300 MG TOTAL) BY MOUTH 2 (TWO) TIMES DAILY. 01/05/20 01/04/21  Mayers, Cari S, PA-C  glipiZIDE (GLUCOTROL) 5 MG tablet TAKE 2 TABLETS BY MOUTH WITH BREAKFAST, AND 1 TABLET WITH DINNER. 03/25/20 03/25/21  Arvilla MarketWallace, Georgianne Lauren, MD  glipiZIDE (GLUCOTROL) 5 MG tablet TAKE 2 TABLETS BY MOUTH WITH BREAKFAST AND 1 TABLET WITH DINNER. 01/05/20 01/04/21  Mayers, Cari S, PA-C  glipiZIDE (GLUCOTROL) 5 MG tablet Take 2 tablets by mouth with breakfast and 1 tablet with dinner. 06/15/20   Ivonne AndrewNichols, Tonya S, NP  losartan (COZAAR) 50 MG tablet TAKE 1 TABLET (50 MG TOTAL) BY MOUTH DAILY. 03/25/20 03/25/21  Arvilla MarketWallace, Makita Lauren, MD  losartan (COZAAR) 50 MG tablet TAKE 1 TABLET (50 MG TOTAL) BY MOUTH DAILY. 01/05/20 01/04/21  Mayers, Cari S, PA-C  losartan (COZAAR) 50 MG tablet Take 1 tablet (50 mg total) by mouth daily. 06/15/20 03/04/21  Ivonne AndrewNichols, Tonya S, NP  metFORMIN (GLUCOPHAGE) 1000 MG tablet TAKE 1 TABLET (1,000 MG TOTAL) BY MOUTH IN THE MORNING AND AT BEDTIME. 03/25/20 03/25/21  Arvilla MarketWallace, Kerstyn Lauren, MD  metFORMIN (GLUCOPHAGE) 1000 MG tablet TAKE 1 TABLET (1,000 MG TOTAL) BY MOUTH IN THE MORNING AND 1 TABLET AT BEDTIME. 01/05/20 01/04/21  Mayers, Cari S, PA-C  metFORMIN (GLUCOPHAGE) 1000 MG tablet Take 1 tablet (1,000 mg total) by mouth in the morning and at bedtime. 06/15/20 09/13/20  Ivonne AndrewNichols, Tonya S, NP  pseudoephedrine (SUDAFED 12 HOUR) 120 MG 12 hr tablet Take 1 tablet (120 mg total) by mouth 2 (two) times daily. 03/02/21   Lurline IdolMurrill, Samantha, FNP  TRUE METRIX BLOOD GLUCOSE TEST test strip Use as instructed. Check blood glucose level by fingerstick twice per day. 10/13/18   Claiborne RiggFleming, Zelda W, NP  TRUEplus Lancets 28G MISC Use as instructed. Check blood glucose level by  fingerstick twice per day. 10/13/18   Claiborne RiggFleming, Zelda W, NP   Physical Exam: Vitals:   07/11/21 1400 07/11/21 1401 07/11/21 1405 07/11/21 1514  BP: (!) 169/96 (!) 169/96 (!) 169/96 (!) 158/108  Pulse:  91 92 84  Resp: (!) 24  (!) 22 (!) 21  Temp:      TempSrc:      SpO2:   100% 95%    General exam: Moderately built and nourished patient, lying comfortably supine on the gurney in no obvious distress. Head, eyes and ENT: Nontraumatic and normocephalic.  Neck: Supple. No JVD, carotid bruit or thyromegaly. Lymphatics: No lymphadenopathy. Respiratory system: Clear to auscultation. No increased work of breathing. Cardiovascular system: S1 and S2 heard, RRR. No JVD. Gastrointestinal system: Abdomen is nondistended, soft and nontender. Normal bowel sounds heard. No organomegaly or masses appreciated. Central nervous system: Alert and oriented.  3-12 are grossly intact sensation is intact throughout Extremities: Symmetric 5 x 5 power. Peripheral pulses symmetrically  felt.  Skin: No rashes or acute findings. Musculoskeletal system: Negative exam. Psychiatry: Pleasant and cooperative.   Labs on Admission:  Basic Metabolic Panel: Recent Labs  Lab 07/11/21 1100  NA 139  K 3.2*  CL 109  CO2 21*  GLUCOSE 241*  BUN 13  CREATININE 1.08*  CALCIUM 8.7*   Liver Function Tests: Recent Labs  Lab 07/11/21 1100  AST 26  ALT 26  ALKPHOS 78  BILITOT 0.8  PROT 6.5  ALBUMIN 3.4*   No results for input(s): LIPASE, AMYLASE in the last 168 hours. No results for input(s): AMMONIA in the last 168 hours. CBC: Recent Labs  Lab 07/11/21 1100  WBC 6.2  HGB 13.1  HCT 37.8  MCV 85.1  PLT 229   Cardiac Enzymes: No results for input(s): CKTOTAL, CKMB, CKMBINDEX, TROPONINI in the last 168 hours.  BNP (last 3 results) No results for input(s): PROBNP in the last 8760 hours. CBG: No results for input(s): GLUCAP in the last 168 hours.  Radiological Exams on Admission: CT Head Wo  Contrast  Result Date: 07/11/2021 CLINICAL DATA:  Head trauma, GCS=15, severe headache EXAM: CT HEAD WITHOUT CONTRAST TECHNIQUE: Contiguous axial images were obtained from the base of the skull through the vertex without intravenous contrast. RADIATION DOSE REDUCTION: This exam was performed according to the departmental dose-optimization program which includes automated exposure control, adjustment of the mA and/or kV according to patient size and/or use of iterative reconstruction technique. COMPARISON:  None Available. FINDINGS: Brain: There is no acute intracranial hemorrhage or mass effect. Gray-white differentiation is preserved. Suspect focal hypoattenuation along the splenium of the corpus callosum extending primarily into the left pericallosal white matter. There is no extra-axial fluid collection. Ventricles and sulci are within normal limits in size and configuration. Vascular: There is atherosclerotic calcification at the skull base. Skull: Calvarium is unremarkable. Sinuses/Orbits: No acute finding. Other: None. IMPRESSION: Age-indeterminate low-density along the splenium of the corpus callosum extending into the adjacent white matter. This may reflect gliosis with no expansion or mass effect to suggest a lesion, but MRI is recommended for further evaluation. Electronically Signed   By: Guadlupe Spanish M.D.   On: 07/11/2021 11:17   MR BRAIN WO CONTRAST  Result Date: 07/11/2021 CLINICAL DATA:  Blurred vision and headache.  Hypertension EXAM: MRI HEAD WITHOUT CONTRAST TECHNIQUE: Multiplanar, multiecho pulse sequences of the brain and surrounding structures were obtained without intravenous contrast. COMPARISON:  No prior MRI, correlation is made with CT head 07/11/2021 FINDINGS: Evaluation is somewhat limited by motion artifact. Brain: Focus of restricted diffusion in the left basal ganglia (series 5, image 78) with ADC correlate (series 6, image 30), consistent with acute or subacute infarct, which  measures up to 6 mm in greatest dimension. This may involve the anterior limb of the internal capsule. This is associated with increased T2 signal. No acute hemorrhage, mass, mass effect, or midline shift. No hydrocephalus or extra-axial collection. Scattered T2 hyperintense signal in the periventricular white matter, likely the sequela of chronic small vessel ischemic disease. Vascular: Loss of the right ICA flow void (series 8, image 7-9). Otherwise normal arterial flow voids. Skull and upper cervical spine: Limited evaluation due to motion. Sinuses/Orbits: No acute finding. Other: The mastoids are well aerated. IMPRESSION: 1. Evaluation is somewhat limited by motion artifact. Within this limitation, there is an acute or subacute infarct in the left basal ganglia, which may involve the anterior limb of the internal capsule. 2. Loss of the right ICA flow void,  which could indicate poor flow or occlusion. A CTA of the head and neck is recommended for further evaluation. These results were called by telephone at the time of interpretation on 07/11/2021 at 3:20 pm to provider [facility] , who verbally acknowledged these results. Electronically Signed   By: Wiliam Ke M.D.   On: 07/11/2021 15:20    EKG: Independently reviewed.  Sinus rhythm normal axis T wave inversions in inferior leads, nonspecific T wave changes in the lateral leads.  Assessment/Plan Acute CVA (cerebrovascular accident) (HCC) Check HgbA1c, fasting lipid panel  MRI of the brain as above. Check a swallowing evaluation. We will check a CT angio of the head and neck to evaluate for her carotid and her right ICA low-flow PT, OT, Speech consult  Check Transthoracic Echo She was not previously on aspirin will start on aspirin and Plavix . Atorvastatin 40  BP goal: permissive HTN upto 220/120 mmHg, hold all antihypertensive medication except Coreg. Telemetry monitoring The ED physician has consulted neurology on-call, requested  admission to [facility].  Essential hypertension We will allow permissive hypertension. Hold all antihypertensive medication except Coreg.  Type 2 diabetes mellitus with diabetic neuropathy (HCC) Hold glipizide and metformin we will start on long-acting insulin plus sliding scale. Check an A1c.  High cholesterol Continue statins.  Polysubstance abuse: UDS is positive for cocaine she has been counseled     DVT Prophylaxis: lovenox Code Status: Full  Family Communication: none  Disposition Plan: Inpatient      It is my clinical opinion that admission to INPATIENT is reasonable and necessary in this 51 y.o. female past medical history of diabetes and hypertension noncompliant with her medication ongoing tobacco abuse and polysubstance abuse including cocaine who comes in for an acute CVA.  Given the aforementioned, the predictability of an adverse outcome is felt to be significant. I expect that the patient will require at least 2 midnights in the hospital to treat this condition.  Marinda Elk MD [facility]   07/11/2021, 3:48 PM

## 2021-07-11 NOTE — ED Notes (Signed)
[facility] called at this time.  

## 2021-07-11 NOTE — ED Provider Notes (Signed)
[facility]-EMERGENCY DEPT Provider Note   CSN: [id number] Arrival date & time: 07/11/21  1020     History  Chief Complaint  Patient presents with   Hypertension   Blurred Vision    Stephanie [name] is a 51 y.o. female.  Patient with a history of hypertension and diabetes who has not had any medications for several months and does not check her blood sugars at home.  Reports today while at work she developed a diffuse gradual onset headache and is concerned that her blood pressure is high.  States she has not had her blood pressure medication in months but did take it yesterday.  Describes headache is gradual in onset.  Denies thunderclap.  Did have some blurry vision earlier which is since resolved.  Denies any double vision.  No focal weakness, numbness or tingling.  No dizziness or lightheadedness.  No chest pain or shortness of breath.  No difficulty speaking or difficulty swallowing.  Several days ago she had some black spots in her vision that lasted for just a few seconds but that has since resolved.  Her vision is normal currently.  There is no weakness in her arms or legs.  No numbness or tingling.  No chest pain or shortness of breath.  The history is provided by the patient.  Hypertension Associated symptoms include headaches. Pertinent negatives include no chest pain, no abdominal pain and no shortness of breath.      Home Medications Prior to Admission medications   Medication Sig Start Date End Date Taking? Authorizing Provider  albuterol (VENTOLIN HFA) 108 (90 Base) MCG/ACT inhaler Inhale 2 puffs into the lungs every 6 (six) hours as needed for wheezing or shortness of breath. 03/25/20   Arvilla MarketWallace, Nataley Lauren, MD  albuterol (VENTOLIN HFA) 108 (90 Base) MCG/ACT inhaler INHALE 2 PUFFS INTO THE LUNGS EVERY 6 (SIX) HOURS AS NEEDED FOR WHEEZING OR SHORTNESS OF BREATH. 03/25/20 03/25/21  Arvilla MarketWallace, Ondine Lauren, MD  amLODipine (NORVASC) 10 MG tablet  TAKE 1 TABLET (10 MG TOTAL) BY MOUTH DAILY. 03/25/20 03/25/21  Arvilla MarketWallace, Delailah Lauren, MD  amLODipine (NORVASC) 10 MG tablet TAKE 1 TABLET (10 MG TOTAL) BY MOUTH DAILY. 01/05/20 01/04/21  Mayers, Cari S, PA-C  amLODipine (NORVASC) 10 MG tablet Take 1 tablet (10 mg total) by mouth daily. 06/15/20 03/04/21  Ivonne AndrewNichols, Tonya S, NP  atorvastatin (LIPITOR) 10 MG tablet Take 1 tablet (10 mg total) by mouth daily. 03/25/20   Arvilla MarketWallace, Jakeira Lauren, MD  atorvastatin (LIPITOR) 10 MG tablet TAKE 1 TABLET (10 MG TOTAL) BY MOUTH DAILY. 03/25/20 03/25/21  Arvilla MarketWallace, Layken Lauren, MD  atorvastatin (LIPITOR) 10 MG tablet TAKE 1 TABLET (10 MG TOTAL) BY MOUTH DAILY. 01/05/20 01/04/21  Mayers, Cari S, PA-C  carvedilol (COREG) 12.5 MG tablet TAKE 1 TABLET (12.5 MG TOTAL) BY MOUTH 2 (TWO) TIMES DAILY WITH A MEAL. 03/25/20 03/25/21  Arvilla MarketWallace, Ellowyn Lauren, MD  carvedilol (COREG) 12.5 MG tablet TAKE 1 TABLET (12.5 MG TOTAL) BY MOUTH 2 (TWO) TIMES DAILY WITH A MEAL. 01/05/20 01/04/21  Mayers, Cari S, PA-C  carvedilol (COREG) 12.5 MG tablet Take 1 tablet (12.5 mg total) by mouth 2 (two) times daily with a meal. 06/15/20 03/04/21  Ivonne AndrewNichols, Tonya S, NP  fluticasone (FLONASE) 50 MCG/ACT nasal spray Place 2 sprays into both nostrils daily. 03/02/21   Lurline IdolMurrill, Samantha, FNP  gabapentin (NEURONTIN) 300 MG capsule Take 1 capsule (300 mg total) by mouth 2 (two) times daily. *NEEDS APPOINTMENT W/ PCP PRIOR TO ANY  FURTHER REFILLS* 03/25/20   Arvilla Market, MD  gabapentin (NEURONTIN) 300 MG capsule TAKE 1 CAPSULE (300 MG TOTAL) BY MOUTH 2 (TWO) TIMES DAILY. *NEEDS APPOINTMENT W/ PCP PRIOR TO ANY FURTHER REFILLS* 03/25/20 03/25/21  Arvilla Market, MD  gabapentin (NEURONTIN) 300 MG capsule TAKE 1 CAPSULE (300 MG TOTAL) BY MOUTH 2 (TWO) TIMES DAILY. 01/05/20 01/04/21  Mayers, Cari S, PA-C  glipiZIDE (GLUCOTROL) 5 MG tablet TAKE 2 TABLETS BY MOUTH WITH BREAKFAST, AND 1 TABLET WITH DINNER. 03/25/20 03/25/21  Arvilla Market, MD  glipiZIDE (GLUCOTROL) 5 MG tablet TAKE 2 TABLETS BY MOUTH WITH BREAKFAST AND 1 TABLET WITH DINNER. 01/05/20 01/04/21  Mayers, Cari S, PA-C  glipiZIDE (GLUCOTROL) 5 MG tablet Take 2 tablets by mouth with breakfast and 1 tablet with dinner. 06/15/20   Ivonne Andrew, NP  losartan (COZAAR) 50 MG tablet TAKE 1 TABLET (50 MG TOTAL) BY MOUTH DAILY. 03/25/20 03/25/21  Arvilla Market, MD  losartan (COZAAR) 50 MG tablet TAKE 1 TABLET (50 MG TOTAL) BY MOUTH DAILY. 01/05/20 01/04/21  Mayers, Cari S, PA-C  losartan (COZAAR) 50 MG tablet Take 1 tablet (50 mg total) by mouth daily. 06/15/20 03/04/21  Ivonne Andrew, NP  metFORMIN (GLUCOPHAGE) 1000 MG tablet TAKE 1 TABLET (1,000 MG TOTAL) BY MOUTH IN THE MORNING AND AT BEDTIME. 03/25/20 03/25/21  Arvilla Market, MD  metFORMIN (GLUCOPHAGE) 1000 MG tablet TAKE 1 TABLET (1,000 MG TOTAL) BY MOUTH IN THE MORNING AND 1 TABLET AT BEDTIME. 01/05/20 01/04/21  Mayers, Cari S, PA-C  metFORMIN (GLUCOPHAGE) 1000 MG tablet Take 1 tablet (1,000 mg total) by mouth in the morning and at bedtime. 06/15/20 09/13/20  Ivonne Andrew, NP  pseudoephedrine (SUDAFED 12 HOUR) 120 MG 12 hr tablet Take 1 tablet (120 mg total) by mouth 2 (two) times daily. 03/02/21   Lurline Idol, FNP  TRUE METRIX BLOOD GLUCOSE TEST test strip Use as instructed. Check blood glucose level by fingerstick twice per day. 10/13/18   Claiborne Rigg, NP  TRUEplus Lancets 28G MISC Use as instructed. Check blood glucose level by fingerstick twice per day. 10/13/18   Claiborne Rigg, NP      Allergies    Lisinopril    Review of Systems   Review of Systems  Constitutional:  Negative for appetite change, fatigue and fever.  HENT:  Negative for congestion and rhinorrhea.   Eyes:  Positive for visual disturbance.  Respiratory:  Negative for cough, chest tightness and shortness of breath.   Cardiovascular:  Negative for chest pain and leg swelling.  Gastrointestinal:  Negative for  abdominal pain, nausea and vomiting.  Genitourinary:  Negative for dysuria and hematuria.  Musculoskeletal:  Negative for arthralgias and myalgias.  Skin:  Negative for rash.  Neurological:  Positive for dizziness, light-headedness and headaches. Negative for weakness and numbness.   all other systems are negative except as noted in the HPI and PMH.   Physical Exam Updated Vital Signs BP (!) 187/114   Pulse 85   Temp 98.4 F (36.9 C) (Oral)   Resp 16   SpO2 99%  Physical Exam Vitals and nursing note reviewed.  Constitutional:      General: She is not in acute distress.    Appearance: She is well-developed.  HENT:     Head: Normocephalic and atraumatic.     Mouth/Throat:     Pharynx: No oropharyngeal exudate.  Eyes:     Conjunctiva/sclera: Conjunctivae normal.     Pupils:  Pupils are equal, round, and reactive to light.  Neck:     Comments: No meningismus. Cardiovascular:     Rate and Rhythm: Normal rate and regular rhythm.     Heart sounds: Normal heart sounds. No murmur heard. Pulmonary:     Effort: Pulmonary effort is normal. No respiratory distress.     Breath sounds: Normal breath sounds.  Abdominal:     Palpations: Abdomen is soft.     Tenderness: There is no abdominal tenderness. There is no guarding or rebound.  Musculoskeletal:        General: No tenderness. Normal range of motion.     Cervical back: Normal range of motion and neck supple.  Skin:    General: Skin is warm.  Neurological:     Mental Status: She is alert and oriented to person, place, and time.     Cranial Nerves: No cranial nerve deficit.     Motor: No abnormal muscle tone.     Coordination: Coordination normal.     Comments: CN 2-12 intact, no ataxia on finger to nose, no nystagmus, 5/5 strength throughout, no pronator drift, Romberg negative, normal gait.   Psychiatric:        Behavior: Behavior normal.    ED Results / Procedures / Treatments   Labs (all labs ordered are listed, but only  abnormal results are displayed) Labs Reviewed  RAPID URINE DRUG SCREEN, HOSP PERFORMED - Abnormal; Notable for the following components:      Result Value   Cocaine POSITIVE (*)    Tetrahydrocannabinol POSITIVE (*)    All other components within normal limits  CBC - Abnormal; Notable for the following components:   RDW 17.1 (*)    All other components within normal limits  COMPREHENSIVE METABOLIC PANEL - Abnormal; Notable for the following components:   Potassium 3.2 (*)    CO2 21 (*)    Glucose, Bld 241 (*)    Creatinine, Ser 1.08 (*)    Calcium 8.7 (*)    Albumin 3.4 (*)    All other components within normal limits  SEDIMENTATION RATE  TROPONIN I (HIGH SENSITIVITY)  TROPONIN I (HIGH SENSITIVITY)    EKG EKG Interpretation  Date/Time:  Tuesday July 11 2021 13:58:00 EDT Ventricular Rate:  91 PR Interval:  196 QRS Duration: 99 QT Interval:  380 QTC Calculation: 468 R Axis:   59 Text Interpretation: Sinus rhythm Right atrial enlargement Probable LVH with secondary repol abnrm Nonspecific T wave abnormality Confirmed by Glynn Octave [phone]) on 07/11/2021 2:06:00 PM  Radiology CT Head Wo Contrast  Result Date: 07/11/2021 CLINICAL DATA:  Head trauma, GCS=15, severe headache EXAM: CT HEAD WITHOUT CONTRAST TECHNIQUE: Contiguous axial images were obtained from the base of the skull through the vertex without intravenous contrast. RADIATION DOSE REDUCTION: This exam was performed according to the departmental dose-optimization program which includes automated exposure control, adjustment of the mA and/or kV according to patient size and/or use of iterative reconstruction technique. COMPARISON:  None Available. FINDINGS: Brain: There is no acute intracranial hemorrhage or mass effect. Gray-white differentiation is preserved. Suspect focal hypoattenuation along the splenium of the corpus callosum extending primarily into the left pericallosal white matter. There is no extra-axial fluid  collection. Ventricles and sulci are within normal limits in size and configuration. Vascular: There is atherosclerotic calcification at the skull base. Skull: Calvarium is unremarkable. Sinuses/Orbits: No acute finding. Other: None. IMPRESSION: Age-indeterminate low-density along the splenium of the corpus callosum extending into the adjacent white matter.  This may reflect gliosis with no expansion or mass effect to suggest a lesion, but MRI is recommended for further evaluation. Electronically Signed   By: Guadlupe Spanish M.D.   On: 07/11/2021 11:17   MR BRAIN WO CONTRAST  Result Date: 07/11/2021 CLINICAL DATA:  Blurred vision and headache.  Hypertension EXAM: MRI HEAD WITHOUT CONTRAST TECHNIQUE: Multiplanar, multiecho pulse sequences of the brain and surrounding structures were obtained without intravenous contrast. COMPARISON:  No prior MRI, correlation is made with CT head 07/11/2021 FINDINGS: Evaluation is somewhat limited by motion artifact. Brain: Focus of restricted diffusion in the left basal ganglia (series 5, image 78) with ADC correlate (series 6, image 30), consistent with acute or subacute infarct, which measures up to 6 mm in greatest dimension. This may involve the anterior limb of the internal capsule. This is associated with increased T2 signal. No acute hemorrhage, mass, mass effect, or midline shift. No hydrocephalus or extra-axial collection. Scattered T2 hyperintense signal in the periventricular white matter, likely the sequela of chronic small vessel ischemic disease. Vascular: Loss of the right ICA flow void (series 8, image 7-9). Otherwise normal arterial flow voids. Skull and upper cervical spine: Limited evaluation due to motion. Sinuses/Orbits: No acute finding. Other: The mastoids are well aerated. IMPRESSION: 1. Evaluation is somewhat limited by motion artifact. Within this limitation, there is an acute or subacute infarct in the left basal ganglia, which may involve the anterior  limb of the internal capsule. 2. Loss of the right ICA flow void, which could indicate poor flow or occlusion. A CTA of the head and neck is recommended for further evaluation. These results were called by telephone at the time of interpretation on 07/11/2021 at 3:20 pm to provider [facility] , who verbally acknowledged these results. Electronically Signed   By: Wiliam Ke M.D.   On: 07/11/2021 15:20    Procedures Procedures    Medications Ordered in ED Medications  amLODipine (NORVASC) tablet 10 mg (has no administration in time range)  losartan (COZAAR) tablet 50 mg (has no administration in time range)  carvedilol (COREG) tablet 12.5 mg (has no administration in time range)    ED Course/ Medical Decision Making/ A&P                           Medical Decision Making Amount and/or Complexity of Data Reviewed Labs: ordered. Decision-making details documented in ED Course. Radiology: ordered and independent interpretation performed. Decision-making details documented in ED Course. ECG/medicine tests: ordered and independent interpretation performed. Decision-making details documented in ED Course.  Risk Prescription drug management. Decision regarding hospitalization.  Elevated blood pressure with gradual onset headache and blurry vision.  Denies thunderclap onset.  States her vision is now normal.  Low suspicion for subarachnoid hemorrhage, meningitis, temporal arteritis.  Does have elevated blood pressure on arrival.  Nonfocal neurological exam with low suspicion for CVA or TIA.  However CT scan done in triage is abnormal, results reviewed and interpreted by me.  There is a low-density lesion along the corpus callosum. UDS is positive for cocaine.  Home blood pressure medications given.  Labs are reassuring and appear to be at baseline.  EKG does shows T wave inversions laterally and evidence of LVH. UDS is positive for cocaine.   MRI confirms acute versus subacute left basal  ganglia infarct as well as no flow seen in right carotid artery.  Discussed with Dr. Cyndy Freeze of radiology. CTA recommended.   Discussed  with patient.  Will need admission to the hospital for stroke work-up.  Discussed with Doctor David Stall. Discussed with Dr. Amada Jupiter of neurology who agrees with transfer to [facility], CTA is planned as well as blood pressure goal 220/120        Final Clinical Impression(s) / ED Diagnoses Final diagnoses:  Cerebrovascular accident (CVA), unspecified mechanism (HCC)  Hypertensive urgency    Rx / DC Orders ED Discharge Orders     None         Glynn Octave, MD 07/11/21 1622

## 2021-07-11 NOTE — ED Provider Triage Note (Signed)
Emergency Medicine Provider Triage Evaluation Note  ABEEHA TWIST , a 51 y.o. female  was evaluated in triage.  Pt complains of elevated BP. Report having gradual headache, blurry vision since yesterday morning.  Not compliant with her BP medication. No fever, cp, sob, focal numbness/weakness, or confusion  Review of Systems  Positive: As above Negative: As above  Physical Exam  BP (!) 187/114   Pulse 85   Temp 98.4 F (36.9 C) (Oral)   Resp 16   SpO2 99%  Gen:   Awake, no distress   Resp:  Normal effort  MSK:   Moves extremities without difficulty  Other:    Medical Decision Making  Medically screening exam initiated at 10:51 AM.  Appropriate orders placed.  Zania Kalisz Galiano was informed that the remainder of the evaluation will be completed by another provider, this initial triage assessment does not replace that evaluation, and the importance of remaining in the ED until their evaluation is complete.     Fayrene Helper, PA-C 07/11/21 1052

## 2021-07-12 ENCOUNTER — Inpatient Hospital Stay (HOSPITAL_COMMUNITY): Payer: Self-pay

## 2021-07-12 DIAGNOSIS — R0609 Other forms of dyspnea: Secondary | ICD-10-CM

## 2021-07-12 DIAGNOSIS — I639 Cerebral infarction, unspecified: Secondary | ICD-10-CM

## 2021-07-12 LAB — ECHOCARDIOGRAM COMPLETE
AR max vel: 2.17 cm2
AV Area VTI: 2.07 cm2
AV Area mean vel: 2.1 cm2
AV Mean grad: 5 mmHg
AV Peak grad: 8.4 mmHg
Ao pk vel: 1.45 m/s
Area-P 1/2: 9.25 cm2
Calc EF: 47.2 %
Height: 59 in
S' Lateral: 2.6 cm
Single Plane A2C EF: 47.7 %
Single Plane A4C EF: 50.7 %
Weight: 2112.89 oz

## 2021-07-12 LAB — LIPID PANEL
Cholesterol: 154 mg/dL (ref 0–200)
HDL: 37 mg/dL — ABNORMAL LOW (ref >40)
LDL Cholesterol: 72 mg/dL (ref 0–99)
Total CHOL/HDL Ratio: 4.2 RATIO
Triglycerides: 223 mg/dL — ABNORMAL HIGH (ref <150)
VLDL: 45 mg/dL — ABNORMAL HIGH (ref 0–40)

## 2021-07-12 LAB — GLUCOSE, CAPILLARY
Glucose-Capillary: 199 mg/dL — ABNORMAL HIGH (ref 70–99)
Glucose-Capillary: 191 mg/dL — ABNORMAL HIGH (ref 70–99)
Glucose-Capillary: 290 mg/dL — ABNORMAL HIGH (ref 70–99)
Glucose-Capillary: 292 mg/dL — ABNORMAL HIGH (ref 70–99)

## 2021-07-12 LAB — CBC
HCT: 35.2 % — ABNORMAL LOW (ref 36.0–46.0)
Hemoglobin: 12.2 g/dL (ref 12.0–15.0)
MCH: 29.1 pg (ref 26.0–34.0)
MCHC: 34.7 g/dL (ref 30.0–36.0)
MCV: 84 fL (ref 80.0–100.0)
Platelets: 209 10*3/uL (ref 150–400)
RBC: 4.19 MIL/uL (ref 3.87–5.11)
RDW: 16.5 % — ABNORMAL HIGH (ref 11.5–15.5)
WBC: 5.4 10*3/uL (ref 4.0–10.5)
nRBC: 0 % (ref 0.0–0.2)

## 2021-07-12 LAB — HEMOGLOBIN A1C
Hgb A1c MFr Bld: 7.9 % — ABNORMAL HIGH (ref 4.8–5.6)
Mean Plasma Glucose: 180.03 mg/dL

## 2021-07-12 LAB — CREATININE, SERUM
Creatinine, Ser: 0.93 mg/dL (ref 0.44–1.00)
GFR, Estimated: >60 mL/min (ref >60)

## 2021-07-12 LAB — HIV ANTIBODY (ROUTINE TESTING W REFLEX): HIV Screen 4th Generation wRfx: NONREACTIVE

## 2021-07-12 MED ORDER — AMLODIPINE BESYLATE 10 MG PO TABS
10.0000 mg | ORAL_TABLET | Freq: Every day | ORAL | Status: DC
Start: 2021-07-12 — End: 2021-07-13
  Administered 2021-07-12 – 2021-07-13 (×2): 10 mg via ORAL
  Filled 2021-07-12 (×2): qty 1

## 2021-07-12 MED ORDER — IOHEXOL 350 MG/ML SOLN
100.0000 mL | Freq: Once | INTRAVENOUS | Status: AC | PRN
  Administered 2021-07-12: 75 mL via INTRAVENOUS

## 2021-07-12 MED ORDER — GADOBUTROL 1 MMOL/ML IV SOLN
5.0000 mL | Freq: Once | INTRAVENOUS | Status: AC | PRN
  Administered 2021-07-12: 5 mL via INTRAVENOUS

## 2021-07-12 MED ORDER — LOSARTAN POTASSIUM 50 MG PO TABS
50.0000 mg | ORAL_TABLET | Freq: Every day | ORAL | Status: DC
  Administered 2021-07-12: 50 mg via ORAL
  Filled 2021-07-12: qty 1

## 2021-07-12 NOTE — Hospital Course (Signed)
Stephanie [name] is a 51 y.o. female with PMH DMII, HTN, noncompliance, ongoing substance use who presented with blurry vision.  She endorsed ongoing use of cocaine prior to admission.  She had developed blurry vision a couple days prior to admission. MRI brain showed acute/subacute infarct involving the left basal ganglia. 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 She was also followed by neurology.  She was started on aspirin and Plavix.

## 2021-07-12 NOTE — Plan of Care (Signed)
  Problem: Education: Goal: Ability to describe self-care measures that may prevent or decrease complications (Diabetes Survival Skills Education) will improve Outcome: Progressing Goal: Individualized Educational Video(s) Outcome: Progressing   Problem: Coping: Goal: Ability to adjust to condition or change in health will improve Outcome: Progressing   Problem: Fluid Volume: Goal: Ability to maintain a balanced intake and output will improve Outcome: Progressing   Problem: Health Behavior/Discharge Planning: Goal: Ability to identify and utilize available resources and services will improve Outcome: Progressing Goal: Ability to manage health-related needs will improve Outcome: Progressing   Problem: Metabolic: Goal: Ability to maintain appropriate glucose levels will improve Outcome: Progressing   Problem: Nutritional: Goal: Maintenance of adequate nutrition will improve Outcome: Progressing Goal: Progress toward achieving an optimal weight will improve Outcome: Progressing   Problem: Skin Integrity: Goal: Risk for impaired skin integrity will decrease Outcome: Progressing   Problem: Tissue Perfusion: Goal: Adequacy of tissue perfusion will improve Outcome: Progressing   Problem: Education: Goal: Knowledge of secondary prevention will improve (SELECT ALL) Outcome: Progressing Goal: Knowledge of patient specific risk factors will improve (INDIVIDUALIZE FOR PATIENT) Outcome: Progressing   Problem: Coping: Goal: Will identify appropriate support needs Outcome: Progressing   Problem: Health Behavior/Discharge Planning: Goal: Ability to manage health-related needs will improve Outcome: Progressing   Problem: Ischemic Stroke/TIA Tissue Perfusion: Goal: Complications of ischemic stroke/TIA will be minimized Outcome: Progressing   Problem: Health Behavior/Discharge Planning: Goal: Ability to manage health-related needs will improve Outcome: Progressing   Problem:  Clinical Measurements: Goal: Will remain free from infection Outcome: Progressing Goal: Respiratory complications will improve Outcome: Progressing   Problem: Pain Managment: Goal: General experience of comfort will improve Outcome: Progressing   Problem: Safety: Goal: Ability to remain free from injury will improve Outcome: Progressing

## 2021-07-12 NOTE — Evaluation (Addendum)
Occupational Therapy Evaluation Patient Details Name: Stephanie [name] MRN: [id number] DOB: 01-Oct-1970 Today's Date: 07/12/2021   History of Present Illness Pt is a 51 y/o female presenting on 6/6 with blurred vision. 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 PMH includes: DM2, HTN with noncompliance of medication, ongoing tobacco/cocaine abuse.   Clinical Impression   PTA patient independent and working at [employer] full time, uses city transportation system. Admitted for above and limited by problem list below, including impaired safety awareness and attention, impulsivity, blurry vision, impaired balance. She completes ADLs with up to min guard assist, transfers with min guard assist.  She requires cueing to redirect to task and for safety.  Limited session due to elevated BP- see below, and transport arriving for MRI.  Will follow acutely with recommendations for OP OT level pending progress.   BP supine 189/110 BP EOB 201/146 BP supine 195/107      Recommendations for follow up therapy are one component of a multi-disciplinary discharge planning process, led by the attending physician.  Recommendations may be updated based on patient status, additional functional criteria and insurance authorization.   Follow Up Recommendations  Outpatient OT (vs no OT follow up, pending progress)    Assistance Recommended at Discharge Intermittent Supervision/Assistance  Patient can return home with the following A little help with walking and/or transfers;A little help with bathing/dressing/bathroom;Direct supervision/assist for medications management;Direct supervision/assist for financial management;Assist for transportation;Help with stairs or ramp for entrance;Assistance with cooking/housework    Functional Status Assessment  Patient has had a recent decline in their functional status and demonstrates the ability to make significant improvements in function in a  reasonable and predictable amount of time.  Equipment Recommendations  Tub/shower seat    Recommendations for Other Services PT consult     Precautions / Restrictions Precautions Precautions: Fall Restrictions Weight Bearing Restrictions: No      Mobility Bed Mobility Overal bed mobility: Modified Independent                  Transfers Overall transfer level: Needs assistance Equipment used: None Transfers: Sit to/from Stand Sit to Stand: Min guard           General transfer comment: for safety      Balance Overall balance assessment: Mild deficits observed, not formally tested                                         ADL either performed or assessed with clinical judgement   ADL Overall ADL's : Needs assistance/impaired     Grooming: Set up;Sitting           Upper Body Dressing : Set up;Sitting   Lower Body Dressing: Min guard;Sit to/from stand   Toilet Transfer: Hydrographic surveyor Details (indicate cue type and reason): simulated side stepping to [facility]         Functional mobility during ADLs: Min guard;Cueing for safety       Vision Baseline Vision/History: 1 Wears glasses (readers) Ability to See in Adequate Light: 0 Adequate Patient Visual Report: Blurring of vision Vision Assessment?: Yes Eye Alignment: Within Functional Limits Ocular Range of Motion: Within Functional Limits Alignment/Gaze Preference: Within Defined Limits Tracking/Visual Pursuits: Able to track stimulus in all quads without difficulty Saccades: Within functional limits Convergence: Within functional limits Visual Fields: No apparent deficits Additional Comments:  reports blurry vision comes and goes, able to read at distance large letteres but not smaller letters on  board, reports no difficulty with cell phone     Perception     Praxis      Pertinent Vitals/Pain Pain Assessment Pain Assessment: Faces Faces Pain Scale:  Hurts a little bit Pain Location: HA Pain Descriptors / Indicators: Discomfort, Headache Pain Intervention(s): Monitored during session     Hand Dominance Right   Extremity/Trunk Assessment Upper Extremity Assessment Upper Extremity Assessment: Overall WFL for tasks assessed   Lower Extremity Assessment Lower Extremity Assessment: Defer to PT evaluation       Communication Communication Communication: No difficulties   Cognition Arousal/Alertness: Awake/alert Behavior During Therapy: Impulsive Overall Cognitive Status: Impaired/Different from baseline Area of Impairment: Safety/judgement, Attention, Problem solving                   Current Attention Level: Sustained     Safety/Judgement: Decreased awareness of safety   Problem Solving: Requires verbal cues General Comments: pt requires redirection in quiet non distracting enviorment, poor awareness of safety     General Comments  BP elevated see clinical impression, limited session due to transport arriving for MRI    Exercises     Shoulder Instructions      Home Living Family/patient expects to be discharged to:: Private residence Energy manager) Living Arrangements: Alone Available Help at Discharge: Family;Friend(s) Type of Home: Other(Comment) (motel) Home Access: Level entry     Home Layout: One level     Bathroom Shower/Tub: Chief Strategy Officer: Standard     Home Equipment: Grab bars - toilet;Grab bars - tub/shower          Prior Functioning/Environment Prior Level of Function : Independent/Modified Independent;Working/employed               ADLs Comments: works at [employer], rides city bus        OT Problem List: Decreased strength;Decreased activity tolerance;Impaired balance (sitting and/or standing);Decreased cognition;Decreased safety awareness      OT Treatment/Interventions: Self-care/ADL training;DME and/or AE instruction;Therapeutic activities;Balance  training;Patient/family education;Cognitive remediation/compensation    OT Goals(Current goals can be found in the care plan section) Acute Rehab OT Goals Patient Stated Goal: get back to work OT Goal Formulation: With patient Time For Goal Achievement: 07/26/21 Potential to Achieve Goals: Good  OT Frequency: Min 2X/week    Co-evaluation              AM-PAC OT "6 Clicks" Daily Activity     Outcome Measure Help from another person eating meals?: None Help from another person taking care of personal grooming?: A Little Help from another person toileting, which includes using toliet, bedpan, or urinal?: A Little Help from another person bathing (including washing, rinsing, drying)?: A Little Help from another person to put on and taking off regular upper body clothing?: A Little Help from another person to put on and taking off regular lower body clothing?: A Little 6 Click Score: 19   End of Session Nurse Communication: Mobility status  Activity Tolerance: Patient tolerated treatment well Patient left: in bed;with call bell/phone within reach;Other (comment) (transport with pt)  OT Visit Diagnosis: Unsteadiness on feet (R26.81);Other symptoms and signs involving cognitive function                Time: 1610-9604 OT Time Calculation (min): 16 min Charges:  OT General Charges $OT Visit: 1 Visit OT Evaluation $OT Eval Moderate Complexity: 1 Mod  Barry Brunnerhristie B, OT Acute Rehabilitation Services Office [phone]   Chancy MilroyChristie S Talley Casco 07/12/2021, 9:50 AM

## 2021-07-12 NOTE — Progress Notes (Signed)
OT Cancellation Note  Patient Details Name: Stephanie [name] MRN: [id number] DOB: 18-Apr-1970   Cancelled Treatment:    Reason Eval/Treat Not Completed: Patient at procedure or test/ unavailable- echo in room.  Will see as able.   Barry Brunner, OT Acute Rehabilitation Services Office [phone]   Chancy Milroy 07/12/2021, 8:38 AM

## 2021-07-12 NOTE — Progress Notes (Signed)
TCD bubble study completed with Dr. Leonie Man. Refer to "CV Proc" under chart review to view preliminary results.  07/12/2021 12:18 PM Kelby Aline., MHA, RVT, RDCS, RDMS

## 2021-07-12 NOTE — Evaluation (Signed)
Physical Therapy Evaluation Patient Details Name: Stephanie [name] MRN: [id number] DOB: [birth year] Today's Date: 07/12/2021  History of Present Illness  Pt is a 51 y/o female presenting on 6/6 with blurred vision. 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 PMH includes: DM2, HTN with noncompliance of medication, ongoing tobacco/cocaine abuse.  Clinical Impression  Patient admitted with the above. PTA, patient lives alone in a motel and working full time at [employer]. Patient is impulsive but with increased time spent with her this seems to be her baseline. Patient ambulating at supervision level with no AD but does demonstrate balance deficits with head turns and obstacle negotiation. BP running high during session 180s/120s. Patient will benefit from skilled PT services during acute stay to address listed deficits. No PT follow up recommended at this time.      Recommendations for follow up therapy are one component of a multi-disciplinary discharge planning process, led by the attending physician.  Recommendations may be updated based on patient status, additional functional criteria and insurance authorization.  Follow Up Recommendations No PT follow up    Assistance Recommended at Discharge PRN  Patient can return home with the following       Equipment Recommendations None recommended by PT  Recommendations for Other Services       Functional Status Assessment Patient has had a recent decline in their functional status and demonstrates the ability to make significant improvements in function in a reasonable and predictable amount of time.     Precautions / Restrictions Precautions Precautions: Fall Restrictions Weight Bearing Restrictions: No      Mobility  Bed Mobility Overal bed mobility: Modified Independent                  Transfers Overall transfer level: Needs assistance Equipment used: None Transfers: Sit to/from  Stand Sit to Stand: Supervision           General transfer comment: supervision for safety. Impulsive    Ambulation/Gait Ambulation/Gait assistance: Supervision Gait Distance (Feet): 250 Feet Assistive device: None Gait Pattern/deviations: Step-through pattern, Decreased stride length, Drifts right/left Gait velocity: decreased     General Gait Details: supervision for safety. Patient drifting L/R with head turns and minor unsteadiness with stepping over objects.  Stairs            Wheelchair Mobility    Modified Rankin (Stroke Patients Only)       Balance Overall balance assessment: Mild deficits observed, not formally tested                                           Pertinent Vitals/Pain Pain Assessment Pain Assessment: No/denies pain    Home Living Family/patient expects to be discharged to:: Private residence Energy manager) Living Arrangements: Alone Available Help at Discharge: Family;Friend(s) Type of Home: Other(Comment) (motel) Home Access: Level entry       Home Layout: One level Home Equipment: Grab bars - toilet;Grab bars - tub/shower      Prior Function Prior Level of Function : Independent/Modified Independent;Working/employed                     Hand Dominance   Dominant Hand: Right    Extremity/Trunk Assessment   Upper Extremity Assessment Upper Extremity Assessment: Defer to OT evaluation    Lower Extremity Assessment Lower Extremity Assessment: Generalized weakness  Cervical / Trunk Assessment Cervical / Trunk Assessment: Normal  Communication   Communication: No difficulties  Cognition Arousal/Alertness: Awake/alert Behavior During Therapy: Impulsive Overall Cognitive Status: Within Functional Limits for tasks assessed                                 General Comments: impulsive during session but with increased time spent with her, it seems to be baseline        General Comments  General comments (skin integrity, edema, etc.): BP elevated during session 180s/120s (patient reports this is normal for her)    Exercises     Assessment/Plan    PT Assessment Patient needs continued PT services  PT Problem List Decreased strength;Decreased activity tolerance;Decreased balance;Decreased mobility;Decreased safety awareness       PT Treatment Interventions DME instruction;Gait training;Therapeutic activities;Therapeutic exercise;Functional mobility training;Balance training;Patient/family education    PT Goals (Current goals can be found in the Care Plan section)  Acute Rehab PT Goals Patient Stated Goal: to go home PT Goal Formulation: With patient Time For Goal Achievement: 07/19/21 Potential to Achieve Goals: Good Additional Goals Additional Goal #1: Patient will score >19/24 to reduce fall risk    Frequency Min 3X/week     Co-evaluation               AM-PAC PT "6 Clicks" Mobility  Outcome Measure Help needed turning from your back to your side while in a flat bed without using bedrails?: None Help needed moving from lying on your back to sitting on the side of a flat bed without using bedrails?: None Help needed moving to and from a bed to a chair (including a wheelchair)?: None Help needed standing up from a chair using your arms (e.g., wheelchair or bedside chair)?: None Help needed to walk in hospital room?: A Little Help needed climbing 3-5 steps with a railing? : A Little 6 Click Score: 22    End of Session   Activity Tolerance: Patient tolerated treatment well Patient left: in bed;with call bell/phone within reach;Other (comment) (handoff to SLP) Nurse Communication: Mobility status PT Visit Diagnosis: Unsteadiness on feet (R26.81);Muscle weakness (generalized) (M62.81)    Time: 4128-7867 PT Time Calculation (min) (ACUTE ONLY): 16 min   Charges:   PT Evaluation $PT Eval Low Complexity: 1 Low          Daliya Parchment A. Dan Humphreys PT,  DPT Acute Rehabilitation Services Office [phone]   Viviann Spare 07/12/2021, 2:24 PM

## 2021-07-12 NOTE — TOC Initial Note (Addendum)
Transition of Care [name]) - Initial/Assessment Note    Patient Details  Name: Stephanie [name] MRN: [id number] Date of Birth: [birth year]  Transition of Care [name]) CM/SW Contact:    Stephanie Balo, RN Phone Number: 07/12/2021, 11:34 AM  Clinical Narrative:                 Patient lives in a motel alone. She states her childrens father checks in on her.  Pt is without insurance. She was previously going to [facility] at [name] and would like to get back into the clinic. CM has attempted to reach [name]. Will continue to try and get her an appointment.  Pt has used [name] in the past. Cm has provided the new address for her to f/u with after d/c.  TOC following.  1442: CM has called [facility] Care at [name] 4 times without an answer. CM will keep trying or get her an appointment at another [name].  CM provided her resources for rent assistance per her request.  Expected Discharge Plan: Home/Self Care Barriers to Discharge: Continued Medical Work up, Inadequate or no insurance   Patient Goals and CMS Choice        Expected Discharge Plan and Services Expected Discharge Plan: Home/Self Care   Discharge Planning Services: CM Consult   Living arrangements for the past 2 months: Hotel/Motel                                      Prior Living Arrangements/Services Living arrangements for the past 2 months: Hotel/Motel Lives with:: Self Patient language and need for interpreter reviewed:: Yes Do you feel safe going back to the place where you live?: Yes        Care giver support [name] in place?: No (comment)   Criminal Activity/Legal Involvement Pertinent to Current Situation/Hospitalization: No - Comment as needed  Activities of Daily Living Home Assistive Devices/Equipment: Eyeglasses (reading glasses) ADL Screening (condition at time of admission) Patient's cognitive ability adequate to safely complete daily activities?: Yes Is the  patient deaf or have difficulty hearing?: No Does the patient have difficulty seeing, even when wearing glasses/contacts?: No Does the patient have difficulty concentrating, remembering, or making decisions?: No Patient able to express need for assistance with ADLs?: Yes Does the patient have difficulty dressing or bathing?: No Independently performs ADLs?: Yes (appropriate for developmental age) Does the patient have difficulty walking or climbing stairs?: No Weakness of Legs: None Weakness of Arms/Hands: Right (right hand gets numb and is a new problem)  Permission Sought/Granted                  Emotional Assessment Appearance:: Appears stated age Attitude/Demeanor/Rapport: Engaged Affect (typically observed): Accepting Orientation: : Oriented to Self, Oriented to Place, Oriented to  Time, Oriented to Situation   Psych Involvement: No (comment)  Admission diagnosis:  Hypertensive urgency [I16.0] Acute CVA (cerebrovascular accident) [name]) [I63.9] Cerebrovascular accident (CVA), unspecified mechanism (HCC) [I63.9] Patient Active Problem List   Diagnosis Date Noted   Acute CVA (cerebrovascular accident) (HCC) 07/11/2021   Polysubstance abuse (HCC) 07/11/2021   High cholesterol 11/21/2020   Mixed hyperlipidemia 01/05/2020   Elevated liver enzymes 01/05/2020   Asymptomatic hypertensive urgency 01/05/2020   Diabetes mellitus (HCC) 01/05/2020   Type 2 diabetes mellitus with diabetic neuropathy (HCC) 08/14/2019   Abnormal EKG 04/13/2018   Elevated blood sugar 12/31/2017  Essential hypertension 12/31/2017   PCP:  Pcp, No Pharmacy:   [name] [address] [name] [address] Phone: [phone] Fax: [phone]  [name] [address] [place]), [place] - [address] [name]) [place] [address] Phone: [phone] Fax: [phone]     Social Determinants of Health  (SDOH) Interventions    Readmission Risk Interventions     View : No data to display.

## 2021-07-12 NOTE — Progress Notes (Signed)
Progress Note    Stephanie [name]   ELF:[id number]  DOB: 28-Jul-1970  DOA: 07/11/2021     1 PCP: Pcp, No  Initial CC: Blurry vision  Hospital Course: Stephanie [name] is a 51 y.o. female with PMH DMII, HTN, noncompliance, ongoing substance use who presented with blurry vision.  She endorsed ongoing use of cocaine prior to admission.  She had developed blurry vision a couple days prior to admission. MRI brain showed acute/subacute infarct involving the left basal ganglia. 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 She was also followed by neurology.  She was started on aspirin and Plavix.  Interval History:  Family present bedside when seen this morning.  Still having some blurry vision but has improved some since admission.  We discussed findings of her multiple imaging studies and strong recommendation for total cessation from any further substance use.  She plans to continue cessation at discharge and following up with primary care outpatient for ongoing blood pressure control.  Assessment and Plan:  Acute CVA (cerebrovascular accident) (HCC) -A1c 7.9%; LDL 72 -Permissive hypertension allowed on admission.  Okay to start bringing pressure down slowly -Continue aspirin and Plavix for 3 weeks followed by aspirin alone per neurology recommendations -No needs from PT/OT  Essential hypertension - s/p permissive hypertension - Continue amlodipine, Coreg, losartan   Type 2 diabetes mellitus with diabetic neuropathy (HCC) -A1c 7.9% - Continue SSI and Levemir   High cholesterol Continue Lipitor   Polysubstance abuse: UDS is positive for cocaine she has been counseled   Old records reviewed in assessment of this patient  Antimicrobials:   DVT prophylaxis:  enoxaparin (LOVENOX) injection 40 mg Start: 07/11/21 2115   Code Status:   Code Status: Full Code  Disposition  Plan:  Home Thus Status is: Inpt  Objective: Blood pressure (!) 172/100, pulse 85, temperature 98.7 F (37.1 C), temperature source Oral, resp. rate 18, height 4\' 11"  (1.499 m), weight 59.9 kg, SpO2 99 %.  Examination:  Physical Exam Constitutional:      Appearance: Normal appearance.  HENT:     Head: Normocephalic and atraumatic.     Mouth/Throat:     Mouth: Mucous membranes are moist.  Eyes:     Extraocular Movements: Extraocular movements intact.     Comments: Gross visual acuity intact  Cardiovascular:     Rate and Rhythm: Normal rate and regular rhythm.  Pulmonary:     Effort: Pulmonary effort is normal. No respiratory distress.     Breath sounds: Normal breath sounds. No wheezing.  Abdominal:     General: Bowel sounds are normal. There is no distension.     Palpations: Abdomen is soft.     Tenderness: There is no abdominal tenderness.  Musculoskeletal:        General: Normal range of motion.     Cervical back: Normal range of motion and neck supple.  Skin:    General: Skin is warm.  Neurological:     General: No focal deficit present.     Mental Status: She is alert and oriented to person, place, and time.  Psychiatric:        Mood and Affect: Mood normal.        Behavior: Behavior normal.     Consultants:  Neurology  Procedures:    Data Reviewed: Results for orders placed or performed during the hospital encounter of 07/11/21 (from the past  24 hour(s))  Glucose, capillary     Status: Abnormal   Collection Time: 07/11/21  9:01 PM  Result Value Ref Range   Glucose-Capillary 288 (H) 70 - 99 mg/dL  HIV Antibody (routine testing w rflx)     Status: None   Collection Time: 07/12/21  3:36 AM  Result Value Ref Range   HIV Screen 4th Generation wRfx Non Reactive Non Reactive  Lipid panel     Status: Abnormal   Collection Time: 07/12/21  3:36 AM  Result Value Ref Range   Cholesterol 154 0 - 200 mg/dL   Triglycerides 601 (H) <150 mg/dL   HDL 37 (L) >09 mg/dL    Total CHOL/HDL Ratio 4.2 RATIO   VLDL 45 (H) 0 - 40 mg/dL   LDL Cholesterol 72 0 - 99 mg/dL  Hemoglobin N2T     Status: Abnormal   Collection Time: 07/12/21  3:36 AM  Result Value Ref Range   Hgb A1c MFr Bld 7.9 (H) 4.8 - 5.6 %   Mean Plasma Glucose 180.03 mg/dL  CBC     Status: Abnormal   Collection Time: 07/12/21  3:36 AM  Result Value Ref Range   WBC 5.4 4.0 - 10.5 K/uL   RBC 4.19 3.87 - 5.11 MIL/uL   Hemoglobin 12.2 12.0 - 15.0 g/dL   HCT 55.7 (L) 32.2 - 02.5 %   MCV 84.0 80.0 - 100.0 fL   MCH 29.1 26.0 - 34.0 pg   MCHC 34.7 30.0 - 36.0 g/dL   RDW 42.7 (H) 06.2 - 37.6 %   Platelets 209 150 - 400 K/uL   nRBC 0.0 0.0 - 0.2 %  Creatinine, serum     Status: None   Collection Time: 07/12/21  3:36 AM  Result Value Ref Range   Creatinine, Ser 0.93 0.44 - 1.00 mg/dL   GFR, Estimated >28 >31 mL/min  Glucose, capillary     Status: Abnormal   Collection Time: 07/12/21  6:11 AM  Result Value Ref Range   Glucose-Capillary 199 (H) 70 - 99 mg/dL  Glucose, capillary     Status: Abnormal   Collection Time: 07/12/21 11:09 AM  Result Value Ref Range   Glucose-Capillary 191 (H) 70 - 99 mg/dL  Glucose, capillary     Status: Abnormal   Collection Time: 07/12/21  4:30 PM  Result Value Ref Range   Glucose-Capillary 290 (H) 70 - 99 mg/dL    I have Reviewed nursing notes, Vitals, and Lab results since pt's last encounter. Pertinent lab results : see above I have ordered test including BMP, CBC, Mg I have reviewed the last note from staff over past 24 hours I have discussed pt's care plan and test results with nursing staff, case manager   LOS: 1 day   Lewie Chamber, MD [facility] 07/12/2021, 5:48 PM

## 2021-07-12 NOTE — Progress Notes (Signed)
Echocardiogram 2D Echocardiogram has been performed.  [name] 07/12/2021, 8:55 AM

## 2021-07-12 NOTE — Plan of Care (Signed)
  Problem: Education: Goal: Ability to describe self-care measures that may prevent or decrease complications (Diabetes Survival Skills Education) will improve 07/12/2021 2258 by Laurance Flatten, RN Outcome: Progressing 07/12/2021 2258 by Laurance Flatten, RN Outcome: Progressing Goal: Individualized Educational Video(s) 07/12/2021 2258 by Laurance Flatten, RN Outcome: Progressing 07/12/2021 2258 by Laurance Flatten, RN Outcome: Progressing   Problem: Coping: Goal: Ability to adjust to condition or change in health will improve 07/12/2021 2258 by Laurance Flatten, RN Outcome: Progressing 07/12/2021 2258 by Laurance Flatten, RN Outcome: Progressing

## 2021-07-12 NOTE — Evaluation (Signed)
Speech Language Pathology Evaluation Patient Details Name: Stephanie [name] MRN: [id number] DOB: Apr 04, 1970 Today's Date: 07/12/2021 Time: 7096-2836 SLP Time Calculation (min) (ACUTE ONLY): 14 min  Problem List:  Patient Active Problem List   Diagnosis Date Noted   Acute CVA (cerebrovascular accident) (HCC) 07/11/2021   Polysubstance abuse (HCC) 07/11/2021   High cholesterol 11/21/2020   Mixed hyperlipidemia 01/05/2020   Elevated liver enzymes 01/05/2020   Asymptomatic hypertensive urgency 01/05/2020   Diabetes mellitus (HCC) 01/05/2020   Type 2 diabetes mellitus with diabetic neuropathy (HCC) 08/14/2019   Abnormal EKG 04/13/2018   Elevated blood sugar 12/31/2017   Essential hypertension 12/31/2017   Past Medical History:  Past Medical History:  Diagnosis Date   Diabetes mellitus without complication (HCC)    High cholesterol    Hypertension    Past Surgical History: History reviewed. No pertinent surgical history. HPI:  Pt is a 51 y/o female presenting on 6/6 with blurred vision. 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 PMH includes: DM2, HTN with noncompliance of medication, ongoing tobacco/cocaine abuse.   Assessment / Plan / Recommendation Clinical Impression  During functional activities with OT and PT pt demonstrated impulsivity and decreased safety awareness with OT. Her speech, oral-motor abilities and language are within normal limits and was able to clearly express her needs and thoughts with therapist. Cognitively from a verbal standpoint, she was oriented, demonstrated adequate problem solving ability, accurate clock drawing and time markers, Where she had mild difficulty scoring a 24 out of 30 on the SLUMS (one point away from normal for someone without a high school education). She needed semantic cues for 2/5 word recall, difficulty with retrieval, assist for half mental calculations. Therapist provided verbal education in regards to  recall of important information. Compared to functional more physical activities her cognition, overall appears functional and no further ST is needed at this time.    SLP Assessment  SLP Recommendation/Assessment: Patient does not need any further Speech Lanaguage Pathology Services    Recommendations for follow up therapy are one component of a multi-disciplinary discharge planning process, led by the attending physician.  Recommendations may be updated based on patient status, additional functional criteria and insurance authorization.    Follow Up Recommendations  No SLP follow up    Assistance Recommended at Discharge  None  Functional Status Assessment    Frequency and Duration           SLP Evaluation Cognition  Overall Cognitive Status: Within Functional Limits for tasks assessed Arousal/Alertness: Awake/alert Orientation Level: Oriented X4 Year: 2023 Day of Week: Correct Attention: Sustained Sustained Attention: Appears intact Memory: Impaired Memory Impairment: Retrieval deficit Awareness:  (questionable) Problem Solving: Appears intact (for basic) Behaviors: Impulsive Safety/Judgment:  (needs reinforcement of safety- knew she can't get up on her own)       Comprehension  Auditory Comprehension Overall Auditory Comprehension: Appears within functional limits for tasks assessed Visual Recognition/Discrimination Discrimination: Not tested Reading Comprehension Reading Status: Not tested    Expression Expression Primary Mode of Expression: Verbal Verbal Expression Overall Verbal Expression: Appears within functional limits for tasks assessed Pragmatics: No impairment Written Expression Dominant Hand: Right Written Expression: Exceptions to [facility]   Oral / Motor  Oral Motor/Sensory Function Overall Oral Motor/Sensory Function: Within functional limits Motor Speech Overall Motor Speech: Appears within functional limits for tasks assessed Articulation: Within  functional limitis Intelligibility: Intelligible Motor Planning: Witnin functional limits  Royce Macadamia 07/12/2021, 2:26 PM

## 2021-07-12 NOTE — Progress Notes (Addendum)
STROKE TEAM PROGRESS NOTE   INTERVAL HISTORY No  family at the bedside. CTA shows Right ICA is occluded. TCD bubble study today at 1100. C spine with and wo and MRI brain with contrast. Hypertensive, resume home medications otherwise neurologically stable. Cocaine pos on UDS, pt is will to quit using cocaine and cigarettes.   Vitals:   07/11/21 2041 07/11/21 2325 07/12/21 0411 07/12/21 0747  BP: (!) 175/101 (!) 147/92 (!) 164/95 (!) 191/106  Pulse: 84 88 77 90  Resp: 18 15 14 16   Temp: 98.6 F (37 C) 98.3 F (36.8 C) 98.6 F (37 C) 97.8 F (36.6 C)  TempSrc: Oral Oral Oral Oral  SpO2: 96% 97% 94%   Weight: 59.9 kg     Height: 4\' 11"  (1.499 m)      CBC:  Recent Labs  Lab 07/11/21 1100 07/12/21 0336  WBC 6.2 5.4  HGB 13.1 12.2  HCT 37.8 35.2*  MCV 85.1 84.0  PLT 229 209   Basic Metabolic Panel:  Recent Labs  Lab 07/11/21 1100 07/12/21 0336  NA 139  --   K 3.2*  --   CL 109  --   CO2 21*  --   GLUCOSE 241*  --   BUN 13  --   CREATININE 1.08* 0.93  CALCIUM 8.7*  --    Lipid Panel:  Recent Labs  Lab 07/12/21 0336  CHOL 154  TRIG 223*  HDL 37*  CHOLHDL 4.2  VLDL 45*  LDLCALC 72   HgbA1c:  Recent Labs  Lab 07/12/21 0336  HGBA1C 7.9*   Urine Drug Screen:  Recent Labs  Lab 07/11/21 1130  LABOPIA NONE DETECTED  COCAINSCRNUR POSITIVE*  LABBENZ NONE DETECTED  AMPHETMU NONE DETECTED  THCU POSITIVE*  LABBARB NONE DETECTED    Alcohol Level No results for input(s): ETH in the last 168 hours.  IMAGING past 24 hours CT ANGIO HEAD NECK W WO CM  Result Date: 07/12/2021 CLINICAL DATA:  Stroke follow-up EXAM: CT ANGIOGRAPHY HEAD AND NECK TECHNIQUE: Multidetector CT imaging of the head and neck was performed using the standard protocol during bolus administration of intravenous contrast. Multiplanar CT image reconstructions and MIPs were obtained to evaluate the vascular anatomy. Carotid stenosis measurements (when applicable) are obtained utilizing NASCET  criteria, using the distal internal carotid diameter as the denominator. RADIATION DOSE REDUCTION: This exam was performed according to the departmental dose-optimization program which includes automated exposure control, adjustment of the mA and/or kV according to patient size and/or use of iterative reconstruction technique. CONTRAST:  67mL OMNIPAQUE IOHEXOL 350 MG/ML SOLN COMPARISON:  Brain MRI and head CT 07/11/2021 FINDINGS: CT HEAD FINDINGS Brain: There is no mass, hemorrhage or extra-axial collection. The size and configuration of the ventricles and extra-axial CSF spaces are normal. There is no acute or chronic infarction. The brain parenchyma is normal. Skull: The visualized skull base, calvarium and extracranial soft tissues are normal. Sinuses/Orbits: No fluid levels or advanced mucosal thickening of the visualized paranasal sinuses. No mastoid or middle ear effusion. The orbits are normal. CTA NECK FINDINGS SKELETON: There is no bony spinal canal stenosis. No lytic or blastic lesion. OTHER NECK: Normal pharynx, larynx and major salivary glands. No cervical lymphadenopathy. Unremarkable thyroid gland. UPPER CHEST: No pneumothorax or pleural effusion. No nodules or masses. AORTIC ARCH: There is no calcific atherosclerosis of the aortic arch. There is no aneurysm, dissection or hemodynamically significant stenosis of the visualized portion of the aorta. Conventional 3 vessel aortic branching pattern. The  visualized proximal subclavian arteries are widely patent. RIGHT CAROTID SYSTEM: The right internal carotid artery is occluded at its origin with no reconstitution in the neck. LEFT CAROTID SYSTEM: There is eccentric soft plaque within the distal common carotid artery a causes 50% stenosis. The left ICA is widely patent to the skull base. VERTEBRAL ARTERIES: Left dominant configuration. Both origins are clearly patent. There is no dissection, occlusion or flow-limiting stenosis to the skull base (V1-V3  segments). CTA HEAD FINDINGS POSTERIOR CIRCULATION: --Vertebral arteries: Normal V4 segments. --Inferior cerebellar arteries: Normal. --Basilar artery: Normal. --Superior cerebellar arteries: Normal. --Posterior cerebral arteries (PCA): Normal. ANTERIOR CIRCULATION: --Intracranial internal carotid arteries: Severe narrowing of the right ICA at the skull base. Mild left ICA stenosis due to atherosclerotic plaque calcification --Anterior cerebral arteries (ACA): Normal. Both A1 segments are present. Patent anterior communicating artery (a-comm). --Middle cerebral arteries (MCA): Normal. VENOUS SINUSES: As permitted by contrast timing, patent. ANATOMIC VARIANTS: None Review of the MIP images confirms the above findings. IMPRESSION: 1. 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 2. Severely narrowed skull base and intracranial segments of the right ICA. 3. 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 Electronically Signed   By: Deatra Robinson M.D.   On: 07/12/2021 02:27   CT Head Wo Contrast  Result Date: 07/11/2021 CLINICAL DATA:  Head trauma, GCS=15, severe headache EXAM: CT HEAD WITHOUT CONTRAST TECHNIQUE: Contiguous axial images were obtained from the base of the skull through the vertex without intravenous contrast. RADIATION DOSE REDUCTION: This exam was performed according to the departmental dose-optimization program which includes automated exposure control, adjustment of the mA and/or kV according to patient size and/or use of iterative reconstruction technique. COMPARISON:  None Available. FINDINGS: Brain: There is no acute intracranial hemorrhage or mass effect. Gray-white differentiation is preserved. Suspect focal hypoattenuation along the splenium of the corpus callosum extending primarily into the left pericallosal white matter. There is no extra-axial fluid collection. Ventricles and sulci are within normal limits in size and  configuration. Vascular: There is atherosclerotic calcification at the skull base. Skull: Calvarium is unremarkable. Sinuses/Orbits: No acute finding. Other: None. IMPRESSION: Age-indeterminate low-density along the splenium of the corpus callosum extending into the adjacent white matter. This may reflect gliosis with no expansion or mass effect to suggest a lesion, but MRI is recommended for further evaluation. Electronically Signed   By: Guadlupe Spanish M.D.   On: 07/11/2021 11:17   MR BRAIN WO CONTRAST  Result Date: 07/11/2021 CLINICAL DATA:  Blurred vision and headache.  Hypertension EXAM: MRI HEAD WITHOUT CONTRAST TECHNIQUE: Multiplanar, multiecho pulse sequences of the brain and surrounding structures were obtained without intravenous contrast. COMPARISON:  No prior MRI, correlation is made with CT head 07/11/2021 FINDINGS: Evaluation is somewhat limited by motion artifact. Brain: Focus of restricted diffusion in the left basal ganglia (series 5, image 78) with ADC correlate (series 6, image 30), consistent with acute or subacute infarct, which measures up to 6 mm in greatest dimension. This may involve the anterior limb of the internal capsule. This is associated with increased T2 signal. No acute hemorrhage, mass, mass effect, or midline shift. No hydrocephalus or extra-axial collection. Scattered T2 hyperintense signal in the periventricular white matter, likely the sequela of chronic small vessel ischemic disease. Vascular: Loss of the right ICA flow void (series 8, image 7-9). Otherwise normal arterial flow voids. Skull and upper cervical spine: Limited evaluation due to motion. Sinuses/Orbits: No acute finding.  Other: The mastoids are well aerated. IMPRESSION: 1. Evaluation is somewhat limited by motion artifact. Within this limitation, there is an acute or subacute infarct in the left basal ganglia, which may involve the anterior limb of the internal capsule. 2. Loss of the right ICA flow void, which  could indicate poor flow or occlusion. A CTA of the head and neck is recommended for further evaluation. These results were called by telephone at the time of interpretation on 07/11/2021 at 3:20 pm to provider [facility] , who verbally acknowledged these results. Electronically Signed   By: Wiliam Ke M.D.   On: 07/11/2021 15:20    PHYSICAL EXAM  Physical Exam  Constitutional: Appears well-developed and well-nourished.  Cardiovascular: Normal rate and regular rhythm.  Respiratory: Effort normal, non-labored breathing  Neuro: Mental Status: Patient is awake, alert, oriented to person, place, month, year, and situation. Patient is able to give a clear and coherent history. No signs of aphasia or neglect Cranial Nerves: II: Visual Fields are full. Pupils are equal, round, and reactive to light.  Subjective blurred vision more in the left eye than the right but no objective visual field defect. III,IV, VI: EOMI without ptosis or diploplia. Right eye vision is blurry in comparison to the left.  No afferent pupillary defect V: Facial sensation is symmetric to temperature VII: Facial movement is symmetric resting and smiling VIII: Hearing is intact to voice X: Palate elevates symmetrically XI: Shoulder shrug is symmetric. XII: Tongue protrudes midline without atrophy or fasciculations.  Motor: Tone is normal. Bulk is normal. 5/5 strength was present in all four extremities.  Sensory: Sensation is symmetric to light touch and temperature in the arms and legs. No extinction to DSS present.  Cerebellar: FNF and HKS are intact bilaterally   ASSESSMENT/PLAN [name] is a 51 y.o. female with history of DM, HLD, HTN presenting with blurry vision, headache, and hypertension. MRI was obtained which does demonstrate a subcortical stroke on the left. When testing, her vision out of her right eye is blurry in comparison to the left. MRI with contrast of brain and MRI C-spine with  and w/o contrast ordered to further evaluate white matter changes seen on previous imaging. Plan for TCD bubble study today. Resume home antihypertensive as infarct is subacute and we can start normalizing BP.   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  Subjective blurred vision bilaterally of unclear etiology.   Code Stroke CT head Age-indeterminate low-density along the splenium of the corpus callosum extending into the adjacent white matter. 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 Severely narrowed skull base and intracranial segments of the right ICA. 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 MRI  subacute infarct in the left basal ganglia, which may involve the anterior limb of the internal capsule. Loss of the right ICA flow void, which could indicate poor flow or occlusion.  2D Echo EF 60-65% TCD bubble- pending LDL 72 HgbA1c 7.9 VTE prophylaxis - lovenox    Diet   Diet heart healthy/carb modified Room service appropriate? Yes; Fluid consistency: Thin   No antithrombotic prior to admission, now on aspirin 81 mg daily and clopidogrel 75 mg daily.  Therapy recommendations:  Outpatient OT Disposition:  pending   Hypertension Home meds:  amlodipine, coreg, losartan Unstable- resume home medications Permissive hypertension (OK if < 220/120) but gradually  normalize in 5-7 days Long-term BP goal normotensive  Hyperlipidemia Home meds:  Atorvastatin 10mg , resumed in hospital LDL 72, goal < 70 Increase to 20mg  Continue statin at discharge  Diabetes type II Uncontrolled Home meds:  glipizide, metformin HgbA1c 7.9, goal < 7.0 CBGs Recent Labs    07/11/21 2101 07/12/21 0611  GLUCAP 288* 199*    SSI  Other Stroke Risk Factors Cigarette smoker, advised to stop smoking ETOH use, alcohol level No results found for requested labs within last  26280 hours., advised to drink no more than 1 drink(s) a day Substance abuse - UDS:  THC POSITIVE, Cocaine POSITIVE. Patient advised to stop using due to stroke risk.  Other Active Problems Hypokalemia- replace per primary team   Hospital day # 1  Patient seen and examined by NP/APP with MD. MD to update note as needed.   Elmer Pickerevon Shafer, DNP, FNP-BC [facility] Pager: [phone]  STROKE MD NOTE :  I have personally obtained history,examined this patient, reviewed notes, independently viewed imaging studies, participated in medical decision making and plan of care.ROS completed by me personally and pertinent positives fully documented  I have made any additions or clarifications directly to the above note. Agree with note above.  Patient presents with subjective bilateral blurred vision which appears to be of unclear etiology and MRI shows what appears to be silent subcortical infarct in the setting of cocaine abuse.  Recommend continue ongoing stroke work-up.  Check TCD bubble study for PFO.  ANA panel, anticardiolipin antibodies, 2D echo, continue cardiac monitoring.  Aspirin and Plavix for 3 weeks followed by aspirin alone.  Patient counseled to quit using cocaine and smoking cigarettes and seems agreeable.  Aggressive risk factor modification.  Greater than 50% time during this 50-minute visit was spent in counseling and coordination of care about her having lacunar stroke and subjective blurred vision and answering questions.  Delia HeadyPramod Kemar Pandit, MD Medical Director [facility] Pager: [phone] 07/12/2021 12:24 PM   To contact Stroke Continuity provider, please refer to [email or web address]. After hours, contact General Neurology

## 2021-07-12 NOTE — Progress Notes (Signed)
PT Cancellation Note  Patient Details Name: Stephanie [name] MRN: [id number] DOB: 25-Jan-1971   Cancelled Treatment:    Reason Eval/Treat Not Completed: Patient at procedure or test/unavailable. Attempted pt x2, pt at [facility] and then at MRI. PT to return as able to complete PT eval.  Lewis Shock, PT, DPT Acute Rehabilitation Services Secure chat preferred Office #: [phone]    Iona Hansen 07/12/2021, 10:33 AM

## 2021-07-12 NOTE — Progress Notes (Signed)
PT Cancellation Note  Patient Details Name: Stephanie [name] MRN: [id number] DOB: [birth year]   Cancelled Treatment:    Reason Eval/Treat Not Completed: Patient at procedure or test/unavailable. Pt off floor at TCD bubble test. PT to return as able to complete PT eval.  Lewis Shock, PT, DPT Acute Rehabilitation Services Secure chat preferred Office #: [phone]    Stephanie [name] 07/12/2021, 12:24 PM

## 2021-07-13 ENCOUNTER — Other Ambulatory Visit (HOSPITAL_COMMUNITY): Payer: Self-pay

## 2021-07-13 ENCOUNTER — Encounter: Payer: Self-pay | Admitting: Internal Medicine

## 2021-07-13 DIAGNOSIS — F1910 Other psychoactive substance abuse, uncomplicated: Secondary | ICD-10-CM

## 2021-07-13 LAB — GLUCOSE, CAPILLARY
Glucose-Capillary: 243 mg/dL — ABNORMAL HIGH (ref 70–99)
Glucose-Capillary: 203 mg/dL — ABNORMAL HIGH (ref 70–99)

## 2021-07-13 LAB — ANA W/REFLEX IF POSITIVE: Anti Nuclear Antibody (ANA): NEGATIVE

## 2021-07-13 MED ORDER — ASPIRIN 81 MG PO TBEC: 81.0000 mg | DELAYED_RELEASE_TABLET | Freq: Every day | ORAL | 12 refills | Status: DC

## 2021-07-13 MED ORDER — LOSARTAN POTASSIUM 25 MG PO TABS
75.0000 mg | ORAL_TABLET | Freq: Every day | ORAL | 3 refills | Status: DC
  Filled 2021-07-13: qty 90, 30d supply, fill #0
  Filled 2021-09-25: qty 90, 30d supply, fill #1

## 2021-07-13 MED ORDER — GABAPENTIN 300 MG PO CAPS
300.0000 mg | ORAL_CAPSULE | Freq: Two times a day (BID) | ORAL | 3 refills | Status: DC
  Filled 2021-07-13: qty 60, 30d supply, fill #0
  Filled 2021-09-25: qty 60, 30d supply, fill #1

## 2021-07-13 MED ORDER — CARVEDILOL 12.5 MG PO TABS
12.5000 mg | ORAL_TABLET | Freq: Two times a day (BID) | ORAL | 3 refills | Status: DC
  Filled 2021-07-13: qty 60, 30d supply, fill #0
  Filled 2021-09-25: qty 60, 30d supply, fill #1
  Filled 2022-03-07: qty 60, 30d supply, fill #2

## 2021-07-13 MED ORDER — LOSARTAN POTASSIUM 50 MG PO TABS
75.0000 mg | ORAL_TABLET | Freq: Every day | ORAL | Status: DC
  Administered 2021-07-13: 75 mg via ORAL
  Filled 2021-07-13: qty 1

## 2021-07-13 MED ORDER — ATORVASTATIN CALCIUM 40 MG PO TABS
40.0000 mg | ORAL_TABLET | Freq: Every day | ORAL | 3 refills | Status: DC
  Filled 2021-07-13: qty 30, 30d supply, fill #0
  Filled 2021-09-25: qty 30, 30d supply, fill #1
  Filled 2022-03-07: qty 30, 30d supply, fill #2

## 2021-07-13 MED ORDER — AMLODIPINE BESYLATE 10 MG PO TABS
10.0000 mg | ORAL_TABLET | Freq: Every day | ORAL | 3 refills | Status: DC
  Filled 2021-07-13: qty 30, 30d supply, fill #0
  Filled 2021-09-25: qty 30, 30d supply, fill #1
  Filled 2022-03-07: qty 30, 30d supply, fill #2

## 2021-07-13 MED ORDER — GLIPIZIDE 5 MG PO TABS
10.0000 mg | ORAL_TABLET | Freq: Two times a day (BID) | ORAL | 3 refills | Status: DC
  Filled 2021-07-13: qty 90, 23d supply, fill #0
  Filled 2021-09-25: qty 90, 23d supply, fill #1

## 2021-07-13 MED ORDER — METFORMIN HCL 1000 MG PO TABS
1000.0000 mg | ORAL_TABLET | Freq: Two times a day (BID) | ORAL | 3 refills | Status: DC
  Filled 2021-07-13: qty 60, 30d supply, fill #0
  Filled 2021-09-25: qty 60, 30d supply, fill #1

## 2021-07-13 MED ORDER — CLOPIDOGREL BISULFATE 75 MG PO TABS
75.0000 mg | ORAL_TABLET | Freq: Every day | ORAL | 0 refills | Status: AC
Start: 2021-07-14 — End: 2021-08-04
  Filled 2021-07-13: qty 21, 21d supply, fill #0

## 2021-07-13 NOTE — TOC CAGE-AID Note (Addendum)
Transition of Care [facility]) - CAGE-AID Screening   Patient Details  Name: Stephanie [name] MRN: [id number] Date of Birth: [birth year]  [facility]) CM/SW Contact:    Gabrella Stroh C Tarpley-Carter, LCSWA Phone Number: 07/13/2021, 2:37 PM   Clinical Narrative: Pt participated in Cage-Aid.  Pt stated she does use substance.  Pt was offered resources, due to usage of substance.     Anyla Israelson Tarpley-Carter, MSW, LCSW-A Pronouns:  She/Her/Hers [facility] Clinical Social Worker Direct Number:  [phone] [email or web address]   CAGE-AID Screening:    Have You Ever Felt You Ought to Cut Down on Your Drinking or Drug Use?: Yes Have People Annoyed You By [employer] Your Drinking Or Drug Use?: No Have You Felt Bad Or Guilty About Your Drinking Or Drug Use?: No Have You Ever Had a Drink or Used Drugs First Thing In The Morning to Steady Your Nerves or to Get Rid of a Hangover?: No CAGE-AID Score: 1  Substance Abuse Education Offered: Yes  Substance abuse interventions: Transport planner

## 2021-07-13 NOTE — Discharge Summary (Signed)
Physician Discharge Summary   Stephanie [name] [id number] DOB: 30-Mar-1970 DOA: 07/11/2021  PCP: Merryl Hacker, No  Admit date: 07/11/2021 Discharge date: 07/13/2021   Admitted From: home Disposition:  Home Discharging physician: Dwyane Dee, MD  Recommendations for Outpatient Follow-up:  Follow up with stroke clinic May need further BP med adjustment May need initiation of insulin  Home Health:  Equipment/Devices:   Discharge Condition: stable CODE STATUS: Full Diet recommendation:  Diet Orders (From admission, onward)     Start     Ordered   07/13/21 0000  Diet - low sodium heart healthy        07/13/21 1105   07/13/21 0000  Diet Carb Modified        07/13/21 1105   07/11/21 2102  Diet heart healthy/carb modified Room service appropriate? Yes; Fluid consistency: Thin  Diet effective now       Question Answer Comment  Diet-HS Snack? Nothing   Room service appropriate? Yes   Fluid consistency: Thin      07/11/21 2101            Hospital Course: Stephanie [name] is a 51 y.o. female with PMH DMII, HTN, noncompliance, ongoing substance use who presented with blurry vision.  She endorsed ongoing use of cocaine prior to admission.  She had developed blurry vision a couple days prior to admission. MRI brain showed acute/subacute infarct involving the left basal ganglia. 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 She was also followed by neurology.  She was started on aspirin and Plavix.  Assessment and Plan:  Acute CVA (cerebrovascular accident) ([place]) -A1c 7.9%; LDL 72 -Permissive hypertension allowed on admission.  Okay to start bringing pressure down slowly -Continue aspirin and Plavix for 3 weeks followed by aspirin alone per neurology recommendations -No needs from PT/OT   Essential hypertension - s/p permissive hypertension - Continue amlodipine, Coreg,  losartan; meds adjusted some prior to d/c - will need further follow up of BP and possible further adjustment -Patient educated that cocaine is still washing out of her system   Type 2 diabetes mellitus with diabetic neuropathy (HCC) -A1c 7.9% -Continue metformin and glipizide at discharge - Follow-up CBGs, may require insulin if still elevated glucose at follow-up   High cholesterol Continue Lipitor   Polysubstance abuse: UDS is positive for cocaine; she has been counseled -Seen by TOC prior to discharge as well    The patient's chronic medical conditions were treated accordingly per the patient's home medication regimen except as noted.  On day of discharge, patient was felt deemed stable for discharge. Patient/family member advised to call PCP or come back to ER if needed.   Principal Diagnosis: Acute CVA (cerebrovascular accident) [facility])  Discharge Diagnoses: Principal Problem:   Acute CVA (cerebrovascular accident) [facility]) Active Problems:   Essential hypertension   Type 2 diabetes mellitus with diabetic neuropathy ([place])   High cholesterol   Polysubstance abuse ([place])   Discharge Instructions     Diet - low sodium heart healthy   Complete by: As directed    Diet Carb Modified   Complete by: As directed    Increase activity slowly   Complete by: As directed       Allergies as of 07/13/2021       Reactions   Lisinopril Cough        Medication List     STOP taking these medications  fluticasone 50 MCG/ACT nasal spray Commonly known as: FLONASE   pseudoephedrine 120 MG 12 hr tablet Commonly known as: Sudafed 12 Hour       TAKE these medications    albuterol 108 (90 Base) MCG/ACT inhaler Commonly known as: VENTOLIN HFA Inhale 2 puffs into the lungs every 6 (six) hours as needed for wheezing or shortness of breath.   albuterol 108 (90 Base) MCG/ACT inhaler Commonly known as: VENTOLIN HFA INHALE 2 PUFFS INTO THE LUNGS EVERY 6 (SIX) HOURS AS NEEDED  FOR WHEEZING OR SHORTNESS OF BREATH.   amLODipine 10 MG tablet Commonly known as: NORVASC Take 1 tablet (10 mg total) by mouth daily. What changed:  how much to take Another medication with the same name was removed. Continue taking this medication, and follow the directions you see here.   aspirin EC 81 MG tablet Take 1 tablet (81 mg total) by mouth daily. Swallow whole. Start taking on: July 14, 2021   atorvastatin 40 MG tablet Commonly known as: LIPITOR Take 1 tablet (40 mg total) by mouth daily. Start taking on: July 14, 2021 What changed:  medication strength how much to take Another medication with the same name was removed. Continue taking this medication, and follow the directions you see here.   carvedilol 12.5 MG tablet Commonly known as: COREG Take 1 tablet (12.5 mg total) by mouth 2 (two) times daily with a meal. What changed:  how much to take Another medication with the same name was removed. Continue taking this medication, and follow the directions you see here.   clopidogrel 75 MG tablet Commonly known as: PLAVIX Take 1 tablet (75 mg total) by mouth daily for 21 days. Start taking on: July 14, 2021   gabapentin 300 MG capsule Commonly known as: NEURONTIN Take 1 capsule (300 mg total) by mouth 2 (two) times daily. What changed:  additional instructions Another medication with the same name was removed. Continue taking this medication, and follow the directions you see here.   glipiZIDE 5 MG tablet Commonly known as: GLUCOTROL Take 2 tablets (10 mg total) by mouth 2 (two) times daily before a meal. What changed:  how much to take how to take this when to take this Another medication with the same name was removed. Continue taking this medication, and follow the directions you see here.   losartan 25 MG tablet Commonly known as: COZAAR Take 3 tablets (75 mg total) by mouth daily. Start taking on: July 14, 2021 What changed:  medication strength how  much to take Another medication with the same name was removed. Continue taking this medication, and follow the directions you see here.   metFORMIN 1000 MG tablet Commonly known as: GLUCOPHAGE Take 1 tablet (1,000 mg total) by mouth 2 (two) times daily with a meal. What changed:  how much to take how to take this when to take this Another medication with the same name was removed. Continue taking this medication, and follow the directions you see here.   True Metrix Blood Glucose Test test strip Generic drug: glucose blood Use as instructed. Check blood glucose level by fingerstick twice per day.   TRUEplus Lancets 28G Misc Use as instructed. Check blood glucose level by fingerstick twice per day.        Follow-up [facility].   Contact information: [address] [place], [place] [address] [phone]        PRIMARY [facility]  [facility] .   Why: The MD office will contact you for an appointment, if you have not received a call by Tuesday 07/18/21 please call the office  [phone] Contact information: [address] [place] [phone]               Allergies  Allergen Reactions   Lisinopril Cough    Consultations: Neurology  Procedures:   Discharge Exam: BP (!) 150/118 (BP Location: Right Arm)   Pulse 83   Temp (!) 97.4 F (36.3 C) (Axillary)   Resp 18   Ht 4\' 11"  (1.499 m)   Wt 59.9 kg   SpO2 97%   BMI 26.67 kg/m  Physical Exam Constitutional:      Appearance: Normal appearance.  HENT:     Head: Normocephalic and atraumatic.     Mouth/Throat:     Mouth: Mucous membranes are moist.  Eyes:     Extraocular Movements: Extraocular movements intact.     Comments: Gross visual acuity intact  Cardiovascular:     Rate and Rhythm: Normal rate and regular rhythm.  Pulmonary:     Effort: Pulmonary effort is normal. No respiratory distress.     Breath sounds:  Normal breath sounds. No wheezing.  Abdominal:     General: Bowel sounds are normal. There is no distension.     Palpations: Abdomen is soft.     Tenderness: There is no abdominal tenderness.  Musculoskeletal:        General: Normal range of motion.     Cervical back: Normal range of motion and neck supple.  Skin:    General: Skin is warm.  Neurological:     General: No focal deficit present.     Mental Status: She is alert and oriented to person, place, and time.  Psychiatric:        Mood and Affect: Mood normal.        Behavior: Behavior normal.      The results of significant diagnostics from this hospitalization (including imaging, microbiology, ancillary and laboratory) are listed below for reference.   Microbiology: No results found for this or any previous visit (from the past 240 hour(s)).   Labs: BNP (last 3 results) No results for input(s): "BNP" in the last 8760 hours. Basic Metabolic Panel: Recent Labs  Lab 07/11/21 1100 07/12/21 0336  NA 139  --   K 3.2*  --   CL 109  --   CO2 21*  --   GLUCOSE 241*  --   BUN 13  --   CREATININE 1.08* 0.93  CALCIUM 8.7*  --    Liver Function Tests: Recent Labs  Lab 07/11/21 1100  AST 26  ALT 26  ALKPHOS 78  BILITOT 0.8  PROT 6.5  ALBUMIN 3.4*   No results for input(s): "LIPASE", "AMYLASE" in the last 168 hours. No results for input(s): "AMMONIA" in the last 168 hours. CBC: Recent Labs  Lab 07/11/21 1100 07/12/21 0336  WBC 6.2 5.4  HGB 13.1 12.2  HCT 37.8 35.2*  MCV 85.1 84.0  PLT 229 209   Cardiac Enzymes: No results for input(s): "CKTOTAL", "CKMB", "CKMBINDEX", "TROPONINI" in the last 168 hours. BNP: Invalid input(s): "POCBNP" CBG: Recent Labs  Lab 07/12/21 1109 07/12/21 1630 07/12/21 2115 07/13/21 0627 07/13/21 1209  GLUCAP 191* 290* 292* 243* 203*   D-Dimer No results for input(s): "DDIMER" in the last 72 hours. Hgb A1c Recent Labs    07/12/21 0336  HGBA1C 7.9*  Lipid  Profile Recent Labs    07/12/21 0336  CHOL 154  HDL 37*  LDLCALC 72  TRIG 223*  CHOLHDL 4.2   Thyroid function studies No results for input(s): "TSH", "T4TOTAL", "T3FREE", "THYROIDAB" in the last 72 hours.  Invalid input(s): "FREET3" Anemia work up No results for input(s): "VITAMINB12", "FOLATE", "FERRITIN", "TIBC", "IRON", "RETICCTPCT" in the last 72 hours. Urinalysis    Component Value Date/Time   COLORURINE RED (A) 11/21/2020 1128   APPEARANCEUR CLOUDY (A) 11/21/2020 1128   LABSPEC 1.017 11/21/2020 1128   PHURINE 5.0 11/21/2020 1128   GLUCOSEU >=500 (A) 11/21/2020 1128   HGBUR MODERATE (A) 11/21/2020 1128   BILIRUBINUR NEGATIVE 11/21/2020 1128   BILIRUBINUR negative 08/13/2020 1537   KETONESUR NEGATIVE 11/21/2020 1128   PROTEINUR 100 (A) 11/21/2020 1128   UROBILINOGEN 0.2 08/13/2020 1537   NITRITE NEGATIVE 11/21/2020 1128   LEUKOCYTESUR SMALL (A) 11/21/2020 1128   Sepsis Labs Recent Labs  Lab 07/11/21 1100 07/12/21 0336  WBC 6.2 5.4   Microbiology No results found for this or any previous visit (from the past 240 hour(s)).  Procedures/Studies: VAS [place] TRANSCRANIAL DOPPLER W BUBBLES  Result Date: 07/13/2021  Transcranial Doppler with Bubble Patient Name:  Stephanie [name] [facility]  Date of Exam:   07/12/2021 Medical Rec #: [id number]            Accession #:    [id number] Date of Birth: 1970-03-28            Patient Gender: F Patient Age:   47 years Exam Location:  [facility] Procedure:      VAS [place] TRANSCRANIAL DOPPLER W BUBBLES Referring Phys: Janine Ores --------------------------------------------------------------------------------  Indications: Stroke. Limitations: poor acoustic windows Comparison Study: No prior study Performing Technologist: Maudry Mayhew MHA, RDMS, RVT, RDCS  Examination Guidelines: A complete evaluation includes B-mode imaging, spectral Doppler, color Doppler, and power Doppler as needed of all accessible portions of each vessel. Bilateral  testing is considered an integral part of a complete examination. Limited examinations for reoccurring indications may be performed as noted.  Summary: No HITS at rest or during Valsalva. Negative transcranial Doppler Bubble study with no evidence of right to left intracardiac communication.  A vascular evaluation was performed. The left ICA siphon was studied. An IV was inserted into the patient's left forearm. Verbal informed consent was obtained.  Negayive TCD Bubble study without evidence of right to left shunt noted. *See table(s) above for TCD measurements and observations.  Diagnosing physician: Antony Contras MD Electronically signed by Antony Contras MD on 07/13/2021 at 10:06:09 AM.    Final    MR CERVICAL SPINE W WO CONTRAST  Result Date: 07/12/2021 CLINICAL DATA:  Multiple sclerosis (MS).  Concern for ms EXAM: MRI CERVICAL SPINE WITHOUT AND WITH CONTRAST TECHNIQUE: Multiplanar and multiecho pulse sequences of the cervical spine, to include the craniocervical junction and cervicothoracic junction, were obtained without and with intravenous contrast. CONTRAST:  18mL GADAVIST GADOBUTROL 1 MMOL/ML IV SOLN COMPARISON:  None Available. FINDINGS: Alignment: Mild reversal the normal cervical lordosis. No substantial sagittal subluxation. Vertebrae: Vertebral body heights are maintained. No evidence of acute fracture, discitis/osteomyelitis, or suspicious bone lesion. Cord: No evidence of abnormal cord signal or cord enhancement on motion limited assessment. Posterior Fossa, vertebral arteries, paraspinal tissues: Visualized vertebral artery flow voids are maintained. Posterior fossa better assessed on MRI head from yesterday. Disc levels: C2-C3: No significant disc protrusion, foraminal stenosis, or canal stenosis. C3-C4: Mild uncovertebral hypertrophy without significant canal or foraminal stenosis.  C4-C5: Left greater than right facet and uncovertebral hypertrophy with mild-to-moderate left foraminal stenosis. No  significant canal stenosis. C5-C6: Minimal uncovertebral hypertrophy without significant canal or foraminal stenosis. C6-C7: Mild right greater than left facet and uncovertebral hypertrophy with mild right foraminal stenosis. No significant canal or left foraminal stenosis. C7-T1: No significant disc protrusion, foraminal stenosis, or canal stenosis. IMPRESSION: 1. No evidence of abnormal cord signal or cord enhancement on motion limited assessment. 2. Mild multilevel degenerative change with mild-to-moderate left foraminal stenosis C4-C5 and mild right foraminal stenosis C6-C7. Electronically Signed   By: Margaretha Sheffield M.D.   On: 07/12/2021 11:22   MR BRAIN W CONTRAST  Result Date: 07/12/2021 CLINICAL DATA:  concern for MS EXAM: MRI HEAD WITH CONTRAST TECHNIQUE: Multiplanar, multiecho pulse sequences of the brain and surrounding structures were obtained with intravenous contrast. CONTRAST:  62mL GADAVIST GADOBUTROL 1 MMOL/ML IV SOLN COMPARISON:  MRI head July 11, 2021. FINDINGS: Only post contrast imaging was performed to further evaluate findings on MRI head from yesterday. No evidence of pathologic enhancement on the motion limited postcontrast imaging. See yesterday's MRI for additional findings. IMPRESSION: No evidence of pathologic enhancement on the motion limited postcontrast imaging. Electronically Signed   By: Margaretha Sheffield M.D.   On: 07/12/2021 11:17   ECHOCARDIOGRAM COMPLETE  Result Date: 07/12/2021    ECHOCARDIOGRAM REPORT   Patient Name:   Stephanie [name] Date of Exam: 07/12/2021 Medical Rec #:  [id number]           Height:       59.0 in Accession #:    [id number]          Weight:       132.1 lb Date of Birth:  11-02-1970           BSA:          1.546 m Patient Age:    60 years            BP:           179/90 mmHg Patient Gender: F                   HR:           87 bpm. Exam Location:  Inpatient Procedure: 2D Echo, Cardiac Doppler and Color Doppler Indications:    Dyspnea  History:         Patient has no prior history of Echocardiogram examinations.                 Risk Factors:Hypertension, Diabetes and Current Smoker. Tobacco                 and cocaine abuse.  Sonographer:    Joette Catching RCS Referring Phys: [place]  1. Left ventricular ejection fraction, by estimation, is 60 to 65%. The left ventricle has normal function. The left ventricle has no regional wall motion abnormalities. There is severe concentric left ventricular hypertrophy. Left ventricular diastolic  parameters are consistent with Grade II diastolic dysfunction (pseudonormalization).  2. Right ventricular systolic function is normal. The right ventricular size is normal. There is mildly elevated pulmonary artery systolic pressure. The estimated right ventricular systolic pressure is 0000000 mmHg.  3. Left atrial size was mildly dilated.  4. The mitral valve is normal in structure. Trivial mitral valve regurgitation. No evidence of mitral stenosis.  5. The aortic valve is tricuspid. There is mild thickening of the aortic valve. Aortic valve regurgitation is  not visualized. Aortic valve sclerosis is present, with no evidence of aortic valve stenosis.  6. The inferior vena cava is normal in size with <50% respiratory variability, suggesting right atrial pressure of 8 mmHg. Comparison(s): No prior Echocardiogram. FINDINGS  Left Ventricle: Left ventricular ejection fraction, by estimation, is 60 to 65%. The left ventricle has normal function. The left ventricle has no regional wall motion abnormalities. The left ventricular internal cavity size was normal in size. There is  severe concentric left ventricular hypertrophy. Left ventricular diastolic parameters are consistent with Grade II diastolic dysfunction (pseudonormalization). Right Ventricle: The right ventricular size is normal. No increase in right ventricular wall thickness. Right ventricular systolic function is normal. There is mildly elevated  pulmonary artery systolic pressure. The tricuspid regurgitant velocity is 2.96  m/s, and with an assumed right atrial pressure of 8 mmHg, the estimated right ventricular systolic pressure is 0000000 mmHg. Left Atrium: Left atrial size was mildly dilated. Right Atrium: Right atrial size was normal in size. Pericardium: There is no evidence of pericardial effusion. Mitral Valve: The mitral valve is normal in structure. There is mild thickening of the mitral valve leaflet(s). Trivial mitral valve regurgitation. No evidence of mitral valve stenosis. Tricuspid Valve: The tricuspid valve is normal in structure. Tricuspid valve regurgitation is trivial. Aortic Valve: The aortic valve is tricuspid. There is mild thickening of the aortic valve. Aortic valve regurgitation is not visualized. Aortic valve sclerosis is present, with no evidence of aortic valve stenosis. Aortic valve mean gradient measures 5.0  mmHg. Aortic valve peak gradient measures 8.4 mmHg. Aortic valve area, by VTI measures 2.07 cm. Pulmonic Valve: The pulmonic valve was normal in structure. Pulmonic valve regurgitation is trivial. Aorta: The aortic root and ascending aorta are structurally normal, with no evidence of dilitation. Venous: The inferior vena cava is normal in size with less than 50% respiratory variability, suggesting right atrial pressure of 8 mmHg. IAS/Shunts: The atrial septum is grossly normal.  LEFT VENTRICLE PLAX 2D LVIDd:         3.90 cm     Diastology LVIDs:         2.60 cm     LV e' medial:    6.81 cm/s LV PW:         1.60 cm     LV E/e' medial:  13.5 LV IVS:        1.60 cm     LV e' lateral:   6.73 cm/s LVOT diam:     1.90 cm     LV E/e' lateral: 13.7 LV SV:         54 LV SV Index:   35 LVOT Area:     2.84 cm  LV Volumes (MOD) LV vol d, MOD A2C: 52.0 ml LV vol d, MOD A4C: 87.7 ml LV vol s, MOD A2C: 27.2 ml LV vol s, MOD A4C: 43.2 ml LV SV MOD A2C:     24.8 ml LV SV MOD A4C:     87.7 ml LV SV MOD BP:      32.8 ml RIGHT VENTRICLE              IVC RV Basal diam:  3.50 cm     IVC diam: 1.90 cm RV Mid diam:    2.00 cm RV S prime:     14.70 cm/s TAPSE (M-mode): 2.3 cm LEFT ATRIUM             Index  RIGHT ATRIUM           Index LA diam:        4.10 cm 2.65 cm/m   RA Area:     11.90 cm LA Vol (A2C):   44.0 ml 28.46 ml/m  RA Volume:   26.10 ml  16.88 ml/m LA Vol (A4C):   53.6 ml 34.67 ml/m LA Biplane Vol: 49.1 ml 31.76 ml/m  AORTIC VALVE                     PULMONIC VALVE AV Area (Vmax):    2.17 cm      PR End Diast Vel: 5.20 msec AV Area (Vmean):   2.10 cm AV Area (VTI):     2.07 cm AV Vmax:           145.00 cm/s AV Vmean:          102.000 cm/s AV VTI:            0.259 m AV Peak Grad:      8.4 mmHg AV Mean Grad:      5.0 mmHg LVOT Vmax:         111.00 cm/s LVOT Vmean:        75.500 cm/s LVOT VTI:          0.189 m LVOT/AV VTI ratio: 0.73  AORTA Ao Root diam: 2.80 cm Ao Asc diam:  3.20 cm MITRAL VALVE               TRICUSPID VALVE MV Area (PHT): 9.25 cm    TR Peak grad:   35.0 mmHg MV Decel Time: 82 msec     TR Vmax:        296.00 cm/s MV E velocity: 92.10 cm/s MV A velocity: 84.40 cm/s  SHUNTS MV E/A ratio:  1.09        Systemic VTI:  0.19 m                            Systemic Diam: 1.90 cm Laurance Flatten MD Electronically signed by Laurance Flatten MD Signature Date/Time: 07/12/2021/10:21:38 AM    Final    CT ANGIO HEAD NECK W WO CM  Result Date: 07/12/2021 CLINICAL DATA:  Stroke follow-up EXAM: CT ANGIOGRAPHY HEAD AND NECK TECHNIQUE: Multidetector CT imaging of the head and neck was performed using the standard protocol during bolus administration of intravenous contrast. Multiplanar CT image reconstructions and MIPs were obtained to evaluate the vascular anatomy. Carotid stenosis measurements (when applicable) are obtained utilizing NASCET criteria, using the distal internal carotid diameter as the denominator. RADIATION DOSE REDUCTION: This exam was performed according to the departmental dose-optimization program which includes  automated exposure control, adjustment of the mA and/or kV according to patient size and/or use of iterative reconstruction technique. CONTRAST:  25mL OMNIPAQUE IOHEXOL 350 MG/ML SOLN COMPARISON:  Brain MRI and head CT 07/11/2021 FINDINGS: CT HEAD FINDINGS Brain: There is no mass, hemorrhage or extra-axial collection. The size and configuration of the ventricles and extra-axial CSF spaces are normal. There is no acute or chronic infarction. The brain parenchyma is normal. Skull: The visualized skull base, calvarium and extracranial soft tissues are normal. Sinuses/Orbits: No fluid levels or advanced mucosal thickening of the visualized paranasal sinuses. No mastoid or middle ear effusion. The orbits are normal. CTA NECK FINDINGS SKELETON: There is no bony spinal canal stenosis. No lytic or blastic lesion. OTHER NECK: Normal pharynx, larynx and  major salivary glands. No cervical lymphadenopathy. Unremarkable thyroid gland. UPPER CHEST: No pneumothorax or pleural effusion. No nodules or masses. AORTIC ARCH: There is no calcific atherosclerosis of the aortic arch. There is no aneurysm, dissection or hemodynamically significant stenosis of the visualized portion of the aorta. Conventional 3 vessel aortic branching pattern. The visualized proximal subclavian arteries are widely patent. RIGHT CAROTID SYSTEM: The right internal carotid artery is occluded at its origin with no reconstitution in the neck. LEFT CAROTID SYSTEM: There is eccentric soft plaque within the distal common carotid artery a causes 50% stenosis. The left ICA is widely patent to the skull base. VERTEBRAL ARTERIES: Left dominant configuration. Both origins are clearly patent. There is no dissection, occlusion or flow-limiting stenosis to the skull base (V1-V3 segments). CTA HEAD FINDINGS POSTERIOR CIRCULATION: --Vertebral arteries: Normal V4 segments. --Inferior cerebellar arteries: Normal. --Basilar artery: Normal. --Superior cerebellar arteries:  Normal. --Posterior cerebral arteries (PCA): Normal. ANTERIOR CIRCULATION: --Intracranial internal carotid arteries: Severe narrowing of the right ICA at the skull base. Mild left ICA stenosis due to atherosclerotic plaque calcification --Anterior cerebral arteries (ACA): Normal. Both A1 segments are present. Patent anterior communicating artery (a-comm). --Middle cerebral arteries (MCA): Normal. VENOUS SINUSES: As permitted by contrast timing, patent. ANATOMIC VARIANTS: None Review of the MIP images confirms the above findings. IMPRESSION: 1. 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 2. Severely narrowed skull base and intracranial segments of the right ICA. 3. 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 Electronically Signed   By: Ulyses Jarred M.D.   On: 07/12/2021 02:27   MR BRAIN WO CONTRAST  Result Date: 07/11/2021 CLINICAL DATA:  Blurred vision and headache.  Hypertension EXAM: MRI HEAD WITHOUT CONTRAST TECHNIQUE: Multiplanar, multiecho pulse sequences of the brain and surrounding structures were obtained without intravenous contrast. COMPARISON:  No prior MRI, correlation is made with CT head 07/11/2021 FINDINGS: Evaluation is somewhat limited by motion artifact. Brain: Focus of restricted diffusion in the left basal ganglia (series 5, image 78) with ADC correlate (series 6, image 30), consistent with acute or subacute infarct, which measures up to 6 mm in greatest dimension. This may involve the anterior limb of the internal capsule. This is associated with increased T2 signal. No acute hemorrhage, mass, mass effect, or midline shift. No hydrocephalus or extra-axial collection. Scattered T2 hyperintense signal in the periventricular white matter, likely the sequela of chronic small vessel ischemic disease. Vascular: Loss of the right ICA flow void (series 8, image 7-9). Otherwise normal arterial flow voids. Skull and upper cervical spine:  Limited evaluation due to motion. Sinuses/Orbits: No acute finding. Other: The mastoids are well aerated. IMPRESSION: 1. Evaluation is somewhat limited by motion artifact. Within this limitation, there is an acute or subacute infarct in the left basal ganglia, which may involve the anterior limb of the internal capsule. 2. Loss of the right ICA flow void, which could indicate poor flow or occlusion. A CTA of the head and neck is recommended for further evaluation. These results were called by telephone at the time of interpretation on 07/11/2021 at 3:20 pm to provider [facility] , who verbally acknowledged these results. Electronically Signed   By: Merilyn Baba M.D.   On: 07/11/2021 15:20   CT Head Wo Contrast  Result Date: 07/11/2021 CLINICAL DATA:  Head trauma, GCS=15, severe headache EXAM: CT HEAD WITHOUT CONTRAST TECHNIQUE: Contiguous axial images were obtained from the base of the skull through the vertex  without intravenous contrast. RADIATION DOSE REDUCTION: This exam was performed according to the departmental dose-optimization program which includes automated exposure control, adjustment of the mA and/or kV according to patient size and/or use of iterative reconstruction technique. COMPARISON:  None Available. FINDINGS: Brain: There is no acute intracranial hemorrhage or mass effect. Gray-white differentiation is preserved. Suspect focal hypoattenuation along the splenium of the corpus callosum extending primarily into the left pericallosal white matter. There is no extra-axial fluid collection. Ventricles and sulci are within normal limits in size and configuration. Vascular: There is atherosclerotic calcification at the skull base. Skull: Calvarium is unremarkable. Sinuses/Orbits: No acute finding. Other: None. IMPRESSION: Age-indeterminate low-density along the splenium of the corpus callosum extending into the adjacent white matter. This may reflect gliosis with no expansion or mass effect to  suggest a lesion, but MRI is recommended for further evaluation. Electronically Signed   By: Macy Mis M.D.   On: 07/11/2021 11:17     Time coordinating discharge: Over 30 minutes    Dwyane Dee, MD  [facility] 07/13/2021, 4:43 PM

## 2021-07-13 NOTE — Progress Notes (Signed)
STROKE TEAM PROGRESS NOTE   INTERVAL HISTORY Her husband at the bedside.  Patient is ambulating with physical therapy in the hallway.   . TCD bubble study was negative for PFO.Marland Kitchen C spine with and wo and MRI brain with contrast were both unremarkable.  Patient is willing to quit cocaine and cigarettes.  Neurological exam is unchanged.  Vital signs are stable.  Echocardiogram showed ejection fraction 60 to 65%.  Right eye blurred vision appears to have improved  Vitals:   07/13/21 0323 07/13/21 0400 07/13/21 0730 07/13/21 0745  BP: (!) 157/87  (!) 191/120 (!) 176/90  Pulse: 84 81 81   Resp: 16 14 18    Temp: 98.5 F (36.9 C)  98 F (36.7 C)   TempSrc: Oral  Oral   SpO2: 97% 98% 99%   Weight:      Height:       CBC:  Recent Labs  Lab 07/11/21 1100 07/12/21 0336  WBC 6.2 5.4  HGB 13.1 12.2  HCT 37.8 35.2*  MCV 85.1 84.0  PLT 229 [id number]   Basic Metabolic Panel:  Recent Labs  Lab 07/11/21 1100 07/12/21 0336  NA 139  --   K 3.2*  --   CL 109  --   CO2 21*  --   GLUCOSE 241*  --   BUN 13  --   CREATININE 1.08* 0.93  CALCIUM 8.7*  --    Lipid Panel:  Recent Labs  Lab 07/12/21 0336  CHOL 154  TRIG 223*  HDL 37*  CHOLHDL 4.2  VLDL 45*  LDLCALC 72   HgbA1c:  Recent Labs  Lab 07/12/21 0336  HGBA1C 7.9*   Urine Drug Screen:  Recent Labs  Lab 07/11/21 1130  LABOPIA NONE DETECTED  COCAINSCRNUR POSITIVE*  LABBENZ NONE DETECTED  AMPHETMU NONE DETECTED  THCU POSITIVE*  LABBARB NONE DETECTED    Alcohol Level No results for input(s): "ETH" in the last 168 hours.  IMAGING past 24 hours VAS [place] TRANSCRANIAL DOPPLER W BUBBLES  Result Date: 07/13/2021  Transcranial Doppler with Bubble Patient Name:  Stephanie [name] [facility]  Date of Exam:   07/12/2021 Medical Rec #: [id number]            Accession #:    [id number] Date of Birth: 16-May-1970            Patient Gender: F Patient Age:   69 years Exam Location[facility]  [facility] Procedure:      VAS [place] TRANSCRANIAL DOPPLER W BUBBLES  Referring Phys: Janine Ores --------------------------------------------------------------------------------  Indications: Stroke. Limitations: poor acoustic windows Comparison Study: No prior study Performing Technologist: Maudry Mayhew MHA, RDMS, RVT, RDCS  Examination Guidelines: A complete evaluation includes B-mode imaging, spectral Doppler, color Doppler, and power Doppler as needed of all accessible portions of each vessel. Bilateral testing is considered an integral part of a complete examination. Limited examinations for reoccurring indications may be performed as noted.  Summary: No HITS at rest or during Valsalva. Negative transcranial Doppler Bubble study with no evidence of right to left intracardiac communication.  A vascular evaluation was performed. The left ICA siphon was studied. An IV was inserted into the patient's left forearm. Verbal informed consent was obtained.  Negayive TCD Bubble study without evidence of right to left shunt noted. *See table(s) above for TCD measurements and observations.  Diagnosing physician: Antony Contras MD Electronically signed by Antony Contras MD on 07/13/2021 at 10:06:09 AM.    Final    MR CERVICAL SPINE W  WO CONTRAST  Result Date: 07/12/2021 CLINICAL DATA:  Multiple sclerosis (MS).  Concern for ms EXAM: MRI CERVICAL SPINE WITHOUT AND WITH CONTRAST TECHNIQUE: Multiplanar and multiecho pulse sequences of the cervical spine, to include the craniocervical junction and cervicothoracic junction, were obtained without and with intravenous contrast. CONTRAST:  61mL GADAVIST GADOBUTROL 1 MMOL/ML IV SOLN COMPARISON:  None Available. FINDINGS: Alignment: Mild reversal the normal cervical lordosis. No substantial sagittal subluxation. Vertebrae: Vertebral body heights are maintained. No evidence of acute fracture, discitis/osteomyelitis, or suspicious bone lesion. Cord: No evidence of abnormal cord signal or cord enhancement on motion limited assessment. Posterior Fossa,  vertebral arteries, paraspinal tissues: Visualized vertebral artery flow voids are maintained. Posterior fossa better assessed on MRI head from yesterday. Disc levels: C2-C3: No significant disc protrusion, foraminal stenosis, or canal stenosis. C3-C4: Mild uncovertebral hypertrophy without significant canal or foraminal stenosis. C4-C5: Left greater than right facet and uncovertebral hypertrophy with mild-to-moderate left foraminal stenosis. No significant canal stenosis. C5-C6: Minimal uncovertebral hypertrophy without significant canal or foraminal stenosis. C6-C7: Mild right greater than left facet and uncovertebral hypertrophy with mild right foraminal stenosis. No significant canal or left foraminal stenosis. C7-T1: No significant disc protrusion, foraminal stenosis, or canal stenosis. IMPRESSION: 1. No evidence of abnormal cord signal or cord enhancement on motion limited assessment. 2. Mild multilevel degenerative change with mild-to-moderate left foraminal stenosis C4-C5 and mild right foraminal stenosis C6-C7. Electronically Signed   By: Margaretha Sheffield M.D.   On: 07/12/2021 11:22   MR BRAIN W CONTRAST  Result Date: 07/12/2021 CLINICAL DATA:  concern for MS EXAM: MRI HEAD WITH CONTRAST TECHNIQUE: Multiplanar, multiecho pulse sequences of the brain and surrounding structures were obtained with intravenous contrast. CONTRAST:  73mL GADAVIST GADOBUTROL 1 MMOL/ML IV SOLN COMPARISON:  MRI head July 11, 2021. FINDINGS: Only post contrast imaging was performed to further evaluate findings on MRI head from yesterday. No evidence of pathologic enhancement on the motion limited postcontrast imaging. See yesterday's MRI for additional findings. IMPRESSION: No evidence of pathologic enhancement on the motion limited postcontrast imaging. Electronically Signed   By: Margaretha Sheffield M.D.   On: 07/12/2021 11:17    PHYSICAL EXAM  Physical Exam  Constitutional: Appears well-developed and well-nourished.   Cardiovascular: Normal rate and regular rhythm.  Respiratory: Effort normal, non-labored breathing  Neuro: Mental Status: Patient is awake, alert, oriented to person, place, month, year, and situation. Patient is able to give a clear and coherent history. No signs of aphasia or neglect Cranial Nerves: II: Visual Fields are full. Pupils are equal, round, and reactive to light.  Subjective blurred vision more in the left eye than the right but no objective visual field defect. III,IV, VI: EOMI without ptosis or diploplia.    No afferent pupillary defect V: Facial sensation is symmetric to temperature VII: Facial movement is symmetric resting and smiling VIII: Hearing is intact to voice X: Palate elevates symmetrically XI: Shoulder shrug is symmetric. XII: Tongue protrudes midline without atrophy or fasciculations.  Motor: Tone is normal. Bulk is normal. 5/5 strength was present in all four extremities.  Sensory: Sensation is symmetric to light touch and temperature in the arms and legs. No extinction to DSS present.  Cerebellar: FNF and HKS are intact bilaterally   ASSESSMENT/PLAN [name] is a 51 y.o. female with history of DM, HLD, HTN presenting with blurry vision, headache, and hypertension. MRI was obtained which does demonstrate a subcortical stroke on the left. When testing, her vision out of her  right eye is blurry in comparison to the left. MRI with contrast of brain and MRI C-spine with and w/o contrast ordered to further evaluate white matter changes seen on previous imaging. Plan for TCD bubble study today. Resume home antihypertensive as infarct is subacute and we can start normalizing BP.   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  Subjective blurred vision bilaterally of unclear etiology.   Code Stroke CT head Age-indeterminate low-density along the splenium of the corpus callosum extending into the  adjacent white matter. 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 Severely narrowed skull base and intracranial segments of the right ICA. 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 MRI  subacute infarct in the left basal ganglia, which may involve the anterior limb of the internal capsule. Loss of the right ICA flow void, which could indicate poor flow or occlusion.  2D Echo EF 60-65% TCD bubble- pending LDL 72 HgbA1c 7.9 VTE prophylaxis - lovenox    Diet   Diet heart healthy/carb modified Room service appropriate? Yes; Fluid consistency: Thin   No antithrombotic prior to admission, now on aspirin 81 mg daily and clopidogrel 75 mg daily.  Therapy recommendations:  Outpatient OT Disposition:  pending   Hypertension Home meds:  amlodipine, coreg, losartan Unstable- resume home medications Permissive hypertension (OK if < 220/120) but gradually normalize in 5-7 days Long-term BP goal normotensive  Hyperlipidemia Home meds:  Atorvastatin 10mg , resumed in hospital LDL 72, goal < 70 Increase to 20mg  Continue statin at discharge  Diabetes type II Uncontrolled Home meds:  glipizide, metformin HgbA1c 7.9, goal < 7.0 CBGs Recent Labs    07/12/21 1630 07/12/21 2115 07/13/21 0627  GLUCAP 290* 292* 243*    SSI  Other Stroke Risk Factors Cigarette smoker, advised to stop smoking ETOH use, alcohol level No results found for requested labs within last 26280 hours., advised to drink no more than 1 drink(s) a day Substance abuse - UDS:  THC POSITIVE, Cocaine POSITIVE. Patient advised to stop using due to stroke risk.  Other Active Problems Hypokalemia- replace per primary team   Hospital day # 2  Patient seen and examined by NP/APP with MD. MD to update note as needed.     Patient presents with subjective bilateral blurred vision which appears to be of unclear etiology and MRI shows what appears to  be silent subcortical infarct in the setting of cocaine abuse.  Recommend continue ongoing stroke work-up.  Check TCD bubble study for PFO.  ANA panel, anticardiolipin antibodies, 2D echo, continue cardiac monitoring.  Aspirin and Plavix for 3 weeks followed by aspirin alone.  Patient counseled to quit using cocaine and smoking cigarettes and seems agreeable.  Aggressive risk factor modification.  Upon request from the patient I spoke to her sister Wandra Feinstein over the phone and updated her about the patient's condition and answered questions.  Follow-up with an outpatient stroke clinic in 2 months.  Greater than 50% time during this 35-minute visit was spent in counseling and coordination of care about her having lacunar stroke and subjective blurred vision and answering questions.  Discussed with Dr. Sabino Gasser.  Antony Contras, MD Medical Director [facility] Pager: [phone] 07/13/2021 10:35 AM   To contact Stroke Continuity provider, please refer to [email or web address] After hours, contact General Neurology

## 2021-07-13 NOTE — Progress Notes (Addendum)
Discharge instructions reviewed with the patient to include new and changed medications as well as medications to be stopped. TOC pharmacy medications given to the patient as well as her return to work note..  To the door via wheelchair. Home via POV with family driving.

## 2021-07-13 NOTE — Progress Notes (Signed)
Latest Reference Range & Units 07/12/21 06:11 07/12/21 11:09 07/12/21 16:30 07/12/21 21:15 07/13/21 06:27  Glucose-Capillary 70 - 99 mg/dL 825 (H) 003 (H) 704 (H) 292 (H) 243 (H)  (H): Data is abnormally high   Noted that blood sugars have been greater than 180 mg/dl. Recommend increasing Novolog meal coverage to 6 units TID with meals if patient eats at least 50% of meal. Titrate dosages as needed.  Smith Mince RN BSN CDE Diabetes Coordinator Pager: [phone]  8am-5pm

## 2021-07-13 NOTE — TOC Transition Note (Signed)
Transition of Care [facility]) - CM/SW Discharge Note   Patient Details  Name: Stephanie [name] MRN: [id number] Date of Birth: 01-09-1971  Transition of Care [facility]) CM/SW Contact:  Kermit Balo, RN Phone Number: 07/13/2021, 11:18 AM   Clinical Narrative:    Patient is discharging home with self care. Medications for home to be delivered to the room per [facility] pharmacy. MATCH provided to assist with the cost.  Patient's PCP: Primary Care at [facility] to call her with hospital follow up appointment.  Pt states she has transportation home.   Final next level of care: Home/Self Care Barriers to Discharge: Inadequate or no insurance, Barriers Unresolved (comment)   Patient Goals and CMS Choice        Discharge Placement                       Discharge Plan and Services   Discharge Planning Services: CM Consult                                 Social Determinants of Health (SDOH) Interventions     Readmission Risk Interventions     No data to display

## 2021-07-13 NOTE — Progress Notes (Addendum)
Occupational Therapy Treatment Patient Details Name: Stephanie [name] MRN: [id number] DOB: 02/14/1970 Today's Date: 07/13/2021   History of present illness Pt is a 51 y/o female presenting on 6/6 with blurred vision. 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 PMH includes: DM2, HTN with noncompliance of medication, ongoing tobacco/cocaine abuse.   OT comments  Pt seated EOB upon entry, able to complete ADLs with NT and no assist per report.  Pt completed 3 step trail making task in distracting environment with independence.  Also completed simple math calculations during task using coins with independence- as pt is a Conservation officer, nature at [employer]; anticipate she is at baseline cognition level.  Discussed recommendations to use pill box for med mgmt at dc, reviewed stroke signs/symptoms and importance of rest.  Patient progressing well and updated dc plan to NO OT follow up. BP remains elevated during session but within parameters.  Will follow acutely.    Recommendations for follow up therapy are one component of a multi-disciplinary discharge planning process, led by the attending physician.  Recommendations may be updated based on patient status, additional functional criteria and insurance authorization.    Follow Up Recommendations  No OT follow up    Assistance Recommended at Discharge Intermittent Supervision/Assistance  Patient can return home with the following  Assist for transportation   Equipment Recommendations  None recommended by OT    Recommendations for Other Services      Precautions / Restrictions Precautions Precautions: Fall Restrictions Weight Bearing Restrictions: No       Mobility Bed Mobility Overal bed mobility: Modified Independent                  Transfers Overall transfer level: Modified independent                       Balance Overall balance assessment: Mild deficits observed, not formally tested                                          ADL either performed or assessed with clinical judgement   ADL Overall ADL's : Needs assistance/impaired     Grooming: Modified independent;Standing               Lower Body Dressing: Modified independent;Sit to/from stand   Toilet Transfer: Modified Independent;Ambulation           Functional mobility during ADLs: Modified independent      Extremity/Trunk Assessment              Vision   Additional Comments: improved vision, reports at times still blurry but able to navigate enviorment without cueing, read small letters on Mission board today   Perception     Praxis      Cognition Arousal/Alertness: Awake/alert Behavior During Therapy: WFL for tasks assessed/performed Overall Cognitive Status: Within Functional Limits for tasks assessed                                 General Comments: patient demonstrates good awareness, able to complete and recall 3 step trail making task with independence given increased time during distracting enviornment, complete simple calcuations with coins Theatre stage manager at [employer]).  appears at baseline        Exercises  Shoulder Instructions       General Comments BP remains elevated but within permissive htn levels    Pertinent Vitals/ Pain       Pain Assessment Pain Assessment: No/denies pain  Home Living                                          Prior Functioning/Environment              Frequency  Min 2X/week        Progress Toward Goals  OT Goals(current goals can now be found in the care plan section)  Progress towards OT goals: Progressing toward goals  Acute Rehab OT Goals Patient Stated Goal: back to work OT Goal Formulation: With patient Time For Goal Achievement: 07/26/21 Potential to Achieve Goals: Good  Plan Frequency remains appropriate;Discharge plan needs to be updated    Co-evaluation                  AM-PAC OT "6 Clicks" Daily Activity     Outcome Measure   Help from another person eating meals?: None Help from another person taking care of personal grooming?: None Help from another person toileting, which includes using toliet, bedpan, or urinal?: None Help from another person bathing (including washing, rinsing, drying)?: None Help from another person to put on and taking off regular upper body clothing?: None Help from another person to put on and taking off regular lower body clothing?: None 6 Click Score: 24    End of Session    OT Visit Diagnosis: Unsteadiness on feet (R26.81);Other symptoms and signs involving cognitive function   Activity Tolerance Patient tolerated treatment well   Patient Left in bed;with call bell/phone within reach;with bed alarm set;with family/visitor present   Nurse Communication Mobility status        Time: 8502-7741 OT Time Calculation (min): 18 min  Charges: OT General Charges $OT Visit: 1 Visit OT Treatments $Self Care/Home Management : 8-22 mins  Barry Brunner, OT Acute Rehabilitation Services Office [phone]   Chancy Milroy 07/13/2021, 9:51 AM

## 2021-07-14 LAB — CARDIOLIPIN ANTIBODIES, IGG, IGM, IGA
Anticardiolipin IgA: <9 APL U/mL (ref 0–11)
Anticardiolipin IgG: <9 GPL U/mL (ref 0–14)
Anticardiolipin IgM: 23 MPL U/mL — ABNORMAL HIGH (ref 0–12)

## 2021-07-14 NOTE — Progress Notes (Signed)
Kindly inform the patient that 1 of Blood test for abnormal clotting was slightly abnormal but I am not sure if this is significant.  I recommend repeating this blood test when patient follows up with me in the office in 2 to 3 months

## 2021-07-17 ENCOUNTER — Telehealth: Payer: Self-pay | Admitting: *Deleted

## 2021-07-17 NOTE — Telephone Encounter (Signed)
-----   Message from Micki Riley, MD sent at 07/14/2021  4:54 PM EDT ----- Joneen Roach inform the patient that 1 of Blood test for abnormal clotting was slightly abnormal but I am not sure if this is significant.  I recommend repeating this blood test when patient follows up with me in the office in 2 to 3 months

## 2021-07-17 NOTE — Telephone Encounter (Signed)
I spoke to the patient and provided her with the lab results. She has been scheduled for a hospital follow up with Dr Leonie Man on 09/27/21.

## 2021-07-21 ENCOUNTER — Other Ambulatory Visit: Payer: Self-pay | Admitting: *Deleted

## 2021-07-21 NOTE — Patient Instructions (Signed)
Visit Information  Thank you for taking time to visit with me today. Please don't hesitate to contact me if I can be of assistance to you before our next scheduled telephone appointment.  Following are the goals we discussed today:  Obtain blood pressure monitor and check daily, write down readings and share with provider.   Our next appointment is by telephone in 2 weeks  The patient verbalized understanding of instructions, educational materials, and care plan provided today and agreed to receive a mailed copy of patient instructions, educational materials, and care plan.   The patient has been provided with contact information for the care management team and has been advised to call with any health related questions or concerns.   Kemper Durie, RN, MSN, CCM [facility] Care Management  [facility] Manager [phone]

## 2021-07-21 NOTE — Patient Outreach (Signed)
[facility]) Care Management  07/21/2021  Brionna Romanek [facility] [birth year] [id number]   RED ON EMMI ALERT - Stroke Day # 6 Date: 6/15 Red Alert Reason: Smoked or been around smoke?  YES   Outreach attempt #1, successful.  Identity verified.  This care manager introduced self and stated purpose of call.  [facility] care management services explained.    She report feeling as if she is recovering well from stroke.  Lives alone, independent in all ADL's.  Has family in area that provide support as needed.  No home health was needed.  Follow up with new PCP scheduled for 6/19, neurology follow up in August.  Admits that she is still smoking, but has decreased the frequency.  Discussed cessation options, state she would rather quit cold [place].    State she has obtained all of her medications, taking as instructed.  Does not have blood pressure monitor for daily checks, state BP was normal at hospital. Encouraged to purchase one as managing levels could decrease risk of recurrent stroke.  Verbalized understanding.  Denies any urgent concerns, encouraged to contact this care manager with questions.    Plan: RN CM will send member education regarding stroke recovery and prevention, to include diet smoking cessation.  Will follow up within the next 2 weeks.  Kemper Durie, RN, MSN, CCM [facility] Care Management  [facility] Manager [phone]

## 2021-07-21 NOTE — Patient Outreach (Signed)
Received a red flag Emmi stroke notification for Ms. Armijo . I have assigned Kemper Durie, RN to call for follow up and determine if there are any Case Management needs.    Iverson Alamin, Donivan Scull [facility] Care Management Assistant [facility] Care Management [phone]

## 2021-07-24 ENCOUNTER — Inpatient Hospital Stay (INDEPENDENT_AMBULATORY_CARE_PROVIDER_SITE_OTHER): Payer: Self-pay | Admitting: Primary Care

## 2021-07-31 ENCOUNTER — Emergency Department (HOSPITAL_COMMUNITY): Payer: Self-pay

## 2021-07-31 ENCOUNTER — Emergency Department (HOSPITAL_COMMUNITY)
Admission: EM | Admit: 2021-07-31 | Discharge: 2021-07-31 | Disposition: A | Discharge status: Home / Self Care | Payer: Self-pay | Attending: Emergency Medicine | Admitting: Emergency Medicine

## 2021-07-31 ENCOUNTER — Encounter (HOSPITAL_COMMUNITY): Payer: Self-pay

## 2021-07-31 DIAGNOSIS — Z7902 Long term (current) use of antithrombotics/antiplatelets: Secondary | ICD-10-CM | POA: Insufficient documentation

## 2021-07-31 DIAGNOSIS — Z7982 Long term (current) use of aspirin: Secondary | ICD-10-CM | POA: Insufficient documentation

## 2021-07-31 DIAGNOSIS — Z8673 Personal history of transient ischemic attack (TIA), and cerebral infarction without residual deficits: Secondary | ICD-10-CM | POA: Insufficient documentation

## 2021-07-31 DIAGNOSIS — R202 Paresthesia of skin: Secondary | ICD-10-CM | POA: Insufficient documentation

## 2021-07-31 LAB — BASIC METABOLIC PANEL
Anion gap: 10 (ref 5–15)
BUN: 10 mg/dL (ref 6–20)
CO2: 18 mmol/L — ABNORMAL LOW (ref 22–32)
Calcium: 9.1 mg/dL (ref 8.9–10.3)
Chloride: 109 mmol/L (ref 98–111)
Creatinine, Ser: 1.11 mg/dL — ABNORMAL HIGH (ref 0.44–1.00)
GFR, Estimated: >60 mL/min (ref >60)
Glucose, Bld: 157 mg/dL — ABNORMAL HIGH (ref 70–99)
Potassium: 3.6 mmol/L (ref 3.5–5.1)
Sodium: 137 mmol/L (ref 135–145)

## 2021-07-31 LAB — CBC WITH DIFFERENTIAL/PLATELET
Abs Immature Granulocytes: 0.02 10*3/uL (ref 0.00–0.07)
Basophils Absolute: 0 10*3/uL (ref 0.0–0.1)
Basophils Relative: 1 %
Eosinophils Absolute: 0.1 10*3/uL (ref 0.0–0.5)
Eosinophils Relative: 1 %
HCT: 37.5 % (ref 36.0–46.0)
Hemoglobin: 13.2 g/dL (ref 12.0–15.0)
Immature Granulocytes: 0 %
Lymphocytes Relative: 27 %
Lymphs Abs: 2 10*3/uL (ref 0.7–4.0)
MCH: 29.9 pg (ref 26.0–34.0)
MCHC: 35.2 g/dL (ref 30.0–36.0)
MCV: 85 fL (ref 80.0–100.0)
Monocytes Absolute: 0.5 10*3/uL (ref 0.1–1.0)
Monocytes Relative: 7 %
Neutro Abs: 4.7 10*3/uL (ref 1.7–7.7)
Neutrophils Relative %: 64 %
Platelets: 250 10*3/uL (ref 150–400)
RBC: 4.41 MIL/uL (ref 3.87–5.11)
RDW: 16.5 % — ABNORMAL HIGH (ref 11.5–15.5)
WBC: 7.3 10*3/uL (ref 4.0–10.5)
nRBC: 0 % (ref 0.0–0.2)

## 2021-07-31 MED ORDER — METOCLOPRAMIDE HCL 5 MG/ML IJ SOLN
5.0000 mg | Freq: Once | INTRAMUSCULAR | Status: AC
  Administered 2021-07-31: 5 mg via INTRAVENOUS
  Filled 2021-07-31: qty 2

## 2021-07-31 MED ORDER — DIPHENHYDRAMINE HCL 50 MG/ML IJ SOLN
12.5000 mg | Freq: Once | INTRAMUSCULAR | Status: AC
  Administered 2021-07-31: 12.5 mg via INTRAVENOUS
  Filled 2021-07-31: qty 1

## 2021-07-31 MED ORDER — LACTATED RINGERS IV SOLN: INTRAVENOUS | Status: DC

## 2021-07-31 NOTE — ED Triage Notes (Signed)
Pt states she is having numbness to hands and toes, R worse than L, and blurred vision to bilateral eyes. Pt states she has recent hx stroke

## 2021-08-01 ENCOUNTER — Other Ambulatory Visit: Payer: Self-pay | Admitting: *Deleted

## 2021-08-01 NOTE — Patient Outreach (Addendum)
[facility]) Care Management  08/01/2021  Stephanie [name] [facility] 10-28-1970 [id number]   Noted that member was seen in the ED yesterday for complaints of had numbness and headache.  Per notes, no evidence of recurrent stroke, no interventions needed for member to return back to baseline.    Call placed to member, state she is well, denies any complaints today.  Report she has gone back to work, still trying to figure out taking medications around work schedule.  Reminded to monitor blood pressure and drink plenty of fluids when taking.  She verbalizes understanding.  Missed appointment on 6/19 with new PCP, provided with contact information to call to reschedule.  Denies any urgent concerns, encouraged to contact this care manager with questions.  Will close case at this time, no further needs identified.  Kemper Durie, RN, MSN, CCM [facility] Care Management  [facility] Manager [phone]

## 2021-08-03 ENCOUNTER — Encounter: Payer: Self-pay | Admitting: Neurology

## 2021-08-04 ENCOUNTER — Ambulatory Visit: Payer: Self-pay | Admitting: *Deleted

## 2021-09-25 ENCOUNTER — Encounter (HOSPITAL_COMMUNITY): Payer: Self-pay

## 2021-09-25 ENCOUNTER — Emergency Department (HOSPITAL_COMMUNITY)
Admission: EM | Admit: 2021-09-25 | Discharge: 2021-09-25 | Disposition: A | Discharge status: Home / Self Care | Payer: Self-pay | Attending: Emergency Medicine | Admitting: Emergency Medicine

## 2021-09-25 ENCOUNTER — Emergency Department (HOSPITAL_COMMUNITY): Payer: Self-pay

## 2021-09-25 ENCOUNTER — Emergency Department (HOSPITAL_BASED_OUTPATIENT_CLINIC_OR_DEPARTMENT_OTHER): Payer: Self-pay

## 2021-09-25 ENCOUNTER — Other Ambulatory Visit: Payer: Self-pay

## 2021-09-25 DIAGNOSIS — I10 Essential (primary) hypertension: Secondary | ICD-10-CM | POA: Insufficient documentation

## 2021-09-25 DIAGNOSIS — E119 Type 2 diabetes mellitus without complications: Secondary | ICD-10-CM | POA: Insufficient documentation

## 2021-09-25 DIAGNOSIS — M79661 Pain in right lower leg: Secondary | ICD-10-CM | POA: Insufficient documentation

## 2021-09-25 DIAGNOSIS — Z79899 Other long term (current) drug therapy: Secondary | ICD-10-CM | POA: Insufficient documentation

## 2021-09-25 DIAGNOSIS — M79674 Pain in right toe(s): Secondary | ICD-10-CM | POA: Insufficient documentation

## 2021-09-25 DIAGNOSIS — M79604 Pain in right leg: Secondary | ICD-10-CM

## 2021-09-25 DIAGNOSIS — Z7984 Long term (current) use of oral hypoglycemic drugs: Secondary | ICD-10-CM | POA: Insufficient documentation

## 2021-09-25 DIAGNOSIS — M79609 Pain in unspecified limb: Secondary | ICD-10-CM

## 2021-09-25 NOTE — Discharge Instructions (Signed)
You were seen today for right leg and toe pain.  I recommend putting ice over the affected area of your toe and leg to help with inflammation.  Use Tylenol as needed for pain.  You may elevate the affected extremity to help with swelling if any develops.  No fracture or blood clot was noted on your work-up this morning.  Return to the emergency room if you develop any life-threatening condition such as chest pain or shortness of breath

## 2021-09-25 NOTE — ED Provider Notes (Signed)
[facility]-EMERGENCY DEPT Provider Note   CSN: [id number] Arrival date & time: 09/25/21  0720     History  Chief Complaint  Patient presents with   Leg Pain    Stephanie [name] is a 51 y.o. female.  Patient presents to the hospital complaining of right-sided great toe pain secondary to dropping a trash can on her toe.  She states she slipped when carrying a trash can a few days ago and dropped a trash can.  She also complains of right calf tenderness which has been ongoing for the past 2 days.  She was admitted to the hospital in June of this year due to an ischemic stroke.  She is hypertensive at this time and has not been taking her daily medications due to being out of them.  Patient states she has a follow-up scheduled with primary care but not until October.  She denies shortness of breath, chest pain, abdominal pain.  She denies any known cancer history, recent surgery, or recent travel.  Past medical history significant for hypertension, high cholesterol, type 2 diabetes with diabetic neuropathy, history of polysubstance abuse  HPI     Home Medications Prior to Admission medications   Medication Sig Start Date End Date Taking? Authorizing Provider  albuterol (VENTOLIN HFA) 108 (90 Base) MCG/ACT inhaler Inhale 2 puffs into the lungs every 6 (six) hours as needed for wheezing or shortness of breath. 03/25/20   Arvilla Market, MD  amLODipine (NORVASC) 10 MG tablet Take 1 tablet (10 mg total) by mouth daily. 07/13/21 07/13/22  Lewie Chamber, MD  atorvastatin (LIPITOR) 40 MG tablet Take 1 tablet (40 mg total) by mouth daily. 07/14/21   Lewie Chamber, MD  carvedilol (COREG) 12.5 MG tablet Take 1 tablet (12.5 mg total) by mouth 2 (two) times daily with a meal. 07/13/21 07/13/22  Lewie Chamber, MD  gabapentin (NEURONTIN) 300 MG capsule Take 1 capsule (300 mg total) by mouth 2 (two) times daily. 07/13/21   Lewie Chamber, MD  glipiZIDE (GLUCOTROL) 5 MG tablet Take  2 tablets (10 mg total) by mouth 2 (two) times daily before a meal. 07/13/21 07/13/22  Lewie Chamber, MD  losartan (COZAAR) 25 MG tablet Take 3 tablets (75 mg total) by mouth daily. 07/14/21   Lewie Chamber, MD  metFORMIN (GLUCOPHAGE) 1000 MG tablet Take 1 tablet (1,000 mg total) by mouth 2 (two) times daily with a meal. 07/13/21 07/13/22  Lewie Chamber, MD  TRUE METRIX BLOOD GLUCOSE TEST test strip Use as instructed. Check blood glucose level by fingerstick twice per day. 10/13/18   Claiborne Rigg, NP  TRUEplus Lancets 28G MISC Use as instructed. Check blood glucose level by fingerstick twice per day. 10/13/18   Claiborne Rigg, NP      Allergies    Lisinopril    Review of Systems   Review of Systems  Cardiovascular:  Negative for leg swelling.  Musculoskeletal:  Positive for arthralgias.    Physical Exam Updated Vital Signs BP (!) 179/133   Pulse 85   Temp 98.6 F (37 C) (Oral)   Resp 18   SpO2 100%  Physical Exam Vitals and nursing note reviewed.  Constitutional:      General: She is not in acute distress. HENT:     Head: Normocephalic and atraumatic.     Mouth/Throat:     Mouth: Mucous membranes are moist.  Eyes:     Conjunctiva/sclera: Conjunctivae normal.  Cardiovascular:     Rate and  Rhythm: Normal rate and regular rhythm.     Pulses: Normal pulses.     Heart sounds: Normal heart sounds.  Pulmonary:     Effort: Pulmonary effort is normal.     Breath sounds: Normal breath sounds.  Musculoskeletal:        General: Tenderness (Generalized tenderness to palpation of the great toe of the right foot, significant tenderness to palpation of the posterior right lower leg.) present. No swelling (No swelling noted to the right lower extremity). Normal range of motion.     Cervical back: Normal range of motion and neck supple.  Skin:    General: Skin is warm and dry.     Capillary Refill: Capillary refill takes less than 2 seconds.  Neurological:     Mental Status: She is alert and  oriented to person, place, and time.     ED Results / Procedures / Treatments   Labs (all labs ordered are listed, but only abnormal results are displayed) Labs Reviewed - No data to display  EKG None  Radiology VAS [place] LOWER EXTREMITY VENOUS (DVT) (7a-7p)  Result Date: 09/25/2021  Lower Venous DVT Study Patient Name:  Stephanie [name] [facility]  Date of Exam:   09/25/2021 Medical Rec #: [id number]            Accession #:    [id number] Date of Birth: 11-20-1970            Patient Gender: F Patient Age:   19 years Exam Location[facility]  [facility] Procedure:      VAS [place] LOWER EXTREMITY VENOUS (DVT) Referring Phys: Barrie Dunker --------------------------------------------------------------------------------  Indications: Dropped a trashcan on toe and now there is pain in toe and calf.  Comparison Study: No previous exams Performing Technologist: Jody Hill RVT, RDMS  Examination Guidelines: A complete evaluation includes B-mode imaging, spectral Doppler, color Doppler, and power Doppler as needed of all accessible portions of each vessel. Bilateral testing is considered an integral part of a complete examination. Limited examinations for reoccurring indications may be performed as noted. The reflux portion of the exam is performed with the patient in reverse Trendelenburg.  +---------+---------------+---------+-----------+----------+--------------+ RIGHT    CompressibilityPhasicitySpontaneityPropertiesThrombus Aging +---------+---------------+---------+-----------+----------+--------------+ CFV      Full           Yes      Yes                                 +---------+---------------+---------+-----------+----------+--------------+ SFJ      Full                                                        +---------+---------------+---------+-----------+----------+--------------+ FV Prox  Full           Yes      Yes                                  +---------+---------------+---------+-----------+----------+--------------+ FV Mid   Full           Yes      Yes                                 +---------+---------------+---------+-----------+----------+--------------+  FV DistalFull           Yes      Yes                                 +---------+---------------+---------+-----------+----------+--------------+ PFV      Full                                                        +---------+---------------+---------+-----------+----------+--------------+ POP      Full           Yes      Yes                                 +---------+---------------+---------+-----------+----------+--------------+ PTV      Full                                                        +---------+---------------+---------+-----------+----------+--------------+ PERO     Full                                                        +---------+---------------+---------+-----------+----------+--------------+   +----+---------------+---------+-----------+----------+--------------+ LEFTCompressibilityPhasicitySpontaneityPropertiesThrombus Aging +----+---------------+---------+-----------+----------+--------------+ CFV Full           Yes      Yes                                 +----+---------------+---------+-----------+----------+--------------+    Summary: RIGHT: - There is no evidence of deep vein thrombosis in the lower extremity.  - No cystic structure found in the popliteal fossa.  LEFT: - No evidence of common femoral vein obstruction.  *See table(s) above for measurements and observations.    Preliminary    DG Toe Great Right  Result Date: 09/25/2021 CLINICAL DATA:  Pain after dropping a heavy item on toe EXAM: RIGHT GREAT TOE COMPARISON:  None Available. FINDINGS: There is no evidence of fracture or dislocation. There is no evidence of arthropathy or other focal bone abnormality. Mild soft tissue swelling. IMPRESSION: Mild  soft tissue swelling.  No fracture seen. Electronically Signed   By: Marjo Bicker M.D.   On: 09/25/2021 08:02    Procedures Procedures    Medications Ordered in ED Medications - No data to display  ED Course/ Medical Decision Making/ A&P                           Medical Decision Making Amount and/or Complexity of Data Reviewed Radiology: ordered.   This patient presents to the ED for concern of right lower leg pain, this involves an extensive number of treatment options, and is a complaint that carries with it a high risk of complications and morbidity.  The differential diagnosis includes DVT, muscle strain, fracture, and others.  Patient also complains of right  great toe pain.  Differential includes fracture, dislocation, soft tissue injury, and others   Co morbidities that complicate the patient evaluation  History of hypertension, previous stroke   Additional history obtained:  External records from outside source obtained and reviewed including discharge summary from June of this year after admission for ischemic stroke    Imaging Studies ordered:  I ordered imaging studies including plain films of the great toe of the right foot, DVT study of the right lower extremity I independently visualized and interpreted imaging which showed no fracture or dislocation in the toe.  Soft tissue swelling.  Ultrasound DVT study shows no signs of DVT I agree with the radiologist interpretation   Social Determinants of Health:  Patient has no health insurance and difficulty affording medications   Test / Admission - Considered:  The patient has no fracture or dislocation of the great toe.  No sign of DVT in right lower extremity.  There is no indication at this time for admission.  The patient's pain seems musculoskeletal in nature.  No sign of fracture or deformity in the lower right extremity.  She may discharge home at this time and use Tylenol as needed for pain.  She may  apply ice over the affected area.  The patient did inquire about refills of her daily medications but chart review shows that she has refills available at the [facility] health pharmacy at [facility] and at the [facility] transitions of care pharmacy.  Patient made aware.  Discharge home.        Final Clinical Impression(s) / ED Diagnoses Final diagnoses:  Right leg pain  Great toe pain, right    Rx / DC Orders ED Discharge Orders     None         Pamala Duffel 09/25/21 1156    Margarita Grizzle, MD 09/28/21 [phone]

## 2021-09-25 NOTE — Progress Notes (Signed)
RLE venous duplex has been completed.  Preliminary results given to Dr. Rosalia Hammers.   Results can be found under chart review under CV PROC. 09/25/2021 10:50 AM Autumm Hattery RVT, RDMS

## 2021-09-25 NOTE — ED Triage Notes (Signed)
Pt reports dropping a trash can on her toe a few days ago and now has right toe pain and right leg pain and weakness. Pt is hypertensive in triage and reports not taking BP medication today and running out of home medications.

## 2021-09-26 ENCOUNTER — Other Ambulatory Visit: Payer: Self-pay

## 2021-09-27 ENCOUNTER — Ambulatory Visit: Payer: Self-pay | Admitting: Neurology

## 2021-11-16 ENCOUNTER — Other Ambulatory Visit: Payer: Self-pay

## 2021-11-16 ENCOUNTER — Ambulatory Visit (INDEPENDENT_AMBULATORY_CARE_PROVIDER_SITE_OTHER): Payer: Self-pay | Admitting: Primary Care

## 2021-11-16 ENCOUNTER — Encounter (INDEPENDENT_AMBULATORY_CARE_PROVIDER_SITE_OTHER): Payer: Self-pay | Admitting: Primary Care

## 2021-11-16 VITALS — BP 182/113 | HR 85 | Ht 59.0 in | Wt 123.0 lb

## 2021-11-16 DIAGNOSIS — F4323 Adjustment disorder with mixed anxiety and depressed mood: Secondary | ICD-10-CM

## 2021-11-16 DIAGNOSIS — E1140 Type 2 diabetes mellitus with diabetic neuropathy, unspecified: Secondary | ICD-10-CM

## 2021-11-16 DIAGNOSIS — Z7689 Persons encountering health services in other specified circumstances: Secondary | ICD-10-CM

## 2021-11-16 DIAGNOSIS — I10 Essential (primary) hypertension: Secondary | ICD-10-CM

## 2021-11-16 DIAGNOSIS — Z1211 Encounter for screening for malignant neoplasm of colon: Secondary | ICD-10-CM

## 2021-11-16 LAB — POCT GLYCOSYLATED HEMOGLOBIN (HGB A1C): HbA1c, POC (controlled diabetic range): 6.5 % (ref 0.0–7.0)

## 2021-11-16 LAB — GLUCOSE, POCT (MANUAL RESULT ENTRY): POC Glucose: 194 mg/dl — AB (ref 70–99)

## 2021-11-16 MED ORDER — VALSARTAN-HYDROCHLOROTHIAZIDE 160-25 MG PO TABS
1.0000 | ORAL_TABLET | Freq: Every day | ORAL | 1 refills | Status: DC
  Filled 2021-11-16: qty 30, 30d supply, fill #0

## 2021-11-16 MED ORDER — METFORMIN HCL ER (OSM) 500 MG PO TB24
500.0000 mg | ORAL_TABLET | Freq: Every day | ORAL | 1 refills | Status: DC
  Filled 2021-11-16: qty 90, 90d supply, fill #0

## 2021-11-16 MED ORDER — METFORMIN HCL 500 MG PO TABS
500.0000 mg | ORAL_TABLET | Freq: Two times a day (BID) | ORAL | 1 refills | Status: DC
  Filled 2021-11-16: qty 180, 90d supply, fill #0

## 2021-11-16 NOTE — Progress Notes (Signed)
[facility]   Subjective:   [name] is a 51 y.o. normal weight  female who presents to establish care.  She voices no complaints or concerns however her blood pressure is extremely elevated Patient has No headache, No chest pain, No abdominal pain - No Nausea, No new weakness tingling or numbness, No Cough - shortness of breath.  She denies polyuria ,polyphagia, polydipsia or vision changes.  She did remember to show writer a knot/mass on her right thigh laterally.  It is tender to touch it has been present for about a week now denies any trauma unclear where this came from.  Allergies  Allergen Reactions   Lisinopril Cough      Current Outpatient Medications on File Prior to Visit  Medication Sig Dispense Refill   albuterol (VENTOLIN HFA) 108 (90 Base) MCG/ACT inhaler Inhale 2 puffs into the lungs every 6 (six) hours as needed for wheezing or shortness of breath. 18 g 1   amLODipine (NORVASC) 10 MG tablet Take 1 tablet (10 mg total) by mouth daily. 30 tablet 3   atorvastatin (LIPITOR) 40 MG tablet Take 1 tablet (40 mg total) by mouth daily. 30 tablet 3   carvedilol (COREG) 12.5 MG tablet Take 1 tablet (12.5 mg total) by mouth 2 (two) times daily with a meal. 60 tablet 3   gabapentin (NEURONTIN) 300 MG capsule Take 1 capsule (300 mg total) by mouth 2 (two) times daily. 60 capsule 3   glipiZIDE (GLUCOTROL) 5 MG tablet Take 2 tablets (10 mg total) by mouth 2 (two) times daily before a meal. 90 tablet 3   losartan (COZAAR) 25 MG tablet Take 3 tablets (75 mg total) by mouth daily. 90 tablet 3   metFORMIN (GLUCOPHAGE) 1000 MG tablet Take 1 tablet (1,000 mg total) by mouth 2 (two) times daily with a meal. 90 tablet 3   TRUE METRIX BLOOD GLUCOSE TEST test strip Use as instructed. Check blood glucose level by fingerstick twice per day. 100 each 1   TRUEplus Lancets 28G MISC Use as instructed. Check blood glucose level by fingerstick twice per day. 100 each 2   No  current facility-administered medications on file prior to visit.   Review of System: Comprehensive ROS Pertinent positive and negative noted in HPI    Objective:  BP (!) 182/113   Pulse 85   Ht 4\' 11"  (1.499 m)   Wt 123 lb (55.8 kg)   SpO2 100%   BMI 24.84 kg/m   Filed Weights   11/16/21 1403  Weight: 123 lb (55.8 kg)    Physical Exam: General Appearance: Well nourished, in no apparent distress. Eyes: PERRLA, EOMs, conjunctiva no swelling or erythema Sinuses: No Frontal/maxillary tenderness ENT/Mouth: Ext aud canals clear, TMs without erythema, bulging. No erythema, swelling, or exudate on post pharynx. Hearing normal.  Neck: Supple, thyroid normal.  Respiratory: Respiratory effort normal, BS equal bilaterally without rales, rhonchi, wheezing or stridor.  Cardio: RRR with no MRGs. Brisk peripheral pulses without edema.  Abdomen: Soft, + BS.  Non tender, no guarding, rebound, hernias, masses. Lymphatics: Non tender without lymphadenopathy.  Musculoskeletal: Full ROM, 5/5 strength, normal gait.  Skin: Warm, dry without rashes, lesions, ecchymosis.  Neuro: Cranial nerves intact. Normal muscle tone, no cerebellar symptoms. Sensation intact.  Psych: Awake and oriented X 3, normal affect, Insight and Judgment appropriate.    Assessment:  [name] was seen today for new patient (initial visit).  Diagnoses and all orders for this visit:  Type 2  diabetes mellitus with diabetic neuropathy, without long-term current use of insulin ([place]) educated on lifestyle modifications, including but not limited to diet choices and adding exercise to daily routine.  -     POCT glucose (manual entry) -     POCT glycosylated hemoglobin (Hb A1C) 6.5 Change medication discontinue metformin at 1000 mg twice daily and glipizide 5 mg twice daily.  She will start on metformin XR 500 mg daily  Essential hypertension BP goal - < 130/80 Blood pressure medication changes although patient admitted to  having some pork skins prior to appointment.  Discontinued losartan will add valsartan/HCTZ 160/25 and amlodipine 10 mg both taken daily Explained that having normal blood pressure is the goal and medications are helping to get to goal and maintain normal blood pressure. DIET: Limit salt intake, read nutrition labels to check salt content, limit fried and high fatty foods  Avoid using multisymptom OTC cold preparations that generally contain sudafed which can rise BP. Consult with pharmacist on best cold relief products to use for persons with HTN EXERCISE Discussed incorporating exercise such as walking - 30 minutes most days of the week and can do in 10 minute intervals    -     valsartan-hydrochlorothiazide (DIOVAN-HCT) 160-25 MG tablet; Take 1 tablet by mouth daily.  Adjustment disorder with mixed anxiety and depressed mood She will follow-up with clinical social worker  [place] Office Visit from 11/16/2021 in [place]  PHQ-2 Total Score 6       Encounter to establish care Establish care with PCP Other orders -     metformin (FORTAMET) 500 MG (OSM) 24 hr tablet; Take 1 tablet (500 mg total) by mouth daily with breakfast.      No orders of the defined types were placed in this encounter.   This note has been created with Surveyor, quantity. Any transcriptional errors are unintentional.   Kerin Perna, NP 11/16/2021, 2:22 PM

## 2021-11-16 NOTE — Patient Instructions (Signed)
Hypertension, Adult High blood pressure (hypertension) is when the force of blood pumping through the arteries is too strong. The arteries are the blood vessels that carry blood from the heart throughout the body. Hypertension forces the heart to work harder to pump blood and may cause arteries to become narrow or stiff. Untreated or uncontrolled hypertension can lead to a heart attack, heart failure, a stroke, kidney disease, and other problems. A blood pressure reading consists of a higher number over a lower number. Ideally, your blood pressure should be below 120/80. The first ("top") number is called the systolic pressure. It is a measure of the pressure in your arteries as your heart beats. The second ("bottom") number is called the diastolic pressure. It is a measure of the pressure in your arteries as the heart relaxes. What are the causes? The exact cause of this condition is not known. There are some conditions that result in high blood pressure. What increases the risk? Certain factors may make you more likely to develop high blood pressure. Some of these risk factors are under your control, including: Smoking. Not getting enough exercise or physical activity. Being overweight. Having too much fat, sugar, calories, or salt (sodium) in your diet. Drinking too much alcohol. Other risk factors include: Having a personal history of heart disease, diabetes, high cholesterol, or kidney disease. Stress. Having a family history of high blood pressure and high cholesterol. Having obstructive sleep apnea. Age. The risk increases with age. What are the signs or symptoms? High blood pressure may not cause symptoms. Very high blood pressure (hypertensive crisis) may cause: Headache. Fast or irregular heartbeats (palpitations). Shortness of breath. Nosebleed. Nausea and vomiting. Vision changes. Severe chest pain, dizziness, and seizures. How is this diagnosed? This condition is diagnosed by  measuring your blood pressure while you are seated, with your arm resting on a flat surface, your legs uncrossed, and your feet flat on the floor. The cuff of the blood pressure monitor will be placed directly against the skin of your upper arm at the level of your heart. Blood pressure should be measured at least twice using the same arm. Certain conditions can cause a difference in blood pressure between your right and left arms. If you have a high blood pressure reading during one visit or you have normal blood pressure with other risk factors, you may be asked to: Return on a different day to have your blood pressure checked again. Monitor your blood pressure at home for 1 week or longer. If you are diagnosed with hypertension, you may have other blood or imaging tests to help your health care provider understand your overall risk for other conditions. How is this treated? This condition is treated by making healthy lifestyle changes, such as eating healthy foods, exercising more, and reducing your alcohol intake. You may be referred for counseling on a healthy diet and physical activity. Your health care provider may prescribe medicine if lifestyle changes are not enough to get your blood pressure under control and if: Your systolic blood pressure is above 130. Your diastolic blood pressure is above 80. Your personal target blood pressure may vary depending on your medical conditions, your age, and other factors. Follow these instructions at home: Eating and drinking  Eat a diet that is high in fiber and potassium, and low in sodium, added sugar, and fat. An example of this eating plan is called the DASH diet. DASH stands for Dietary Approaches to Stop Hypertension. To eat this way: Eat   plenty of fresh fruits and vegetables. Try to fill one half of your plate at each meal with fruits and vegetables. Eat whole grains, such as whole-wheat pasta, brown rice, or whole-grain bread. Fill about one  fourth of your plate with whole grains. Eat or drink low-fat dairy products, such as skim milk or low-fat yogurt. Avoid fatty cuts of meat, processed or cured meats, and poultry with skin. Fill about one fourth of your plate with lean proteins, such as fish, chicken without skin, beans, eggs, or tofu. Avoid pre-made and processed foods. These tend to be higher in sodium, added sugar, and fat. Reduce your daily sodium intake. Many people with hypertension should eat less than 1,500 mg of sodium a day. Do not drink alcohol if: Your health care provider tells you not to drink. You are pregnant, may be pregnant, or are planning to become pregnant. If you drink alcohol: Limit how much you have to: 0-1 drink a day for women. 0-2 drinks a day for men. Know how much alcohol is in your drink. In the [place] one drink equals one 12 oz bottle of beer (355 mL), one 5 oz glass of wine (148 mL), or one 1 oz glass of hard liquor (44 mL). Lifestyle  Work with your health care provider to maintain a healthy body weight or to lose weight. Ask what an ideal weight is for you. Get at least 30 minutes of exercise that causes your heart to beat faster (aerobic exercise) most days of the week. Activities may include walking, swimming, or biking. Include exercise to strengthen your muscles (resistance exercise), such as Pilates or lifting weights, as part of your weekly exercise routine. Try to do these types of exercises for 30 minutes at least 3 days a week. Do not use any products that contain nicotine or tobacco. These products include cigarettes, chewing tobacco, and vaping devices, such as e-cigarettes. If you need help quitting, ask your health care provider. Monitor your blood pressure at home as told by your health care provider. Keep all follow-up visits. This is important. Medicines Take over-the-counter and prescription medicines only as told by your health care provider. Follow directions carefully. Blood  pressure medicines must be taken as prescribed. Do not skip doses of blood pressure medicine. Doing this puts you at risk for problems and can make the medicine less effective. Ask your health care provider about side effects or reactions to medicines that you should watch for. Contact a health care provider if you: Think you are having a reaction to a medicine you are taking. Have headaches that keep coming back (recurring). Feel dizzy. Have swelling in your ankles. Have trouble with your vision. Get help right away if you: Develop a severe headache or confusion. Have unusual weakness or numbness. Feel faint. Have severe pain in your chest or abdomen. Vomit repeatedly. Have trouble breathing. These symptoms may be an emergency. Get help right away. Call 911. Do not wait to see if the symptoms will go away. Do not drive yourself to the hospital. Summary Hypertension is when the force of blood pumping through your arteries is too strong. If this condition is not controlled, it may put you at risk for serious complications. Your personal target blood pressure may vary depending on your medical conditions, your age, and other factors. For most people, a normal blood pressure is less than 120/80. Hypertension is treated with lifestyle changes, medicines, or a combination of both. Lifestyle changes include losing weight, eating a healthy,   low-sodium diet, exercising more, and limiting alcohol. This information is not intended to replace advice given to you by your health care provider. Make sure you discuss any questions you have with your health care provider. Document Revised: 11/29/2020 Document Reviewed: 11/29/2020 Elsevier Patient Education  2023 Elsevier Inc.  

## 2021-11-22 ENCOUNTER — Other Ambulatory Visit: Payer: Self-pay

## 2021-11-22 DIAGNOSIS — Z1231 Encounter for screening mammogram for malignant neoplasm of breast: Secondary | ICD-10-CM

## 2021-11-23 ENCOUNTER — Other Ambulatory Visit: Payer: Self-pay

## 2021-12-18 ENCOUNTER — Telehealth (INDEPENDENT_AMBULATORY_CARE_PROVIDER_SITE_OTHER): Payer: Self-pay | Admitting: Primary Care

## 2021-12-18 ENCOUNTER — Other Ambulatory Visit: Payer: Self-pay

## 2021-12-18 ENCOUNTER — Encounter (INDEPENDENT_AMBULATORY_CARE_PROVIDER_SITE_OTHER): Payer: Self-pay | Admitting: Primary Care

## 2021-12-18 ENCOUNTER — Telehealth: Payer: Self-pay

## 2021-12-18 ENCOUNTER — Ambulatory Visit (INDEPENDENT_AMBULATORY_CARE_PROVIDER_SITE_OTHER): Payer: Self-pay | Admitting: Primary Care

## 2021-12-18 VITALS — BP 132/89 | HR 98 | Resp 16 | Ht 59.0 in | Wt 114.6 lb

## 2021-12-18 DIAGNOSIS — Z1211 Encounter for screening for malignant neoplasm of colon: Secondary | ICD-10-CM

## 2021-12-18 DIAGNOSIS — I10 Essential (primary) hypertension: Secondary | ICD-10-CM

## 2021-12-18 DIAGNOSIS — F418 Other specified anxiety disorders: Secondary | ICD-10-CM

## 2021-12-18 DIAGNOSIS — F1910 Other psychoactive substance abuse, uncomplicated: Secondary | ICD-10-CM

## 2021-12-18 DIAGNOSIS — E1159 Type 2 diabetes mellitus with other circulatory complications: Secondary | ICD-10-CM

## 2021-12-18 MED ORDER — VALSARTAN-HYDROCHLOROTHIAZIDE 160-25 MG PO TABS
1.0000 | ORAL_TABLET | Freq: Every day | ORAL | 1 refills | Status: DC
  Filled 2021-12-18: qty 90, 90d supply, fill #0

## 2021-12-18 MED ORDER — ESCITALOPRAM OXALATE 10 MG PO TABS
10.0000 mg | ORAL_TABLET | Freq: Every day | ORAL | 0 refills | Status: DC
  Filled 2021-12-18: qty 90, 90d supply, fill #0

## 2021-12-18 MED ORDER — METFORMIN HCL 500 MG PO TABS
500.0000 mg | ORAL_TABLET | Freq: Two times a day (BID) | ORAL | 1 refills | Status: DC
  Filled 2021-12-18: qty 180, 90d supply, fill #0

## 2021-12-18 NOTE — Telephone Encounter (Signed)
I spoke to the patient when she was with PCP at her appointment this morning.  She explained that she is very interested in treatment for her substance use and would like information about residential treatment. I explained that she will need to detox prior to attending a residential program.   I provided her with the phone number for [facility] residential treatment center/ [place] and instructed her to call and explain her needs. They can provide more information about detox and let her know if there are any female beds available and if not, when they might be available.

## 2021-12-18 NOTE — Progress Notes (Signed)
[facility]  Stephanie [name], is a 51 y.o. female  [id number]  [id number]  DOB - 09-05-1970  Chief Complaint  Patient presents with   Blood Pressure Check   Constipation       Subjective:   [name] is a 51 y.o. female here today for a follow up visit for hypertension and constipation.  Patient has No headache, No chest pain, No abdominal pain - No Nausea, No new weakness tingling or numbness, No Cough - shortness of breath.  However, patient started crying and stated how depressed she was not wanting to get out of the bed, not wanting to do anything but sit around and cry all day.  Explained sometimes people go through seasonal adjustment disorder time changes is lighter in the day and he gets dark quicker at night so the activities for daylight evening time has decreased.  Then patient goes on to tell  writer this morning that she is using crack and cocaine and because I think she needed use last night.  Ask why are you going back to old habits then she admitted to using off and on over the last year.  She is at her breaking point she is no longer getting high it has just become a habit.  She denies suicidal homicidal thoughts no auditory or visual hallucinations . Contacted clinical case manager while patient was in the room.  She also, discussed problems and provided information for [facility] in [facility] for a female bed.  PCP and clinical Facilities manager. No problems updated.  Allergies  Allergen Reactions   Lisinopril Cough    Past Medical History:  Diagnosis Date   Diabetes mellitus without complication (HCC)    High cholesterol    Hypertension     Current Outpatient Medications on File Prior to Visit  Medication Sig Dispense Refill   albuterol (VENTOLIN HFA) 108 (90 Base) MCG/ACT inhaler Inhale 2 puffs into the lungs every 6 (six) hours as needed for wheezing or shortness of breath. 18 g 1   amLODipine (NORVASC) 10 MG tablet Take 1  tablet (10 mg total) by mouth daily. 30 tablet 3   atorvastatin (LIPITOR) 40 MG tablet Take 1 tablet (40 mg total) by mouth daily. 30 tablet 3   carvedilol (COREG) 12.5 MG tablet Take 1 tablet (12.5 mg total) by mouth 2 (two) times daily with a meal. 60 tablet 3   gabapentin (NEURONTIN) 300 MG capsule Take 1 capsule (300 mg total) by mouth 2 (two) times daily. 60 capsule 3   metFORMIN (GLUCOPHAGE) 500 MG tablet Take 1 tablet (500 mg total) by mouth 2 (two) times daily with a meal. 180 tablet 1   TRUE METRIX BLOOD GLUCOSE TEST test strip Use as instructed. Check blood glucose level by fingerstick twice per day. 100 each 1   TRUEplus Lancets 28G MISC Use as instructed. Check blood glucose level by fingerstick twice per day. 100 each 2   valsartan-hydrochlorothiazide (DIOVAN-HCT) 160-25 MG tablet Take 1 tablet by mouth daily. 90 tablet 1   No current facility-administered medications on file prior to visit.    Objective:   Vitals:   12/18/21 0913 12/18/21 0915  BP: (!) 142/100 132/89  Pulse: 98   Resp: 16   SpO2: 95%   Weight: 114 lb 9.6 oz (52 kg)   Height: 4\' 11"  (1.499 m)    Exam General appearance : Awake, alert, not in any distress. Speech Clear. Not toxic looking HEENT: Atraumatic and Normocephalic, pupils  equally reactive to light and accomodation Neck: Supple, no JVD. No cervical lymphadenopathy.  Chest: Good air entry bilaterally, no added sounds  CVS: S1 S2 regular, no murmurs.  Abdomen: Bowel sounds present, Non tender and not distended with no gaurding, rigidity or rebound. Extremities: B/L Lower Ext shows no edema, both legs are warm to touch Neurology: Awake alert, and oriented X 3, Non focal Skin: No Rash  Data Review Lab Results  Component Value Date   HGBA1C 6.5 11/16/2021   HGBA1C 7.9 (H) 07/12/2021   HGBA1C 7.8 (H) 06/15/2020    Assessment & Plan  Stephanie [name] was seen today for blood pressure check and constipation.  Diagnoses and all orders for this  visit:  1. Depression with anxiety Started to become worse with depression and anxiety approximately 2 to 3 weeks ago this also correlates with time changing shorter days - escitalopram (LEXAPRO) 10 MG tablet; Take 1 tablet (10 mg total) by mouth daily.  Dispense: 90 tablet; Refill: 1  2. Colon cancer screening Received to day while at her visit Health maintenance/preventive care - Fecal occult blood, imunochemical   3. Polysubstance abuse [facility])  Patient has agreed to call [facility] as for a female bed treatment may initially be 4 weeks.  However depending on evaluation can last up to 6 months.  Patient is made aware of this and she is in agreement this is what she needs to do and this is what she wants to.  If she has any problems she is a contact PCP or clinical nurse manager   4. Essential hypertension BP goal - < 130/80 Explained that having normal blood pressure is the goal and medications are helping to get to goal and maintain normal blood pressure. DIET: Limit salt intake, read nutrition labels to check salt content, limit fried and high fatty foods  Avoid using multisymptom OTC cold preparations that generally contain sudafed which can rise BP. Consult with pharmacist on best cold relief products to use for persons with HTN EXERCISE Discussed incorporating exercise such as walking - 30 minutes most days of the week and can do in 10 minute intervals    -     valsartan-hydrochlorothiazide (DIOVAN-HCT) 160-25 MG tablet; Take 1 tablet by mouth daily.  6. Type 2 diabetes mellitus with other circulatory complication, without long-term current use of insulin (HCC)  - educated on lifestyle modifications, including but not limited to diet choices and adding exercise to daily routine.    Patient have been counseled extensively about nutrition and exercise. Other issues discussed during this visit include: low cholesterol diet, weight control and daily exercise, foot care, annual eye  examinations at Ophthalmology, importance of adherence with medications and regular follow-up. We also discussed long term complications of uncontrolled diabetes and hypertension.   Patient is to call and schedule a follow-up appointment after released from rehab.  The patient was given clear instructions to go to ER or return to medical center if symptoms don't improve, worsen or new problems develop. The patient verbalized understanding. The patient was told to call to get lab results if they haven't heard anything in the next week.   This note has been created with Education officer, environmental. Any transcriptional errors are unintentional.   Grayce Sessions, NP 12/18/2021, 10:20 AM

## 2021-12-18 NOTE — Patient Instructions (Signed)
Seasonal Affective Disorder Seasonal affective disorder (SAD) is a type of depression. It is when you feel depressed at specific times of the year. SAD is most common during late fall and winter when the days are shorter and most people spend less time outdoors. This is why SAD is also known as the "winter blues." SAD occurs less commonly in the spring or summer, but if it does, it may be associated with overexposure to light or high pollen counts. SAD can be mild to severe, and it can interfere with work, school, relationships, and normal daily activities. What are the causes? The cause of this condition is not known. It may be related to changes in brain chemistry that are caused by having more or less exposure to daylight. What increases the risk? You are more likely to develop this condition if: You are female. You live far north or far south of the equator. These areas get less sunlight and have longer winter seasons. You have a personal history of depression or bipolar disorder. You have a family history of mental health conditions. What are the signs or symptoms? Symptoms of this condition include: Mood changes, such as: Feeling sad or teary. Having crying spells. Irritability. Feelings of guilt or worthlessness. Thinking about self-harm or attempting suicide. Physical changes, such as: Restlessness or loss of energy. Difficulty concentrating, remembering, or making decisions. A significant change in appetite or weight. Behavioral changes, such as: Trouble sleeping, or sleeping more than usual. Loss of interest in activities that you usually enjoy. Overeating or craving sweet foods. Avoiding social situations (social withdrawal), or feeling like "hibernating." Symptoms associated with the less common summer pattern of SAD include: Loss of appetite. Weight loss. Trouble sleeping. Episodes of violent behavior (in severe cases). How is this diagnosed? This condition is usually  diagnosed through an assessment with your health care provider. You will be asked about your moods, thoughts, and behaviors, and if you have noticed a seasonal pattern. You will also be asked about your medical history, any major life changes, and any medicines and substances that you use. You may have a physical exam and blood tests to rule out other possible causes of your symptoms. You may be referred to a mental health specialist for more evaluation. How is this treated? Treatment for this condition may include: Light therapy. This therapy involves sitting in front of a light source for 15-30 minutes every day. The light source may be: A light box designed specifically to treat SAD. A dawn simulator or sunrise clock. This is a timer-activated light source that copies the sunrise by slowly becoming brighter. This can help to activate your body's internal clock. Antidepressant medicine. Cognitive behavioral therapy (CBT). CBT is a form of talk therapy that helps to identify and change negative thoughts that are associated with SAD. Changes to your dietary, exercise, or sleeping habits. A healthy lifestyle may help to prevent or relieve symptoms. Follow these instructions at home: Medicines Take over-the-counter and prescription medicines only as told by your health care provider. If you are taking antidepressant medicines, ask your health care provider what side effects you should be aware of. Talk with your health care provider before you start taking any new prescription or over-the-counter medicines, herbs, or supplements. Lifestyle Eat a healthy diet that includes fruits and vegetables, whole grains, and lean proteins. Get plenty of sleep. To improve your sleep, make sure you: Keep your bedroom dark and cool. Go to sleep and wake up at about the same   time every day. Do not keep screens, such as a TV or smartphone, in your bedroom. Limit your screen time starting a few hours before  bedtime. Exercise regularly. Limit alcohol and caffeine as told by your health care provider. General instructions Attend CBT therapy sessions as directed. For fall and winter SAD only: Make your home and work environment as sunny or bright as possible. Open window blinds and move furniture closer to windows. Spend as much time outside as possible. Use light therapy for 15-30 minutes every day, or as often as directed. This should start in the early fall and continue until spring. If SAD happens during the summer, light treatment would not be recommended. Keep all follow-up visits. This is important. Where to find more information National Institute of Mental Health: www.nimh.nih.gov National Alliance on Mental Illness: www.nami.org Contact a health care provider if: Your symptoms do not get better or they get worse. You have trouble taking care of yourself. You are using drugs or alcohol to cope with your symptoms. You have side effects from medicines. Get help right away if: You have thoughts about hurting yourself or others. If you ever feel like you may hurt yourself or others, or have thoughts about taking your own life, get help right away. Go to your nearest emergency department or: Call your local emergency services (911 in the [place] Call a suicide crisis helpline, such as the National Suicide Prevention Lifeline at 1-800-273-8255 or 988 in the [place] This is open 24 hours a day in the [place] Text the Crisis Text Line at 741741 (in the [place] Summary Seasonal affective disorder (SAD) is a type of depression that is associated with specific times of the year (usually fall and winter). This condition may be treated with light therapy, talk therapy, and antidepressant medicines. To help treat your condition, take good care of yourself and keep all follow-up visits with your health care provider. Get help right away if you have thoughts about hurting yourself or others. This information  is not intended to replace advice given to you by your health care provider. Make sure you discuss any questions you have with your health care provider. Document Revised: 08/17/2020 Document Reviewed: 05/12/2020 Elsevier Patient Education  2023 Elsevier Inc.  

## 2021-12-19 ENCOUNTER — Ambulatory Visit (INDEPENDENT_AMBULATORY_CARE_PROVIDER_SITE_OTHER): Payer: Self-pay | Admitting: *Deleted

## 2021-12-19 NOTE — Telephone Encounter (Signed)
  Chief Complaint: dizziness- had to leave work- almost fainted Symptoms: dizziness, fatigue Frequency: started today Pertinent Negatives: Patient denies chest pain Disposition: [] ED /[x] Urgent Care (no appt availability in office) / [] Appointment(In office/virtual)/ []  [facility]/ [] Home Care/ [] Refused Recommended Disposition /[] [facility]/ []  Follow-up with PCP Additional Notes: Patient states she took her medications before work today- she states she normally does not take her medications on mornings she works.( Provider is not aware- patient states she is supposed to take am/pm- but only takes PM on work days- advised she needs to make provider aware) Patient is also taking glipizide which is not on list- reviewed current medication list with patient. Advised patient to go to UC to be evaluated for dizziness- not sure she will go- transportation.

## 2021-12-19 NOTE — Telephone Encounter (Signed)
LCSWA called patient today to introduce herself and to assess patients' mental health needs. Patient did not answer the phone. LCSWA was not able to leave a message. Patient was referred by PCP for depression.

## 2021-12-19 NOTE — Telephone Encounter (Signed)
Reason for Disposition  [1] MODERATE dizziness (e.g., interferes with normal activities) AND [2] has NOT been evaluated by doctor (or NP/PA) for this  (Exception: Dizziness caused by heat exposure, sudden standing, or poor fluid intake.)  Answer Assessment - Initial Assessment Questions 1. DESCRIPTION: "Describe your dizziness."     Dizziness- felt faint 2. LIGHTHEADED: "Do you feel lightheaded?" (e.g., somewhat faint, woozy, weak upon standing)     Laying down now 3. VERTIGO: "Do you feel like either you or the room is spinning or tilting?" (i.e. vertigo)      na 4. SEVERITY: "How bad is it?"  "Do you feel like you are going to faint?" "Can you stand and walk?"   - MILD: Feels slightly dizzy, but walking normally.   - MODERATE: Feels unsteady when walking, but not falling; interferes with normal activities (e.g., school, work).   - SEVERE: Unable to walk without falling, or requires assistance to walk without falling; feels like passing out now.      Moderate 5. ONSET:  "When did the dizziness begin?"     This morning 6. AGGRAVATING FACTORS: "Does anything make it worse?" (e.g., standing, change in head position)     Took medication- doesn't take medication before work- glipizide, metformin, atorvastatin, losartan, amlodipine, gabapentin , carvidol 7. HEART RATE: "Can you tell me your heart rate?" "How many beats in 15 seconds?"  (Note: not all patients can do this)        unsure 8. CAUSE: "What do you think is causing the dizziness?"     Patient has not been taking her meds on morning when she works 9. RECURRENT SYMPTOM: "Have you had dizziness before?" If Yes, ask: "When was the last time?" "What happened that time?"     Yes- hx storke 10. OTHER SYMPTOMS: "Do you have any other symptoms?" (e.g., fever, chest pain, vomiting, diarrhea, bleeding)       fatigue 11. PREGNANCY: "Is there any chance you are pregnant?" "When was your last menstrual period?"       na  Protocols used:  Dizziness - Lightheadedness-A-AH

## 2021-12-20 ENCOUNTER — Telehealth (INDEPENDENT_AMBULATORY_CARE_PROVIDER_SITE_OTHER): Payer: Self-pay

## 2021-12-20 LAB — FECAL OCCULT BLOOD, IMMUNOCHEMICAL: Fecal Occult Bld: NEGATIVE

## 2021-12-20 NOTE — Telephone Encounter (Signed)
Contacted pt to go over lab results pt is aware and doesn't have any questions or concerns 

## 2021-12-25 ENCOUNTER — Other Ambulatory Visit: Payer: Self-pay

## 2022-01-02 ENCOUNTER — Ambulatory Visit: Payer: Self-pay

## 2022-01-07 ENCOUNTER — Other Ambulatory Visit: Payer: Self-pay

## 2022-01-07 ENCOUNTER — Encounter (HOSPITAL_COMMUNITY): Payer: Self-pay

## 2022-01-07 ENCOUNTER — Emergency Department (HOSPITAL_COMMUNITY)
Admission: EM | Admit: 2022-01-07 | Discharge: 2022-01-07 | Disposition: A | Discharge status: Home / Self Care | Payer: Self-pay | Attending: Emergency Medicine | Admitting: Emergency Medicine

## 2022-01-07 ENCOUNTER — Emergency Department (HOSPITAL_COMMUNITY): Payer: No Typology Code available for payment source

## 2022-01-07 DIAGNOSIS — S3994XA Unspecified injury of external genitals, initial encounter: Secondary | ICD-10-CM | POA: Insufficient documentation

## 2022-01-07 DIAGNOSIS — Z7984 Long term (current) use of oral hypoglycemic drugs: Secondary | ICD-10-CM | POA: Insufficient documentation

## 2022-01-07 DIAGNOSIS — Y9389 Activity, other specified: Secondary | ICD-10-CM | POA: Insufficient documentation

## 2022-01-07 DIAGNOSIS — Y929 Unspecified place or not applicable: Secondary | ICD-10-CM | POA: Insufficient documentation

## 2022-01-07 DIAGNOSIS — I10 Essential (primary) hypertension: Secondary | ICD-10-CM | POA: Insufficient documentation

## 2022-01-07 DIAGNOSIS — Z79899 Other long term (current) drug therapy: Secondary | ICD-10-CM | POA: Insufficient documentation

## 2022-01-07 DIAGNOSIS — W19XXXA Unspecified fall, initial encounter: Secondary | ICD-10-CM | POA: Insufficient documentation

## 2022-01-07 DIAGNOSIS — Y999 Unspecified external cause status: Secondary | ICD-10-CM | POA: Insufficient documentation

## 2022-01-07 DIAGNOSIS — E119 Type 2 diabetes mellitus without complications: Secondary | ICD-10-CM | POA: Insufficient documentation

## 2022-01-07 MED ORDER — ACETAMINOPHEN 500 MG PO TABS
1000.0000 mg | ORAL_TABLET | Freq: Once | ORAL | Status: AC
  Administered 2022-01-07: 1000 mg via ORAL
  Filled 2022-01-07: qty 2

## 2022-01-07 MED ORDER — AMLODIPINE BESYLATE 5 MG PO TABS
10.0000 mg | ORAL_TABLET | Freq: Once | ORAL | Status: AC
  Administered 2022-01-07: 10 mg via ORAL
  Filled 2022-01-07: qty 2

## 2022-01-07 MED ORDER — CARVEDILOL 12.5 MG PO TABS: 12.5000 mg | ORAL_TABLET | Freq: Two times a day (BID) | ORAL | Status: DC

## 2022-01-07 MED ORDER — NAPROXEN 500 MG PO TABS
500.0000 mg | ORAL_TABLET | Freq: Two times a day (BID) | ORAL | 0 refills | Status: DC
  Filled 2022-01-07: qty 14, 7d supply, fill #0

## 2022-01-07 MED ORDER — KETOROLAC TROMETHAMINE 15 MG/ML IJ SOLN
15.0000 mg | Freq: Once | INTRAMUSCULAR | Status: AC
  Administered 2022-01-07: 15 mg via INTRAMUSCULAR
  Filled 2022-01-07: qty 1

## 2022-01-07 NOTE — ED Triage Notes (Signed)
Patient states she fell on a bed rail 3 days ago and is having pain in her vaginal area. Patient also c/o nausea.

## 2022-01-07 NOTE — ED Provider Notes (Signed)
[facility] Provider Note   CSN: [id number] Arrival date & time: 01/07/22  0809     History PMH HTN, DM2, HLD, HTN, Hx CVA, Hx of Polysubstance abuse Chief Complaint  Patient presents with   Vaginal Injury    Stephanie [name] is a 51 y.o. female. Patient presenting after vaginal injury that occurred three days ago. She said she was standing on her bed trying to fix a blind when she fell backwards an landed on her wooden bed rail. She landed on her pelvis/vaginal area. She has had pain since then. She has not used any medications to treat pain. She has been able to ambulate. Denies numbness or tingling in lower ext. She is not on blood thinners. She does have history of HTN but has not taken any of her medications for two days.   Vaginal Injury       Home Medications Prior to Admission medications   Medication Sig Start Date End Date Taking? Authorizing Provider  naproxen (NAPROSYN) 500 MG tablet Take 1 tablet (500 mg total) by mouth 2 (two) times daily for 7 days. 01/07/22 01/14/22 Yes Kamrie Fanton, Finis Bud, PA-C  albuterol (VENTOLIN HFA) 108 (90 Base) MCG/ACT inhaler Inhale 2 puffs into the lungs every 6 (six) hours as needed for wheezing or shortness of breath. 03/25/20   Arvilla Market, MD  amLODipine (NORVASC) 10 MG tablet Take 1 tablet (10 mg total) by mouth daily. 07/13/21 07/13/22  Lewie Chamber, MD  atorvastatin (LIPITOR) 40 MG tablet Take 1 tablet (40 mg total) by mouth daily. 07/14/21   Lewie Chamber, MD  carvedilol (COREG) 12.5 MG tablet Take 1 tablet (12.5 mg total) by mouth 2 (two) times daily with a meal. 07/13/21 07/13/22  Lewie Chamber, MD  escitalopram (LEXAPRO) 10 MG tablet Take 1 tablet (10 mg total) by mouth daily. 12/18/21   Grayce Sessions, NP  gabapentin (NEURONTIN) 300 MG capsule Take 1 capsule (300 mg total) by mouth 2 (two) times daily. 07/13/21   Lewie Chamber, MD  metFORMIN (GLUCOPHAGE) 500 MG tablet Take 1 tablet  (500 mg total) by mouth 2 (two) times daily with a meal. 12/18/21   Grayce Sessions, NP  TRUE METRIX BLOOD GLUCOSE TEST test strip Use as instructed. Check blood glucose level by fingerstick twice per day. 10/13/18   Claiborne Rigg, NP  TRUEplus Lancets 28G MISC Use as instructed. Check blood glucose level by fingerstick twice per day. 10/13/18   Claiborne Rigg, NP  valsartan-hydrochlorothiazide (DIOVAN-HCT) 160-25 MG tablet Take 1 tablet by mouth daily. 12/18/21   Grayce Sessions, NP      Allergies    Lisinopril    Review of Systems   Review of Systems  Genitourinary:  Positive for vaginal pain.  All other systems reviewed and are negative.   Physical Exam Updated Vital Signs BP (!) 219/118   Pulse (!) 107   Temp 98.8 F (37.1 C) (Oral)   Resp 16   Ht 4\' 11"  (1.499 m)   Wt 52 kg   SpO2 100%   BMI 23.15 kg/m  Physical Exam Vitals and nursing note reviewed.  Constitutional:      General: She is not in acute distress.    Appearance: Normal appearance. She is well-developed. She is not ill-appearing, toxic-appearing or diaphoretic.  HENT:     Head: Normocephalic and atraumatic.     Nose: No nasal deformity.     Mouth/Throat:     Lips: Pink.  No lesions.  Eyes:     General: Gaze aligned appropriately. No scleral icterus.       Right eye: No discharge.        Left eye: No discharge.     Conjunctiva/sclera: Conjunctivae normal.     Right eye: Right conjunctiva is not injected. No exudate or hemorrhage.    Left eye: Left conjunctiva is not injected. No exudate or hemorrhage. Pulmonary:     Effort: Pulmonary effort is normal. No respiratory distress.  Genitourinary:    Labia:        Right: Tenderness and injury present.        Left: No injury.        Comments: Moderate sized hematoma of right labia. Very tender to touch. Not fluctuant. Not erythematous. No abnormal drainage. No laceration or bleeding from external vagina. ROM of hips intact.  Skin:    General:  Skin is warm and dry.  Neurological:     Mental Status: She is alert and oriented to person, place, and time.  Psychiatric:        Mood and Affect: Mood normal.        Speech: Speech normal.        Behavior: Behavior normal. Behavior is cooperative.     ED Results / Procedures / Treatments   Labs (all labs ordered are listed, but only abnormal results are displayed) Labs Reviewed - No data to display  EKG None  Radiology DG Pelvis 1-2 Views  Result Date: 01/07/2022 CLINICAL DATA:  Fall 3 days ago.  Pelvic pain. EXAM: PELVIS - 1 VIEW COMPARISON:  None Available. FINDINGS: There is no evidence of pelvic fracture or diastasis. No pelvic bone lesions are seen. IMPRESSION: Negative. Electronically Signed   By: Danae Orleans M.D.   On: 01/07/2022 10:09    Procedures Procedures   Medications Ordered in ED Medications  carvedilol (COREG) tablet 12.5 mg (has no administration in time range)  amLODipine (NORVASC) tablet 10 mg (10 mg Oral Given 01/07/22 1001)  ketorolac (TORADOL) 15 MG/ML injection 15 mg (15 mg Intramuscular Given 01/07/22 1001)  acetaminophen (TYLENOL) tablet 1,000 mg (1,000 mg Oral Given 01/07/22 1001)    ED Course/ Medical Decision Making/ A&P                           Medical Decision Making Amount and/or Complexity of Data Reviewed Radiology: ordered.  Risk OTC drugs. Prescription drug management.   Patient is here due to an injury to her labia majora.  He appears to have a moderate-sized hematoma on the right labia.  She has no other associated injuries.  We contained a pelvic x-ray which did not show any fracture.  Recommend supportive treatment for this including warm compress and anti-inflammatories.  She also is hypertensive here, however reviewed past medical records and her blood pressure similar to prior.  She has been off her blood pressure medications for several days and has a history of noncompliance.  She has no chest pain, shortness of breath,  headache, or other symptoms suggestive of endorgan damage.  She has had labs done recently so I do not feel we need to repeat these today.  I have given her both her Norvasc and Coreg today.  I discussed findings with patient and plan for treatment.  Urged importance of BP management at home. She is stable for discharge.  Final Clinical Impression(s) / ED Diagnoses Final diagnoses:  Injury of vulva,  initial encounter    Rx / DC Orders ED Discharge Orders          Ordered    naproxen (NAPROSYN) 500 MG tablet  2 times daily        01/07/22 1032    naproxen (NAPROSYN) 500 MG tablet  2 times daily        Pending              Claudie Leach, PA-C 01/07/22 1312    Alvira Monday, MD 01/09/22 2045

## 2022-01-07 NOTE — Discharge Instructions (Signed)
You have been seen in the ED for a vaginal injury. You have a hematoma that has developed. This will take time to heal. Use warm compresses to decrease swelling several times a day. Also use Naproxen which is the medication I have prescribed for you.   Please restart your BP medications

## 2022-01-08 ENCOUNTER — Other Ambulatory Visit: Payer: Self-pay

## 2022-01-20 ENCOUNTER — Emergency Department (HOSPITAL_COMMUNITY)
Admission: EM | Admit: 2022-01-20 | Discharge: 2022-01-20 | Discharge status: Against Medical Advice | Payer: No Typology Code available for payment source | Attending: Emergency Medicine | Admitting: Emergency Medicine

## 2022-01-20 ENCOUNTER — Encounter (HOSPITAL_COMMUNITY): Payer: Self-pay

## 2022-01-20 DIAGNOSIS — L02413 Cutaneous abscess of right upper limb: Secondary | ICD-10-CM | POA: Insufficient documentation

## 2022-01-20 DIAGNOSIS — R6883 Chills (without fever): Secondary | ICD-10-CM | POA: Insufficient documentation

## 2022-01-20 DIAGNOSIS — R110 Nausea: Secondary | ICD-10-CM | POA: Diagnosis not present

## 2022-01-20 DIAGNOSIS — Z5321 Procedure and treatment not carried out due to patient leaving prior to being seen by health care provider: Secondary | ICD-10-CM | POA: Diagnosis not present

## 2022-01-20 LAB — CBC
HCT: 35.5 % — ABNORMAL LOW (ref 36.0–46.0)
Hemoglobin: 12.1 g/dL (ref 12.0–15.0)
MCH: 30.4 pg (ref 26.0–34.0)
MCHC: 34.1 g/dL (ref 30.0–36.0)
MCV: 89.2 fL (ref 80.0–100.0)
Platelets: 225 10*3/uL (ref 150–400)
RBC: 3.98 MIL/uL (ref 3.87–5.11)
RDW: 16.9 % — ABNORMAL HIGH (ref 11.5–15.5)
WBC: 5.8 10*3/uL (ref 4.0–10.5)
nRBC: 0.9 % — ABNORMAL HIGH (ref 0.0–0.2)

## 2022-01-20 LAB — BASIC METABOLIC PANEL
Anion gap: 6 (ref 5–15)
BUN: 12 mg/dL (ref 6–20)
CO2: 26 mmol/L (ref 22–32)
Calcium: 8.3 mg/dL — ABNORMAL LOW (ref 8.9–10.3)
Chloride: 107 mmol/L (ref 98–111)
Creatinine, Ser: 1.06 mg/dL — ABNORMAL HIGH (ref 0.44–1.00)
GFR, Estimated: >60 mL/min (ref >60)
Glucose, Bld: 264 mg/dL — ABNORMAL HIGH (ref 70–99)
Potassium: 3.2 mmol/L — ABNORMAL LOW (ref 3.5–5.1)
Sodium: 139 mmol/L (ref 135–145)

## 2022-01-20 NOTE — ED Provider Triage Note (Cosign Needed)
Emergency Medicine Provider Triage Evaluation Note  Stephanie [name] , a 51 y.o. female  was evaluated in triage.  Pt complains of abscess under right arm, present for 3 days. Hx of same, requiring I&D in the past. Complaining of chills and nausea, no vomiting.   Review of Systems  Positive: Abscess, chills, nausea Negative: Vomiting, fever  Physical Exam  There were no vitals taken for this visit. Gen:   Awake, no distress   Resp:  Normal effort  MSK:   Moves extremities without difficulty  Other:  Approximately 2.5 cm abscess in right axilla with erythema and fluctuance  Medical Decision Making  Medically screening exam initiated at 5:08 PM.  Appropriate orders placed.  Stephanie [name] was informed that the remainder of the evaluation will be completed by another provider, this initial triage assessment does not replace that evaluation, and the importance of remaining in the ED until their evaluation is complete.  Will obtain basic blood work with hx of similar abscess, comorbidity of diabetes, and feelings of chills to assess for systemic infection   [name], PA-C 01/20/22 1709

## 2022-01-20 NOTE — ED Triage Notes (Signed)
Pt arrived via POV, c/o abscess under right arm. Hx of same, requiring I&D

## 2022-03-07 ENCOUNTER — Other Ambulatory Visit (INDEPENDENT_AMBULATORY_CARE_PROVIDER_SITE_OTHER): Payer: Self-pay | Admitting: Primary Care

## 2022-03-07 ENCOUNTER — Other Ambulatory Visit: Payer: Self-pay

## 2022-03-07 MED ORDER — ALBUTEROL SULFATE HFA 108 (90 BASE) MCG/ACT IN AERS
2.0000 | INHALATION_SPRAY | Freq: Four times a day (QID) | RESPIRATORY_TRACT | 1 refills | Status: DC | PRN
  Filled 2022-03-07: qty 6.7, 10d supply, fill #0

## 2022-04-18 ENCOUNTER — Encounter (INDEPENDENT_AMBULATORY_CARE_PROVIDER_SITE_OTHER): Payer: Self-pay

## 2022-04-25 ENCOUNTER — Ambulatory Visit (INDEPENDENT_AMBULATORY_CARE_PROVIDER_SITE_OTHER): Payer: 59 | Admitting: Primary Care

## 2022-04-25 ENCOUNTER — Other Ambulatory Visit: Payer: Self-pay

## 2022-04-25 ENCOUNTER — Encounter (INDEPENDENT_AMBULATORY_CARE_PROVIDER_SITE_OTHER): Payer: Self-pay | Admitting: Primary Care

## 2022-04-25 VITALS — BP 137/89 | HR 85 | Resp 16 | Ht 59.0 in | Wt 115.8 lb

## 2022-04-25 DIAGNOSIS — E1159 Type 2 diabetes mellitus with other circulatory complications: Secondary | ICD-10-CM | POA: Diagnosis not present

## 2022-04-25 DIAGNOSIS — E876 Hypokalemia: Secondary | ICD-10-CM | POA: Diagnosis not present

## 2022-04-25 DIAGNOSIS — I10 Essential (primary) hypertension: Secondary | ICD-10-CM | POA: Diagnosis not present

## 2022-04-25 LAB — POCT GLYCOSYLATED HEMOGLOBIN (HGB A1C): HbA1c, POC (controlled diabetic range): 6.4 % (ref 0.0–7.0)

## 2022-04-25 MED ORDER — VALSARTAN-HYDROCHLOROTHIAZIDE 160-25 MG PO TABS
1.0000 | ORAL_TABLET | Freq: Every day | ORAL | 1 refills | Status: DC
  Filled 2022-04-25: qty 30, 30d supply, fill #0

## 2022-04-25 MED ORDER — AMLODIPINE BESYLATE 10 MG PO TABS
10.0000 mg | ORAL_TABLET | Freq: Every day | ORAL | 1 refills | Status: DC
  Filled 2022-04-25: qty 30, 30d supply, fill #0

## 2022-04-25 MED ORDER — CARVEDILOL 12.5 MG PO TABS
12.5000 mg | ORAL_TABLET | Freq: Two times a day (BID) | ORAL | 1 refills | Status: DC
  Filled 2022-04-25: qty 60, 30d supply, fill #0

## 2022-04-25 MED ORDER — POTASSIUM CHLORIDE CRYS ER 10 MEQ PO TBCR
20.0000 meq | EXTENDED_RELEASE_TABLET | Freq: Every day | ORAL | 0 refills | Status: AC
  Filled 2022-04-25: qty 60, 30d supply, fill #0

## 2022-04-25 NOTE — Progress Notes (Signed)
[place]  Stephanie [name], is a 52 y.o. female  [id number]  [id number]  DOB - [birth year]  Chief Complaint  Patient presents with   Hypertension   Diabetes       Subjective:   Stephanie [name] is a 52 y.o. female here today for a follow up visit for the management of hypertension. Patient has No headache, No chest pain, No abdominal pain - No Nausea, No new weakness tingling or numbness, No Cough - shortness of breath.  Also, diabetes-she denies polyuria, polydipsia, polyphasia or vision changes.  Patient was educated/teaching on medications she brought a whole bag of pills in somewhat duplicates explained what each 1 was for and combined the duplicates.-When she realized she was not taking her valsartan/HCTZ.  Review of previous blood pressure were elevated.  Medications refilled.  Encouraged to apply for [facility] assistance program and follow-up for BP and fasting labs.  No problems updated.  Allergies  Allergen Reactions   Lisinopril Cough    Past Medical History:  Diagnosis Date   Diabetes mellitus without complication (HCC)    High cholesterol    Hypertension     Current Outpatient Medications on File Prior to Visit  Medication Sig Dispense Refill   albuterol (VENTOLIN HFA) 108 (90 Base) MCG/ACT inhaler INHALE 2 PUFFS INTO THE LUNGS EVERY 6 (SIX) HOURS AS NEEDED FOR WHEEZING OR SHORTNESS OF BREATH. 6.7 g 1   amLODipine (NORVASC) 10 MG tablet Take 1 tablet (10 mg total) by mouth daily. 30 tablet 3   atorvastatin (LIPITOR) 40 MG tablet Take 1 tablet (40 mg total) by mouth daily. 30 tablet 3   carvedilol (COREG) 12.5 MG tablet Take 1 tablet (12.5 mg total) by mouth 2 (two) times daily with a meal. 60 tablet 3   gabapentin (NEURONTIN) 300 MG capsule Take 1 capsule (300 mg total) by mouth 2 (two) times daily. 60 capsule 3   metFORMIN (GLUCOPHAGE) 500 MG tablet Take 1 tablet (500 mg total) by mouth 2 (two) times daily with a meal. 180 tablet 1    naproxen (NAPROSYN) 500 MG tablet Take 1 tablet (500 mg total) by mouth 2 (two) times daily. 14 tablet 0   TRUE METRIX BLOOD GLUCOSE TEST test strip Use as instructed. Check blood glucose level by fingerstick twice per day. 100 each 1   TRUEplus Lancets 28G MISC Use as instructed. Check blood glucose level by fingerstick twice per day. 100 each 2   valsartan-hydrochlorothiazide (DIOVAN-HCT) 160-25 MG tablet Take 1 tablet by mouth daily. 90 tablet 1   No current facility-administered medications on file prior to visit.    Objective:   Vitals:   04/25/22 1525  BP: 137/89  Pulse: 85  Resp: 16  SpO2: 100%  Weight: 115 lb 12.8 oz (52.5 kg)  Height: 4\' 11"  (1.499 m)    Comprehensive ROS Pertinent positive and negative noted in HPI   Exam General appearance : Awake, alert, not in any distress. Speech Clear. Not toxic looking HEENT: Atraumatic and Normocephalic, pupils equally reactive to light and accomodation Neck: Supple, no JVD. No cervical lymphadenopathy.  Chest: Good air entry bilaterally, no added sounds  CVS: S1 S2 regular, no murmurs.  Abdomen: Bowel sounds present, Non tender and not distended with no gaurding, rigidity or rebound. Extremities: B/L Lower Ext shows no edema, both legs are warm to touch Neurology: Awake alert, and oriented X 3, Non focal Skin: No Rash  Data Review Lab Results  Component Value Date   HGBA1C 6.4  04/25/2022   HGBA1C 6.5 11/16/2021   HGBA1C 7.9 (H) 07/12/2021    Assessment & Plan   Stephanie [name] was seen today for hypertension and diabetes.  Diagnoses and all orders for this visit:  Type 2 diabetes mellitus with other circulatory complication, without long-term current use of insulin (HCC) -     POCT glycosylated hemoglobin (Hb A1C) 6.4 - educated on lifestyle modifications, including but not limited to diet choices and adding exercise to daily routine.    Essential hypertension BP goal - < 130/80 Explained that having normal blood  pressure is the goal and medications are helping to get to goal and maintain normal blood pressure. DIET: Limit salt intake, read nutrition labels to check salt content, limit fried and high fatty foods  Avoid using multisymptom OTC cold preparations that generally contain sudafed which can rise BP. Consult with pharmacist on best cold relief products to use for persons with HTN EXERCISE Discussed incorporating exercise such as walking - 30 minutes most days of the week and can do in 10 minute intervals     Hypokalemia Review of previous labs indicated low potassium started KCl 10 mEq daily  Patient have been counseled extensively about nutrition and exercise. Other issues discussed during this visit include: low cholesterol diet, weight control and daily exercise, foot care, annual eye examinations at Ophthalmology, importance of adherence with medications and regular follow-up. We also discussed long term complications of uncontrolled diabetes and hypertension.   Return in about 6 weeks (around 06/06/2022) for pap.  The patient was given clear instructions to go to ER or return to medical center if symptoms don't improve, worsen or new problems develop. The patient verbalized understanding. The patient was told to call to get lab results if they haven't heard anything in the next week.   This note has been created with Surveyor, quantity. Any transcriptional errors are unintentional.   Kerin Perna, NP 04/25/2022, 3:48 PM

## 2022-05-01 ENCOUNTER — Other Ambulatory Visit: Payer: Self-pay

## 2022-05-02 ENCOUNTER — Other Ambulatory Visit: Payer: Self-pay

## 2022-06-16 DIAGNOSIS — E1142 Type 2 diabetes mellitus with diabetic polyneuropathy: Secondary | ICD-10-CM | POA: Diagnosis not present

## 2022-06-16 DIAGNOSIS — Z8249 Family history of ischemic heart disease and other diseases of the circulatory system: Secondary | ICD-10-CM | POA: Diagnosis not present

## 2022-06-16 DIAGNOSIS — Z720 Tobacco use: Secondary | ICD-10-CM | POA: Diagnosis not present

## 2022-06-16 DIAGNOSIS — Z5948 Other specified lack of adequate food: Secondary | ICD-10-CM | POA: Diagnosis not present

## 2022-06-16 DIAGNOSIS — I10 Essential (primary) hypertension: Secondary | ICD-10-CM | POA: Diagnosis not present

## 2022-06-16 DIAGNOSIS — Z5986 Financial insecurity: Secondary | ICD-10-CM | POA: Diagnosis not present

## 2022-06-16 DIAGNOSIS — Z7984 Long term (current) use of oral hypoglycemic drugs: Secondary | ICD-10-CM | POA: Diagnosis not present

## 2022-06-16 DIAGNOSIS — Z8673 Personal history of transient ischemic attack (TIA), and cerebral infarction without residual deficits: Secondary | ICD-10-CM | POA: Diagnosis not present

## 2022-06-16 DIAGNOSIS — Z833 Family history of diabetes mellitus: Secondary | ICD-10-CM | POA: Diagnosis not present

## 2022-07-16 ENCOUNTER — Other Ambulatory Visit (INDEPENDENT_AMBULATORY_CARE_PROVIDER_SITE_OTHER): Payer: Self-pay | Admitting: Primary Care

## 2022-07-16 DIAGNOSIS — I10 Essential (primary) hypertension: Secondary | ICD-10-CM

## 2022-07-16 DIAGNOSIS — E1159 Type 2 diabetes mellitus with other circulatory complications: Secondary | ICD-10-CM

## 2022-07-16 NOTE — Telephone Encounter (Signed)
Medication Refill - Medication: albuterol (VENTOLIN HFA) 108 (90 Base) MCG/ACT inhaler , amLODipine (NORVASC) 10 MG tablet , atorvastatin (LIPITOR) 40 MG tablet,  carvedilol (COREG) 12.5 MG tablet , gabapentin (NEURONTIN) 300 MG capsule , metFORMIN (GLUCOPHAGE) 500 MG tablet , valsartan-hydrochlorothiazide (DIOVAN-HCT) 160-25 MG tablet   Has the patient contacted their pharmacy? No.   Preferred Pharmacy (with phone number or street name):  [facility] [phone] [place], [place] - [address] Phone: [phone]  Fax: [phone]      Has the patient been seen for an appointment in the last year OR does the patient have an upcoming appointment? Yes.    Please assist patient further

## 2022-07-17 MED ORDER — AMLODIPINE BESYLATE 10 MG PO TABS: 10.0000 mg | ORAL_TABLET | Freq: Every day | ORAL | 1 refills | Status: DC

## 2022-07-17 MED ORDER — ALBUTEROL SULFATE HFA 108 (90 BASE) MCG/ACT IN AERS: 2.0000 | INHALATION_SPRAY | Freq: Four times a day (QID) | RESPIRATORY_TRACT | 2 refills | Status: DC | PRN

## 2022-07-17 MED ORDER — VALSARTAN-HYDROCHLOROTHIAZIDE 160-25 MG PO TABS: 1.0000 | ORAL_TABLET | Freq: Every day | ORAL | 1 refills | Status: DC

## 2022-07-17 MED ORDER — METFORMIN HCL 500 MG PO TABS: 500.0000 mg | ORAL_TABLET | Freq: Two times a day (BID) | ORAL | 1 refills | Status: DC

## 2022-07-17 MED ORDER — GABAPENTIN 300 MG PO CAPS: 300.0000 mg | ORAL_CAPSULE | Freq: Two times a day (BID) | ORAL | 1 refills | Status: DC

## 2022-07-17 NOTE — Telephone Encounter (Signed)
Requested medications are due for refill today.  yes  Requested medications are on the active medications list.  yes  Last refill. varied  Future visit scheduled.   no  Notes to clinic.  Labs are expired.    Requested Prescriptions  Pending Prescriptions Disp Refills   atorvastatin (LIPITOR) 40 MG tablet 30 tablet 3    Sig: Take 1 tablet (40 mg total) by mouth daily.     Cardiovascular:  Antilipid - Statins Failed - 07/16/2022  2:23 PM      Failed - Lipid Panel in normal range within the last 12 months    Cholesterol, Total  Date Value Ref Range Status  06/15/2020 151 100 - 199 mg/dL Final   Cholesterol  Date Value Ref Range Status  07/12/2021 154 0 - 200 mg/dL Final   LDL Chol Calc (NIH)  Date Value Ref Range Status  06/15/2020 78 0 - 99 mg/dL Final   LDL Cholesterol  Date Value Ref Range Status  07/12/2021 72 0 - 99 mg/dL Final    Comment:           Total Cholesterol/HDL:CHD Risk Coronary Heart Disease Risk Table                     Men   Women  1/2 Average Risk   3.4   3.3  Average Risk       5.0   4.4  2 X Average Risk   9.6   7.1  3 X Average Risk  23.4   11.0        Use the calculated Patient Ratio above and the CHD Risk Table to determine the patient's CHD Risk.        ATP III CLASSIFICATION (LDL):  <100     mg/dL   Optimal  308-657  mg/dL   Near or Above                    Optimal  130-159  mg/dL   Borderline  846-962  mg/dL   High  >952     mg/dL   Very High Performed at [facility], [address] [place], [place] [address]    HDL  Date Value Ref Range Status  07/12/2021 37 (L) >40 mg/dL Final  44/02/270 37 (L) >39 mg/dL Final   Triglycerides  Date Value Ref Range Status  07/12/2021 223 (H) <150 mg/dL Final         Passed - Patient is not pregnant      Passed - Valid encounter within last 12 months    Recent Outpatient Visits           2 months ago Type 2 diabetes mellitus with other circulatory complication, without  long-term current use of insulin (HCC)   [place] [facility]ce Sessions, NP   7 months ago Depression with anxiety   [place] [facility] Grayce Sessions, NP   8 months ago Type 2 diabetes mellitus with diabetic neuropathy, without long-term current use of insulin (HCC)   [place] [facility] Grayce Sessions, NP   2 years ago Type 2 diabetes mellitus with diabetic neuropathy, without long-term current use of insulin (HCC)   [place] [facility], Gildardo Pounds, NP   2 years ago Type 2 diabetes mellitus with diabetic neuropathy, unspecified whether long term insulin use (HCC)   [place] [facility]  [facility], Cari S, PA-C               carvedilol (COREG) 12.5 MG tablet 180 tablet 1    Sig: Take 1 tablet (12.5 mg total) by mouth 2 (two) times daily with a meal.     Cardiovascular: Beta Blockers 3 Failed - 07/16/2022  2:23 PM      Failed - Cr in normal range and within 360 days    Creatinine, Ser  Date Value Ref Range Status  01/20/2022 1.06 (H) 0.44 - 1.00 mg/dL Final         Failed - AST in normal range and within 360 days    AST  Date Value Ref Range Status  07/11/2021 26 15 - 41 U/L Final         Failed - ALT in normal range and within 360 days    ALT  Date Value Ref Range Status  07/11/2021 26 0 - 44 U/L Final         Passed - Last BP in normal range    BP Readings from Last 1 Encounters:  04/25/22 137/89         Passed - Last Heart Rate in normal range    Pulse Readings from Last 1 Encounters:  04/25/22 85         Passed - Valid encounter within last 6 months    Recent Outpatient Visits           2 months ago Type 2 diabetes mellitus with other circulatory complication, without long-term current use of insulin (HCC)   [place] [facility] Grayce Sessions, NP   7 months ago Depression with anxiety   [place]  [facility], NP   8 months ago Type 2 diabetes mellitus with diabetic neuropathy, without long-term current use of insulin (HCC)   [place] [facility] Grayce Sessions, NP   2 years ago Type 2 diabetes mellitus with diabetic neuropathy, without long-term current use of insulin (HCC)   [place] [facility], Gildardo Pounds, NP   2 years ago Type 2 diabetes mellitus with diabetic neuropathy, unspecified whether long term insulin use (HCC)   [place] [facility], Cari S, [place]              Signed Prescriptions Disp Refills   albuterol (VENTOLIN HFA) 108 (90 Base) MCG/ACT inhaler 6.7 g 2    Sig: INHALE 2 PUFFS INTO THE LUNGS EVERY 6 (SIX) HOURS AS NEEDED FOR WHEEZING OR SHORTNESS OF BREATH.     Pulmonology:  Beta Agonists 2 Passed - 07/16/2022  2:23 PM      Passed - Last BP in normal range    BP Readings from Last 1 Encounters:  04/25/22 137/89         Passed - Last Heart Rate in normal range    Pulse Readings from Last 1 Encounters:  04/25/22 85         Passed - Valid encounter within last 12 months    Recent Outpatient Visits           2 months ago Type 2 diabetes mellitus with other circulatory complication, without long-term current use of insulin (HCC)   [place] [facility] Sessions, NP   7 months ago Depression with anxiety    [facility] NP   8  months ago Type 2 diabetes mellitus with diabetic neuropathy, without long-term current use of insulin (HCC)   [place] [facility] Grayce Sessions, NP   2 years ago Type 2 diabetes mellitus with diabetic neuropathy, without long-term current use of insulin [facility])   [place] [facility], Gildardo Pounds, NP   2 years ago Type 2 diabetes mellitus with diabetic neuropathy, unspecified whether long term  insulin use (HCC)   [place] [facility], Cari S, PA-C               amLODipine (NORVASC) 10 MG tablet 90 tablet 1    Sig: Take 1 tablet (10 mg total) by mouth daily.     Cardiovascular: Calcium Channel Blockers 2 Passed - 07/16/2022  2:23 PM      Passed - Last BP in normal range    BP Readings from Last 1 Encounters:  04/25/22 137/89         Passed - Last Heart Rate in normal range    Pulse Readings from Last 1 Encounters:  04/25/22 85         Passed - Valid encounter within last 6 months    Recent Outpatient Visits           2 months ago Type 2 diabetes mellitus with other circulatory complication, without long-term current use of insulin (HCC)   [place] [facility] Grayce Sessions, NP   7 months ago Depression with anxiety   [place] [facility] Grayce Sessions, NP   8 months ago Type 2 diabetes mellitus with diabetic neuropathy, without long-term current use of insulin (HCC)   [place] [facility] Grayce Sessions, NP   2 years ago Type 2 diabetes mellitus with diabetic neuropathy, without long-term current use of insulin (HCC)   [place] [facility], Gildardo Pounds, NP   2 years ago Type 2 diabetes mellitus with diabetic neuropathy, unspecified whether long term insulin use (HCC)   [place] [facility], Cari S, PA-C               gabapentin (NEURONTIN) 300 MG capsule 180 capsule 1    Sig: Take 1 capsule (300 mg total) by mouth 2 (two) times daily.     Neurology: Anticonvulsants - gabapentin Failed - 07/16/2022  2:23 PM      Failed - Cr in normal range and within 360 days    Creatinine, Ser  Date Value Ref Range Status  01/20/2022 1.06 (H) 0.44 - 1.00 mg/dL Final         Passed - Completed PHQ-2 or PHQ-9 in the last 360 days      Passed - Valid encounter within last 12 months    Recent Outpatient Visits            2 months ago Type 2 diabetes mellitus with other circulatory complication, without long-term current use of insulin (HCC)   [place] [facility] Grayce Sessions, NP   7 months ago Depression with anxiety   [place] [facility] Grayce Sessions, NP   8 months ago Type 2 diabetes mellitus with diabetic neuropathy, without long-term current use of insulin (HCC)   [place] [facility] Grayce Sessions, NP   2 years ago Type 2 diabetes mellitus with diabetic neuropathy, without long-term current use of insulin (  [facility])   [place] [facility], Gildardo Pounds, NP   2 years ago Type 2 diabetes mellitus with diabetic neuropathy, unspecified whether long term insulin use (HCC)   [place] [facility], Cari S, PA-C               metFORMIN (GLUCOPHAGE) 500 MG tablet 180 tablet 1    Sig: Take 1 tablet (500 mg total) by mouth 2 (two) times daily with a meal.     Endocrinology:  Diabetes - Biguanides Failed - 07/16/2022  2:23 PM      Failed - Cr in normal range and within 360 days    Creatinine, Ser  Date Value Ref Range Status  01/20/2022 1.06 (H) 0.44 - 1.00 mg/dL Final         Failed - B12 Level in normal range and within 720 days    No results found for: "VITAMINB12"       Passed - HBA1C is between 0 and 7.9 and within 180 days    HbA1c, POC (controlled diabetic range)  Date Value Ref Range Status  04/25/2022 6.4 0.0 - 7.0 % Final         Passed - eGFR in normal range and within 360 days    GFR calc Af Amer  Date Value Ref Range Status  08/17/2019 82 >59 mL/min/1.73 Final    Comment:    **[facility] currently reports eGFR in compliance with the current**   recommendations of the [employer]. [facility] will   update reporting as new guidelines are published from the NKF-ASN   Task force.    GFR, Estimated  Date Value Ref Range Status  01/20/2022 >60  >60 mL/min Final    Comment:    (NOTE) Calculated using the CKD-EPI Creatinine Equation (2021)    eGFR  Date Value Ref Range Status  06/15/2020 95 >59 mL/min/1.73 Final         Passed - Valid encounter within last 6 months    Recent Outpatient Visits           2 months ago Type 2 diabetes mellitus with other circulatory complication, without long-term current use of insulin (HCC)   [place] [facility] Grayce Sessions, NP   7 months ago Depression with anxiety   [place] [facility] Grayce Sessions, NP   8 months ago Type 2 diabetes mellitus with diabetic neuropathy, without long-term current use of insulin (HCC)   [place] [facility] Grayce Sessions, NP   2 years ago Type 2 diabetes mellitus with diabetic neuropathy, without long-term current use of insulin (HCC)   [place] [facility], Gildardo Pounds, NP   2 years ago Type 2 diabetes mellitus with diabetic neuropathy, unspecified whether long term insulin use (HCC)   [place] [facility], Cari S, PA-C              Passed - CBC within normal limits and completed in the last 12 months    WBC  Date Value Ref Range Status  01/20/2022 5.8 4.0 - 10.5 K/uL Final    Comment:    WHITE COUNT CONFIRMED ON SMEAR   RBC  Date Value Ref Range Status  01/20/2022 3.98 3.87 - 5.11 MIL/uL Final   Hemoglobin  Date Value Ref Range Status  01/20/2022 12.1 12.0 - 15.0 g/dL Final  16/11/9602 54.0 11.1 -  15.9 g/dL Final   HCT  Date Value Ref Range Status  01/20/2022 35.5 (L) 36.0 - 46.0 % Final   Hematocrit  Date Value Ref Range Status  06/15/2020 37.3 34.0 - 46.6 % Final   MCHC  Date Value Ref Range Status  01/20/2022 34.1 30.0 - 36.0 g/dL Final   [facility]  Date Value Ref Range Status  01/20/2022 30.4 26.0 - 34.0 pg Final   MCV  Date Value Ref Range Status  01/20/2022 89.2 80.0 - 100.0 fL Final  06/15/2020  86 79 - 97 fL Final   No results found for: "PLTCOUNTKUC", "LABPLAT", "POCPLA" RDW  Date Value Ref Range Status  01/20/2022 16.9 (H) 11.5 - 15.5 % Final  06/15/2020 16.0 (H) 11.7 - 15.4 % Final          valsartan-hydrochlorothiazide (DIOVAN-HCT) 160-25 MG tablet 90 tablet 1    Sig: Take 1 tablet by mouth daily.     Cardiovascular: ARB + Diuretic Combos Failed - 07/16/2022  2:23 PM      Failed - K in normal range and within 180 days    Potassium  Date Value Ref Range Status  01/20/2022 3.2 (L) 3.5 - 5.1 mmol/L Final         Failed - Cr in normal range and within 180 days    Creatinine, Ser  Date Value Ref Range Status  01/20/2022 1.06 (H) 0.44 - 1.00 mg/dL Final         Passed - Na in normal range and within 180 days    Sodium  Date Value Ref Range Status  01/20/2022 139 135 - 145 mmol/L Final  06/15/2020 141 134 - 144 mmol/L Final         Passed - eGFR is 10 or above and within 180 days    GFR calc Af Amer  Date Value Ref Range Status  08/17/2019 82 >59 mL/min/1.73 Final    Comment:    **[facility] currently reports eGFR in compliance with the current**   recommendations of the [employer]. [facility] will   update reporting as new guidelines are published from the NKF-ASN   Task force.    GFR, Estimated  Date Value Ref Range Status  01/20/2022 >60 >60 mL/min Final    Comment:    (NOTE) Calculated using the CKD-EPI Creatinine Equation (2021)    eGFR  Date Value Ref Range Status  06/15/2020 95 >59 mL/min/1.73 Final         Passed - Patient is not pregnant      Passed - Last BP in normal range    BP Readings from Last 1 Encounters:  04/25/22 137/89         Passed - Valid encounter within last 6 months    Recent Outpatient Visits           2 months ago Type 2 diabetes mellitus with other circulatory complication, without long-term current use of insulin (HCC)   [place] [facility] Grayce Sessions, NP   7 months ago  Depression with anxiety   [place] [facility] Grayce Sessions, NP   8 months ago Type 2 diabetes mellitus with diabetic neuropathy, without long-term current use of insulin (HCC)   [place] [facility] Grayce Sessions, NP   2 years ago Type 2 diabetes mellitus with diabetic neuropathy, without long-term current use of insulin [facility])   [place] [facility], Gildardo Pounds, NP  2 years ago Type 2 diabetes mellitus with diabetic neuropathy, unspecified whether long term insulin use (HCC)   [place] [facility], [place], [place]

## 2022-07-17 NOTE — Telephone Encounter (Signed)
Requested Prescriptions  Pending Prescriptions Disp Refills   albuterol (VENTOLIN HFA) 108 (90 Base) MCG/ACT inhaler 6.7 g 2    Sig: INHALE 2 PUFFS INTO THE LUNGS EVERY 6 (SIX) HOURS AS NEEDED FOR WHEEZING OR SHORTNESS OF BREATH.     Pulmonology:  Beta Agonists 2 Passed - 07/16/2022  2:23 PM      Passed - Last BP in normal range    BP Readings from Last 1 Encounters:  04/25/22 137/89         Passed - Last Heart Rate in normal range    Pulse Readings from Last 1 Encounters:  04/25/22 85         Passed - Valid encounter within last 12 months    Recent Outpatient Visits           2 months ago Type 2 diabetes mellitus with other circulatory complication, without long-term current use of insulin (HCC)   [place] [facility] Grayce Sessions, NP   7 months ago Depression with anxiety   [place] [facility] Grayce Sessions, NP   8 months ago Type 2 diabetes mellitus with diabetic neuropathy, without long-term current use of insulin (HCC)   [place] [facility] Grayce Sessions, NP   2 years ago Type 2 diabetes mellitus with diabetic neuropathy, without long-term current use of insulin (HCC)   [place] [facility], Gildardo Pounds, NP   2 years ago Type 2 diabetes mellitus with diabetic neuropathy, unspecified whether long term insulin use (HCC)   [place] [facility], Cari S, PA-C               amLODipine (NORVASC) 10 MG tablet 90 tablet 1    Sig: Take 1 tablet (10 mg total) by mouth daily.     Cardiovascular: Calcium Channel Blockers 2 Passed - 07/16/2022  2:23 PM      Passed - Last BP in normal range    BP Readings from Last 1 Encounters:  04/25/22 137/89         Passed - Last Heart Rate in normal range    Pulse Readings from Last 1 Encounters:  04/25/22 85         Passed - Valid encounter within last 6 months    Recent Outpatient Visits           2  months ago Type 2 diabetes mellitus with other circulatory complication, without long-term current use of insulin (HCC)   [place] [facility] Grayce Sessions, NP   7 months ago Depression with anxiety   [place] [facility] Grayce Sessions, NP   8 months ago Type 2 diabetes mellitus with diabetic neuropathy, without long-term current use of insulin (HCC)   [place] [facility] Grayce Sessions, NP   2 years ago Type 2 diabetes mellitus with diabetic neuropathy, without long-term current use of insulin (HCC)   [place] [facility], Gildardo Pounds, NP   2 years ago Type 2 diabetes mellitus with diabetic neuropathy, unspecified whether long term insulin use (HCC)    [facility] at [facility], Cari S, PA-C               atorvastatin (LIPITOR) 40 MG tablet 30 tablet 3    Sig: Take 1 tablet (40 mg total) by mouth daily.     Cardiovascular:  Antilipid - Statins Failed - 07/16/2022  2:23 PM      Failed - Lipid Panel in normal range within the last 12 months    Cholesterol, Total  Date Value Ref Range Status  06/15/2020 151 100 - 199 mg/dL Final   Cholesterol  Date Value Ref Range Status  07/12/2021 154 0 - 200 mg/dL Final   LDL Chol Calc (NIH)  Date Value Ref Range Status  06/15/2020 78 0 - 99 mg/dL Final   LDL Cholesterol  Date Value Ref Range Status  07/12/2021 72 0 - 99 mg/dL Final    Comment:           Total Cholesterol/HDL:CHD Risk Coronary Heart Disease Risk Table                     Men   Women  1/2 Average Risk   3.4   3.3  Average Risk       5.0   4.4  2 X Average Risk   9.6   7.1  3 X Average Risk  23.4   11.0        Use the calculated Patient Ratio above and the CHD Risk Table to determine the patient's CHD Risk.        ATP III CLASSIFICATION (LDL):  <100     mg/dL   Optimal  161-096  mg/dL   Near or Above                    Optimal  130-159   mg/dL   Borderline  045-409  mg/dL   High  >811     mg/dL   Very High Performed at [facility], [address] [place], [place] [address]    HDL  Date Value Ref Range Status  07/12/2021 37 (L) >40 mg/dL Final  29/56/2130 37 (L) >39 mg/dL Final   Triglycerides  Date Value Ref Range Status  07/12/2021 223 (H) <150 mg/dL Final         Passed - Patient is not pregnant      Passed - Valid encounter within last 12 months    Recent Outpatient Visits           2 months ago Type 2 diabetes mellitus with other circulatory complication, without long-term current use of insulin (HCC)   [place] [facility] Grayce Sessions, NP   7 months ago Depression with anxiety   [place] [facility] Grayce Sessions, NP   8 months ago Type 2 diabetes mellitus with diabetic neuropathy, without long-term current use of insulin (HCC)   [place] [facility] Grayce Sessions, NP   2 years ago Type 2 diabetes mellitus with diabetic neuropathy, without long-term current use of insulin (HCC)   [place] [facility], Gildardo Pounds, NP   2 years ago Type 2 diabetes mellitus with diabetic neuropathy, unspecified whether long term insulin use (HCC)   [place] [facility], Cari S, PA-C               carvedilol (COREG) 12.5 MG tablet 180 tablet 1    Sig: Take 1 tablet (12.5 mg total) by mouth 2 (two) times daily with a meal.     Cardiovascular: Beta Blockers 3 Failed - 07/16/2022  2:23 PM      Failed - Cr in normal range and within 360 days  Creatinine, Ser  Date Value Ref Range Status  01/20/2022 1.06 (H) 0.44 - 1.00 mg/dL Final         Failed - AST in normal range and within 360 days    AST  Date Value Ref Range Status  07/11/2021 26 15 - 41 U/L Final         Failed - ALT in normal range and within 360 days    ALT  Date Value Ref Range Status  07/11/2021 26 0 - 44 U/L  Final         Passed - Last BP in normal range    BP Readings from Last 1 Encounters:  04/25/22 137/89         Passed - Last Heart Rate in normal range    Pulse Readings from Last 1 Encounters:  04/25/22 85         Passed - Valid encounter within last 6 months    Recent Outpatient Visits           2 months ago Type 2 diabetes mellitus with other circulatory complication, without long-term current use of insulin (HCC)   [place] [facility] Grayce Sessions, NP   7 months ago Depression with anxiety   [place] [facility] Grayce Sessions, NP   8 months ago Type 2 diabetes mellitus with diabetic neuropathy, without long-term current use of insulin (HCC)   [place] [facility] Grayce Sessions, NP   2 years ago Type 2 diabetes mellitus with diabetic neuropathy, without long-term current use of insulin (HCC)   [place] [facility], Gildardo Pounds, NP   2 years ago Type 2 diabetes mellitus with diabetic neuropathy, unspecified whether long term insulin use (HCC)   [place] [facility], Cari S, PA-C               gabapentin (NEURONTIN) 300 MG capsule 180 capsule 1    Sig: Take 1 capsule (300 mg total) by mouth 2 (two) times daily.     Neurology: Anticonvulsants - gabapentin Failed - 07/16/2022  2:23 PM      Failed - Cr in normal range and within 360 days    Creatinine, Ser  Date Value Ref Range Status  01/20/2022 1.06 (H) 0.44 - 1.00 mg/dL Final         Passed - Completed PHQ-2 or PHQ-9 in the last 360 days      Passed - Valid encounter within last 12 months    Recent Outpatient Visits           2 months ago Type 2 diabetes mellitus with other circulatory complication, without long-term current use of insulin (HCC)   [place] [facility] Grayce Sessions, NP   7 months ago Depression with anxiety   [place] [facility] Gray[facility] Sessions, NP   8 months ago Type 2 diabetes mellitus with diabetic neuropathy, without long-term current use of insulin (HCC)   [place] [facility] Grayce Sessions, NP   2 years ago Type 2 diabetes mellitus with diabetic neuropathy, without long-term current use of insulin (HCC)   [place] [facility], Gildardo Pounds, NP   2 years ago Type 2 diabetes mellitus with diabetic neuropathy, unspecified whether long term insulin use (HCC)    [facility] at [facility], [place], [place]  metFORMIN (GLUCOPHAGE) 500 MG tablet 180 tablet 1    Sig: Take 1 tablet (500 mg total) by mouth 2 (two) times daily with a meal.     Endocrinology:  Diabetes - Biguanides Failed - 07/16/2022  2:23 PM      Failed - Cr in normal range and within 360 days    Creatinine, Ser  Date Value Ref Range Status  01/20/2022 1.06 (H) 0.44 - 1.00 mg/dL Final         Failed - B12 Level in normal range and within 720 days    No results found for: "VITAMINB12"       Passed - HBA1C is between 0 and 7.9 and within 180 days    HbA1c, POC (controlled diabetic range)  Date Value Ref Range Status  04/25/2022 6.4 0.0 - 7.0 % Final         Passed - eGFR in normal range and within 360 days    GFR calc Af Amer  Date Value Ref Range Status  08/17/2019 82 >59 mL/min/1.73 Final    Comment:    **[facility] currently reports eGFR in compliance with the current**   recommendations of the [employer]. [facility] will   update reporting as new guidelines are published from the NKF-ASN   Task force.    GFR, Estimated  Date Value Ref Range Status  01/20/2022 >60 >60 mL/min Final    Comment:    (NOTE) Calculated using the CKD-EPI Creatinine Equation (2021)    eGFR  Date Value Ref Range Status  06/15/2020 95 >59 mL/min/1.73 Final         Passed - Valid encounter within last 6 months    Recent Outpatient Visits            2 months ago Type 2 diabetes mellitus with other circulatory complication, without long-term current use of insulin (HCC)   [place] [facility] Grayce Sessions, NP   7 months ago Depression with anxiety   [place] [facility] Grayce Sessions, NP   8 months ago Type 2 diabetes mellitus with diabetic neuropathy, without long-term current use of insulin (HCC)   [place] [facility] Grayce Sessions, NP   2 years ago Type 2 diabetes mellitus with diabetic neuropathy, without long-term current use of insulin (HCC)   [place] [facility], Gildardo Pounds, NP   2 years ago Type 2 diabetes mellitus with diabetic neuropathy, unspecified whether long term insulin use (HCC)   [place] [facility], Cari S, PA-C              Passed - CBC within normal limits and completed in the last 12 months    WBC  Date Value Ref Range Status  01/20/2022 5.8 4.0 - 10.5 K/uL Final    Comment:    WHITE COUNT CONFIRMED ON SMEAR   RBC  Date Value Ref Range Status  01/20/2022 3.98 3.87 - 5.11 MIL/uL Final   Hemoglobin  Date Value Ref Range Status  01/20/2022 12.1 12.0 - 15.0 g/dL Final  16/11/9602 54.0 11.1 - 15.9 g/dL Final   HCT  Date Value Ref Range Status  01/20/2022 35.5 (L) 36.0 - 46.0 % Final   Hematocrit  Date Value Ref Range Status  06/15/2020 37.3 34.0 - 46.6 % Final   MCHC  Date Value Ref Range Status  01/20/2022 34.1 30.0 - 36.0 g/dL Final   [facility]  Date Value Ref Range Status  01/20/2022 30.4 26.0 - 34.0 pg Final   MCV  Date Value Ref Range Status  01/20/2022 89.2 80.0 - 100.0 fL Final  06/15/2020 86 79 - 97 fL Final   No results found for: "PLTCOUNTKUC", "LABPLAT", "POCPLA" RDW  Date Value Ref Range Status  01/20/2022 16.9 (H) 11.5 - 15.5 % Final  06/15/2020 16.0 (H) 11.7 - 15.4 % Final          valsartan-hydrochlorothiazide (DIOVAN-HCT) 160-25 MG  tablet 90 tablet 1    Sig: Take 1 tablet by mouth daily.     Cardiovascular: ARB + Diuretic Combos Failed - 07/16/2022  2:23 PM      Failed - K in normal range and within 180 days    Potassium  Date Value Ref Range Status  01/20/2022 3.2 (L) 3.5 - 5.1 mmol/L Final         Failed - Cr in normal range and within 180 days    Creatinine, Ser  Date Value Ref Range Status  01/20/2022 1.06 (H) 0.44 - 1.00 mg/dL Final         Passed - Na in normal range and within 180 days    Sodium  Date Value Ref Range Status  01/20/2022 139 135 - 145 mmol/L Final  06/15/2020 141 134 - 144 mmol/L Final         Passed - eGFR is 10 or above and within 180 days    GFR calc Af Amer  Date Value Ref Range Status  08/17/2019 82 >59 mL/min/1.73 Final    Comment:    **[facility] currently reports eGFR in compliance with the current**   recommendations of the [employer]. [facility] will   update reporting as new guidelines are published from the NKF-ASN   Task force.    GFR, Estimated  Date Value Ref Range Status  01/20/2022 >60 >60 mL/min Final    Comment:    (NOTE) Calculated using the CKD-EPI Creatinine Equation (2021)    eGFR  Date Value Ref Range Status  06/15/2020 95 >59 mL/min/1.73 Final         Passed - Patient is not pregnant      Passed - Last BP in normal range    BP Readings from Last 1 Encounters:  04/25/22 137/89         Passed - Valid encounter within last 6 months    Recent Outpatient Visits           2 months ago Type 2 diabetes mellitus with other circulatory complication, without long-term current use of insulin (HCC)   [place] [facility] Grayce Sessions, NP   7 months ago Depression with anxiety   [place] [facility] Grayce Sessions, NP   8 months ago Type 2 diabetes mellitus with diabetic neuropathy, without long-term current use of insulin (HCC)   [place] [facility] Grayce Sessions,  NP   2 years ago Type 2 diabetes mellitus with diabetic neuropathy, without long-term current use of insulin (HCC)   [place] [facility], Gildardo Pounds, NP   2 years ago Type 2 diabetes mellitus with diabetic neuropathy, unspecified whether long term insulin use (HCC)   [place] [facility], [place], [place]

## 2022-07-19 NOTE — Telephone Encounter (Signed)
Will forward to provider  

## 2022-07-20 MED ORDER — ATORVASTATIN CALCIUM 40 MG PO TABS: 40.0000 mg | ORAL_TABLET | Freq: Every day | ORAL | 3 refills | Status: DC

## 2022-12-19 ENCOUNTER — Ambulatory Visit (INDEPENDENT_AMBULATORY_CARE_PROVIDER_SITE_OTHER): Payer: 59 | Admitting: Primary Care

## 2022-12-25 ENCOUNTER — Ambulatory Visit (INDEPENDENT_AMBULATORY_CARE_PROVIDER_SITE_OTHER): Payer: 59 | Admitting: Primary Care

## 2022-12-31 ENCOUNTER — Telehealth: Payer: Self-pay | Admitting: Primary Care

## 2022-12-31 NOTE — Telephone Encounter (Signed)
Called pt to remind them about apt.

## 2023-01-01 ENCOUNTER — Ambulatory Visit (INDEPENDENT_AMBULATORY_CARE_PROVIDER_SITE_OTHER): Payer: 59 | Admitting: Primary Care

## 2023-01-02 ENCOUNTER — Other Ambulatory Visit (INDEPENDENT_AMBULATORY_CARE_PROVIDER_SITE_OTHER): Payer: Self-pay | Admitting: Primary Care

## 2023-02-04 ENCOUNTER — Other Ambulatory Visit (INDEPENDENT_AMBULATORY_CARE_PROVIDER_SITE_OTHER): Payer: Self-pay | Admitting: Primary Care

## 2023-02-04 DIAGNOSIS — E1159 Type 2 diabetes mellitus with other circulatory complications: Secondary | ICD-10-CM

## 2023-02-04 DIAGNOSIS — I10 Essential (primary) hypertension: Secondary | ICD-10-CM

## 2023-02-12 ENCOUNTER — Other Ambulatory Visit (INDEPENDENT_AMBULATORY_CARE_PROVIDER_SITE_OTHER): Payer: Self-pay | Admitting: Primary Care

## 2023-02-12 DIAGNOSIS — E1159 Type 2 diabetes mellitus with other circulatory complications: Secondary | ICD-10-CM

## 2023-02-12 DIAGNOSIS — I10 Essential (primary) hypertension: Secondary | ICD-10-CM

## 2023-02-12 NOTE — Telephone Encounter (Signed)
 Medication Refill -  Most Recent Primary Care Visit:  Provider: CELESTIA ROSALINE SQUIBB  Department: [facility]  Visit Type: OFFICE VISIT  Date: 04/25/2022  Medication:  amLODipine  (NORVASC ) 10 MG tablet atorvastatin  (LIPITOR) 40 MG tablet gabapentin  (NEURONTIN ) 300 MG capsule metFORMIN  (GLUCOPHAGE ) 500 MG table valsartan -hydrochlorothiazide  (DIOVAN -HCT) 160-25 MG tablet  Has the patient contacted their pharmacy? Yes (Agent: If no, request that the patient contact the pharmacy for the refill. If patient does not wish to contact the pharmacy document the reason why and proceed with request.) (Agent: If yes, when and what did the pharmacy advise?) Pt needed to make an appt, scheduled her for 02/20/2023 Is this the correct pharmacy for this prescription? Yes If no, delete pharmacy and type the correct one.  This is the patient's preferred pharmacy:  [facility] [phone] [address], [place] - [address] [place] [address] Phone: [phone] Fax: [phone]   Has the prescription been filled recently? No  Is the patient out of the medication? Yes  Has the patient been seen for an appointment in the last year OR does the patient have an upcoming appointment? Yes  Can we respond through MyChart? No  Agent: Please be advised that Rx refills may take up to 3 business days. We ask that you follow-up with your pharmacy.

## 2023-02-15 MED ORDER — AMLODIPINE BESYLATE 10 MG PO TABS: 10.0000 mg | ORAL_TABLET | Freq: Every day | ORAL | 0 refills | Status: DC

## 2023-02-15 NOTE — Telephone Encounter (Signed)
 Requested medications are due for refill today.  yes  Requested medications are on the active medications list.  yes  Last refill. 07/2022  Future visit scheduled.   yes  Notes to clinic.  Labs are expired .    Requested Prescriptions  Pending Prescriptions Disp Refills   atorvastatin  (LIPITOR) 40 MG tablet 30 tablet 3    Sig: Take 1 tablet (40 mg total) by mouth daily.     Cardiovascular:  Antilipid - Statins Failed - 02/15/2023  7:42 AM      Failed - Lipid Panel in normal range within the last 12 months    Cholesterol, Total  Date Value Ref Range Status  06/15/2020 151 100 - 199 mg/dL Final   Cholesterol  Date Value Ref Range Status  07/12/2021 154 0 - 200 mg/dL Final   LDL Chol Calc (NIH)  Date Value Ref Range Status  06/15/2020 78 0 - 99 mg/dL Final   LDL Cholesterol  Date Value Ref Range Status  07/12/2021 72 0 - 99 mg/dL Final    Comment:           Total Cholesterol/HDL:CHD Risk Coronary Heart Disease Risk Table                     Men   Women  1/2 Average Risk   3.4   3.3  Average Risk       5.0   4.4  2 X Average Risk   9.6   7.1  3 X Average Risk  23.4   11.0        Use the calculated Patient Ratio above and the CHD Risk Table to determine the patient's CHD Risk.        ATP III CLASSIFICATION (LDL):  <100     mg/dL   Optimal  899-870  mg/dL   Near or Above                    Optimal  130-159  mg/dL   Borderline  839-810  mg/dL   High  >809     mg/dL   Very High Performed at [facility], [address] [place], [place] [address]    HDL  Date Value Ref Range Status  07/12/2021 37 (L) >40 mg/dL Final  94/88/7977 37 (L) >39 mg/dL Final   Triglycerides  Date Value Ref Range Status  07/12/2021 223 (H) <150 mg/dL Final         Passed - Patient is not pregnant      Passed - Valid encounter within last 12 months    Recent Outpatient Visits           9 months ago Type 2 diabetes mellitus with other circulatory complication, without  long-term current use of insulin  (HCC)   [place] [facility] Celestia Rosaline SQUIBB, NP   1 year ago Depression with anxiety   [place] [facility] Celestia Rosaline SQUIBB, NP   1 year ago Type 2 diabetes mellitus with diabetic neuropathy, without long-term current use of insulin  (HCC)   [place] [facility] Celestia Rosaline SQUIBB, NP   2 years ago Type 2 diabetes mellitus with diabetic neuropathy, without long-term current use of insulin  [facility])   [place] [facility], Bascom RAMAN, NP   3 years ago Type 2 diabetes mellitus with diabetic neuropathy, unspecified whether long term insulin  use (HCC)   [place] [facility], Cari S, PA-C       Future Appointments             In 5 days Celestia Rosaline SQUIBB, NP [place] [facility]             gabapentin  (NEURONTIN ) 300 MG capsule 180 capsule 1    Sig: Take 1 capsule (300 mg total) by mouth 2 (two) times daily.     Neurology: Anticonvulsants - gabapentin  Failed - 02/15/2023  7:42 AM      Failed - Cr in normal range and within 360 days    Creatinine, Ser  Date Value Ref Range Status  01/20/2022 1.06 (H) 0.44 - 1.00 mg/dL Final         Passed - Completed PHQ-2 or PHQ-9 in the last 360 days      Passed - Valid encounter within last 12 months    Recent Outpatient Visits           9 months ago Type 2 diabetes mellitus with other circulatory complication, without long-term current use of insulin  (HCC)   [place] [facility] Celestia Rosaline SQUIBB, NP   1 year ago Depression with anxiety   [place] [facility] Celestia Rosaline SQUIBB, NP   1 year ago Type 2 diabetes mellitus with diabetic neuropathy, without long-term current use of insulin  (HCC)   [place] [facility] Celestia Rosaline SQUIBB, NP   2 years ago Type 2 diabetes mellitus with diabetic neuropathy, without  long-term current use of insulin  [facility])   [place] [facility], Bascom RAMAN, NP   3 years ago Type 2 diabetes mellitus with diabetic neuropathy, unspecified whether long term insulin  use (HCC)   [place] [facility], Kirk RAMAN, PA-C       Future Appointments             In 5 days Celestia Rosaline SQUIBB, NP [place] [facility]             valsartan -hydrochlorothiazide  (DIOVAN -HCT) 160-25 MG tablet 90 tablet 1    Sig: Take 1 tablet by mouth daily.     Cardiovascular: ARB + Diuretic Combos Failed - 02/15/2023  7:42 AM      Failed - K in normal range and within 180 days    Potassium  Date Value Ref Range Status  01/20/2022 3.2 (L) 3.5 - 5.1 mmol/L Final         Failed - Na in normal range and within 180 days    Sodium  Date Value Ref Range Status  01/20/2022 139 135 - 145 mmol/L Final  06/15/2020 141 134 - 144 mmol/L Final         Failed - Cr in normal range and within 180 days    Creatinine, Ser  Date Value Ref Range Status  01/20/2022 1.06 (H) 0.44 - 1.00 mg/dL Final         Failed - eGFR is 10 or above and within 180 days    GFR calc Af Amer  Date Value Ref Range Status  08/17/2019 82 >59 mL/min/1.73 Final    Comment:    **[facility] currently reports eGFR in compliance with the current**   recommendations of the [employer]. [facility] will   update reporting as new guidelines are published from the NKF-ASN   Task force.    GFR, Estimated  Date Value Ref Range Status  01/20/2022 >60 >60 mL/min Final    Comment:    (NOTE) Calculated using the CKD-EPI Creatinine Equation (2021)    eGFR  Date Value Ref Range Status  06/15/2020 95 >59 mL/min/1.73 Final         Failed - Valid encounter within last 6 months    Recent Outpatient Visits           9 months ago Type 2 diabetes mellitus with other circulatory complication, without long-term current use of insulin  (HCC)   [place]  [facility] Celestia Rosaline SQUIBB, NP   1 year ago Depression with anxiety   [place] [facility] Celestia Rosaline SQUIBB, NP   1 year ago Type 2 diabetes mellitus with diabetic neuropathy, without long-term current use of insulin  (HCC)   [place] [facility] Celestia Rosaline SQUIBB, NP   2 years ago Type 2 diabetes mellitus with diabetic neuropathy, without long-term current use of insulin  (HCC)   [place] [facility], Bascom RAMAN, NP   3 years ago Type 2 diabetes mellitus with diabetic neuropathy, unspecified whether long term insulin  use (HCC)   [place] [facility], Kirk RAMAN, PA-C       Future Appointments             In 5 days Celestia Rosaline SQUIBB, NP [place] [facility]            Passed - Patient is not pregnant      Passed - Last BP in normal range    BP Readings from Last 1 Encounters:  04/25/22 137/89          metFORMIN  (GLUCOPHAGE ) 500 MG tablet 180 tablet 1    Sig: Take 1 tablet (500 mg total) by mouth 2 (two) times daily with a meal.     Endocrinology:  Diabetes - Biguanides Failed - 02/15/2023  7:42 AM      Failed - Cr in normal range and within 360 days    Creatinine, Ser  Date Value Ref Range Status  01/20/2022 1.06 (H) 0.44 - 1.00 mg/dL Final         Failed - HBA1C is between 0 and 7.9 and within 180 days    HbA1c, POC (controlled diabetic range)  Date Value Ref Range Status  04/25/2022 6.4 0.0 - 7.0 % Final         Failed - eGFR in normal range and within 360 days    GFR calc Af Amer  Date Value Ref Range Status  08/17/2019 82 >59 mL/min/1.73 Final    Comment:    **[facility] currently reports eGFR in compliance with the current**   recommendations of the [employer]. [facility] will   update reporting as new guidelines are published from the NKF-ASN   Task force.    GFR, Estimated  Date Value Ref Range Status   01/20/2022 >60 >60 mL/min Final    Comment:    (NOTE) Calculated using the CKD-EPI Creatinine Equation (2021)    eGFR  Date Value Ref Range Status  06/15/2020 95 >59 mL/min/1.73 Final         Failed - B12 Level in normal range and within 720 days    No results found for: VITAMINB12       Failed - Valid encounter within last 6 months    Recent Outpatient Visits           9 months ago  Type 2 diabetes mellitus with other circulatory complication, without long-term current use of insulin  (HCC)   [place] [facility] Celestia Rosaline SQUIBB, NP   1 year ago Depression with anxiety   [place] [facility] Celestia Rosaline SQUIBB, NP   1 year ago Type 2 diabetes mellitus with diabetic neuropathy, without long-term current use of insulin  (HCC)   [place] [facility] Celestia Rosaline SQUIBB, NP   2 years ago Type 2 diabetes mellitus with diabetic neuropathy, without long-term current use of insulin  (HCC)   [place] [facility], Bascom RAMAN, NP   3 years ago Type 2 diabetes mellitus with diabetic neuropathy, unspecified whether long term insulin  use (HCC)   [place] [facility], Kirk RAMAN, PA-C       Future Appointments             In 5 days Celestia Rosaline SQUIBB, NP [place] [facility]            Failed - CBC within normal limits and completed in the last 12 months    WBC  Date Value Ref Range Status  01/20/2022 5.8 4.0 - 10.5 K/uL Final    Comment:    WHITE COUNT CONFIRMED ON SMEAR   RBC  Date Value Ref Range Status  01/20/2022 3.98 3.87 - 5.11 MIL/uL Final   Hemoglobin  Date Value Ref Range Status  01/20/2022 12.1 12.0 - 15.0 g/dL Final  94/88/7977 86.7 11.1 - 15.9 g/dL Final   HCT  Date Value Ref Range Status  01/20/2022 35.5 (L) 36.0 - 46.0 % Final   Hematocrit  Date Value Ref Range Status  06/15/2020 37.3 34.0 - 46.6 % Final   MCHC  Date  Value Ref Range Status  01/20/2022 34.1 30.0 - 36.0 g/dL Final   [facility]  Date Value Ref Range Status  01/20/2022 30.4 26.0 - 34.0 pg Final   MCV  Date Value Ref Range Status  01/20/2022 89.2 80.0 - 100.0 fL Final  06/15/2020 86 79 - 97 fL Final   No results found for: PLTCOUNTKUC, LABPLAT, POCPLA RDW  Date Value Ref Range Status  01/20/2022 16.9 (H) 11.5 - 15.5 % Final  06/15/2020 16.0 (H) 11.7 - 15.4 % Final         Signed Prescriptions Disp Refills   amLODipine  (NORVASC ) 10 MG tablet 30 tablet 0    Sig: Take 1 tablet (10 mg total) by mouth daily.     Cardiovascular: Calcium  Channel Blockers 2 Failed - 02/15/2023  7:42 AM      Failed - Valid encounter within last 6 months    Recent Outpatient Visits           9 months ago Type 2 diabetes mellitus with other circulatory complication, without long-term current use of insulin  (HCC)   [place] [facility] Celestia Rosaline SQUIBB, NP   1 year ago Depression with anxiety   [place] [facility] Celestia Rosaline SQUIBB, NP   1 year ago Type 2 diabetes mellitus with diabetic neuropathy, without long-term current use of insulin  (HCC)   [place] [facility] Celestia Rosaline SQUIBB, NP   2 years ago Type 2 diabetes mellitus with diabetic neuropathy, without long-term current use of insulin  [facility])   [place] [facility], Bascom RAMAN, NP   3 years ago Type 2 diabetes mellitus with diabetic neuropathy, unspecified whether long  term insulin  use [facility])   [place] [facility], Kirk RAMAN, PA-C       Future Appointments             In 5 days Celestia Rosaline SQUIBB, NP [place] [facility]            Passed - Last BP in normal range    BP Readings from Last 1 Encounters:  04/25/22 137/89         Passed - Last Heart Rate in normal range    Pulse Readings from Last 1 Encounters:  04/25/22 85

## 2023-02-15 NOTE — Telephone Encounter (Signed)
 Requested Prescriptions  Pending Prescriptions Disp Refills   amLODipine  (NORVASC ) 10 MG tablet 30 tablet 0    Sig: Take 1 tablet (10 mg total) by mouth daily.     Cardiovascular: Calcium  Channel Blockers 2 Failed - 02/15/2023  7:41 AM      Failed - Valid encounter within last 6 months    Recent Outpatient Visits           9 months ago Type 2 diabetes mellitus with other circulatory complication, without long-term current use of insulin  (HCC)   [place] [facility] Celestia Rosaline SQUIBB, NP   1 year ago Depression with anxiety   [place] [facility] Celestia Rosaline SQUIBB, NP   1 year ago Type 2 diabetes mellitus with diabetic neuropathy, without long-term current use of insulin  (HCC)   [place] [facility] Celestia Rosaline SQUIBB, NP   2 years ago Type 2 diabetes mellitus with diabetic neuropathy, without long-term current use of insulin  [facility])   [place] [facility], Bascom RAMAN, NP   3 years ago Type 2 diabetes mellitus with diabetic neuropathy, unspecified whether long term insulin  use (HCC)   [place] [facility], Kirk RAMAN, PA-C       Future Appointments             In 5 days Celestia Rosaline SQUIBB, NP [place] [facility]            Passed - Last BP in normal range    BP Readings from Last 1 Encounters:  04/25/22 137/89         Passed - Last Heart Rate in normal range    Pulse Readings from Last 1 Encounters:  04/25/22 85          atorvastatin  (LIPITOR) 40 MG tablet 30 tablet 3    Sig: Take 1 tablet (40 mg total) by mouth daily.     Cardiovascular:  Antilipid - Statins Failed - 02/15/2023  7:41 AM      Failed - Lipid Panel in normal range within the last 12 months    Cholesterol, Total  Date Value Ref Range Status  06/15/2020 151 100 - 199 mg/dL Final   Cholesterol  Date Value Ref Range Status  07/12/2021 154 0 - 200 mg/dL Final   LDL Chol  Calc (NIH)  Date Value Ref Range Status  06/15/2020 78 0 - 99 mg/dL Final   LDL Cholesterol  Date Value Ref Range Status  07/12/2021 72 0 - 99 mg/dL Final    Comment:           Total Cholesterol/HDL:CHD Risk Coronary Heart Disease Risk Table                     Men   Women  1/2 Average Risk   3.4   3.3  Average Risk       5.0   4.4  2 X Average Risk   9.6   7.1  3 X Average Risk  23.4   11.0        Use the calculated Patient Ratio above and the CHD Risk Table to determine the patient's CHD Risk.        ATP III CLASSIFICATION (LDL):  <100     mg/dL   Optimal  899-870  mg/dL   Near or Above  Optimal  130-159  mg/dL   Borderline  839-810  mg/dL   High  >809     mg/dL   Very High Performed at [facility], [address] [place], [place] [address]    HDL  Date Value Ref Range Status  07/12/2021 37 (L) >40 mg/dL Final  94/88/7977 37 (L) >39 mg/dL Final   Triglycerides  Date Value Ref Range Status  07/12/2021 223 (H) <150 mg/dL Final         Passed - Patient is not pregnant      Passed - Valid encounter within last 12 months    Recent Outpatient Visits           9 months ago Type 2 diabetes mellitus with other circulatory complication, without long-term current use of insulin  (HCC)   [place] [facility] Celestia Rosaline SQUIBB, NP   1 year ago Depression with anxiety   [place] [facility] Celestia Rosaline SQUIBB, NP   1 year ago Type 2 diabetes mellitus with diabetic neuropathy, without long-term current use of insulin  (HCC)   [place] [facility] Celestia Rosaline SQUIBB, NP   2 years ago Type 2 diabetes mellitus with diabetic neuropathy, without long-term current use of insulin  [facility])   [place] [facility], Bascom RAMAN, NP   3 years ago Type 2 diabetes mellitus with diabetic neuropathy, unspecified whether long term insulin  use (HCC)   [place] [facility], Cari S, PA-C       Future Appointments             In 5 days Celestia Rosaline SQUIBB, NP [place] [facility]             gabapentin  (NEURONTIN ) 300 MG capsule 180 capsule 1    Sig: Take 1 capsule (300 mg total) by mouth 2 (two) times daily.     Neurology: Anticonvulsants - gabapentin  Failed - 02/15/2023  7:41 AM      Failed - Cr in normal range and within 360 days    Creatinine, Ser  Date Value Ref Range Status  01/20/2022 1.06 (H) 0.44 - 1.00 mg/dL Final         Passed - Completed PHQ-2 or PHQ-9 in the last 360 days      Passed - Valid encounter within last 12 months    Recent Outpatient Visits           9 months ago Type 2 diabetes mellitus with other circulatory complication, without long-term current use of insulin  [facility])   [place] [facility] Celestia Rosaline SQUIBB, NP   1 year ago Depression with anxiety   [place] [facility] Celestia Rosaline SQUIBB, NP   1 year ago Type 2 diabetes mellitus with diabetic neuropathy, without long-term current use of insulin  (HCC)   [place] [facility] Celestia Rosaline SQUIBB, NP   2 years ago Type 2 diabetes mellitus with diabetic neuropathy, without long-term current use of insulin  [facility])   [place] [facility], Bascom RAMAN, NP   3 years ago Type 2 diabetes mellitus with diabetic neuropathy, unspecified whether long term insulin  use [facility])   [place] [facility], Kirk RAMAN, [place]       Future Appointments             In 5 days Celestia Rosaline SQUIBB, NP [facility]             valsartan -hydrochlorothiazide  (DIOVAN -HCT) 160-25 MG tablet 90 tablet 1    Sig: Take 1 tablet by mouth daily.     Cardiovascular: ARB + Diuretic Combos Failed - 02/15/2023  7:41 AM      Failed - K in normal range and within 180 days    Potassium  Date Value Ref Range Status  01/20/2022  3.2 (L) 3.5 - 5.1 mmol/L Final         Failed - Na in normal range and within 180 days    Sodium  Date Value Ref Range Status  01/20/2022 139 135 - 145 mmol/L Final  06/15/2020 141 134 - 144 mmol/L Final         Failed - Cr in normal range and within 180 days    Creatinine, Ser  Date Value Ref Range Status  01/20/2022 1.06 (H) 0.44 - 1.00 mg/dL Final         Failed - eGFR is 10 or above and within 180 days    GFR calc Af Amer  Date Value Ref Range Status  08/17/2019 82 >59 mL/min/1.73 Final    Comment:    **[facility] currently reports eGFR in compliance with the current**   recommendations of the [employer]. [facility] will   update reporting as new guidelines are published from the NKF-ASN   Task force.    GFR, Estimated  Date Value Ref Range Status  01/20/2022 >60 >60 mL/min Final    Comment:    (NOTE) Calculated using the CKD-EPI Creatinine Equation (2021)    eGFR  Date Value Ref Range Status  06/15/2020 95 >59 mL/min/1.73 Final         Failed - Valid encounter within last 6 months    Recent Outpatient Visits           9 months ago Type 2 diabetes mellitus with other circulatory complication, without long-term current use of insulin  (HCC)   [place] [facility] Celestia Rosaline SQUIBB, NP   1 year ago Depression with anxiety   [place] [facility] Celestia Rosaline SQUIBB, NP   1 year ago Type 2 diabetes mellitus with diabetic neuropathy, without long-term current use of insulin  (HCC)   [place] [facility] Celestia Rosaline SQUIBB, NP   2 years ago Type 2 diabetes mellitus with diabetic neuropathy, without long-term current use of insulin  (HCC)   [place] [facility], Bascom RAMAN, NP   3 years ago Type 2 diabetes mellitus with diabetic neuropathy, unspecified whether long term insulin  use (HCC)   [place] [facility], Cari S, PA-C       Future  Appointments             In 5 days Celestia Rosaline SQUIBB, NP [place] [facility]            Passed - Patient is not pregnant      Passed - Last BP in normal range    BP Readings from Last 1 Encounters:  04/25/22 137/89          metFORMIN  (GLUCOPHAGE ) 500 MG tablet 180 tablet 1    Sig: Take 1 tablet (500 mg total) by mouth 2 (two) times daily with a meal.     Endocrinology:  Diabetes - Biguanides Failed - 02/15/2023  7:41 AM      Failed -  Cr in normal range and within 360 days    Creatinine, Ser  Date Value Ref Range Status  01/20/2022 1.06 (H) 0.44 - 1.00 mg/dL Final         Failed - HBA1C is between 0 and 7.9 and within 180 days    HbA1c, POC (controlled diabetic range)  Date Value Ref Range Status  04/25/2022 6.4 0.0 - 7.0 % Final         Failed - eGFR in normal range and within 360 days    GFR calc Af Amer  Date Value Ref Range Status  08/17/2019 82 >59 mL/min/1.73 Final    Comment:    **[facility] currently reports eGFR in compliance with the current**   recommendations of the [employer]. [facility] will   update reporting as new guidelines are published from the NKF-ASN   Task force.    GFR, Estimated  Date Value Ref Range Status  01/20/2022 >60 >60 mL/min Final    Comment:    (NOTE) Calculated using the CKD-EPI Creatinine Equation (2021)    eGFR  Date Value Ref Range Status  06/15/2020 95 >59 mL/min/1.73 Final         Failed - B12 Level in normal range and within 720 days    No results found for: VITAMINB12       Failed - Valid encounter within last 6 months    Recent Outpatient Visits           9 months ago Type 2 diabetes mellitus with other circulatory complication, without long-term current use of insulin  (HCC)   [place] [facility] Celestia Rosaline SQUIBB, NP   1 year ago Depression with anxiety   [place] [facility] Celestia Rosaline SQUIBB, NP   1 year ago Type 2 diabetes  mellitus with diabetic neuropathy, without long-term current use of insulin  (HCC)   [place] [facility] Celestia Rosaline SQUIBB, NP   2 years ago Type 2 diabetes mellitus with diabetic neuropathy, without long-term current use of insulin  (HCC)   [place] [facility], Bascom S, NP   3 years ago Type 2 diabetes mellitus with diabetic neuropathy, unspecified whether long term insulin  use (HCC)   [place] [facility], Kirk RAMAN, PA-C       Future Appointments             In 5 days Celestia Rosaline SQUIBB, NP [place] [facility]            Failed - CBC within normal limits and completed in the last 12 months    WBC  Date Value Ref Range Status  01/20/2022 5.8 4.0 - 10.5 K/uL Final    Comment:    WHITE COUNT CONFIRMED ON SMEAR   RBC  Date Value Ref Range Status  01/20/2022 3.98 3.87 - 5.11 MIL/uL Final   Hemoglobin  Date Value Ref Range Status  01/20/2022 12.1 12.0 - 15.0 g/dL Final  94/88/7977 86.7 11.1 - 15.9 g/dL Final   HCT  Date Value Ref Range Status  01/20/2022 35.5 (L) 36.0 - 46.0 % Final   Hematocrit  Date Value Ref Range Status  06/15/2020 37.3 34.0 - 46.6 % Final   MCHC  Date Value Ref Range Status  01/20/2022 34.1 30.0 - 36.0 g/dL Final   [facility]  Date Value Ref Range Status  01/20/2022 30.4 26.0 - 34.0 pg Final   MCV  Date Value  Ref Range Status  01/20/2022 89.2 80.0 - 100.0 fL Final  06/15/2020 86 79 - 97 fL Final   No results found for: PLTCOUNTKUC, LABPLAT, POCPLA RDW  Date Value Ref Range Status  01/20/2022 16.9 (H) 11.5 - 15.5 % Final  06/15/2020 16.0 (H) 11.7 - 15.4 % Final

## 2023-02-20 ENCOUNTER — Ambulatory Visit (INDEPENDENT_AMBULATORY_CARE_PROVIDER_SITE_OTHER): Payer: 59 | Admitting: Primary Care

## 2023-02-20 ENCOUNTER — Encounter (INDEPENDENT_AMBULATORY_CARE_PROVIDER_SITE_OTHER): Payer: Self-pay | Admitting: Primary Care

## 2023-02-20 VITALS — BP 231/135 | HR 95 | Resp 16 | Ht 59.0 in | Wt 128.8 lb

## 2023-02-20 DIAGNOSIS — Z7984 Long term (current) use of oral hypoglycemic drugs: Secondary | ICD-10-CM

## 2023-02-20 DIAGNOSIS — E782 Mixed hyperlipidemia: Secondary | ICD-10-CM

## 2023-02-20 DIAGNOSIS — Z1211 Encounter for screening for malignant neoplasm of colon: Secondary | ICD-10-CM

## 2023-02-20 DIAGNOSIS — I10 Essential (primary) hypertension: Secondary | ICD-10-CM | POA: Diagnosis not present

## 2023-02-20 DIAGNOSIS — Z2821 Immunization not carried out because of patient refusal: Secondary | ICD-10-CM

## 2023-02-20 DIAGNOSIS — E876 Hypokalemia: Secondary | ICD-10-CM | POA: Diagnosis not present

## 2023-02-20 DIAGNOSIS — Z5941 Food insecurity: Secondary | ICD-10-CM

## 2023-02-20 DIAGNOSIS — E1159 Type 2 diabetes mellitus with other circulatory complications: Secondary | ICD-10-CM

## 2023-02-20 DIAGNOSIS — Z5982 Transportation insecurity: Secondary | ICD-10-CM

## 2023-02-20 DIAGNOSIS — I160 Hypertensive urgency: Secondary | ICD-10-CM

## 2023-02-20 DIAGNOSIS — Z1231 Encounter for screening mammogram for malignant neoplasm of breast: Secondary | ICD-10-CM

## 2023-02-20 MED ORDER — ALBUTEROL SULFATE HFA 108 (90 BASE) MCG/ACT IN AERS: 2.0000 | INHALATION_SPRAY | Freq: Four times a day (QID) | RESPIRATORY_TRACT | 2 refills | Status: DC | PRN

## 2023-02-20 MED ORDER — CARVEDILOL 12.5 MG PO TABS: 12.5000 mg | ORAL_TABLET | Freq: Two times a day (BID) | ORAL | 1 refills | Status: DC

## 2023-02-20 MED ORDER — AMLODIPINE BESYLATE 10 MG PO TABS: 10.0000 mg | ORAL_TABLET | Freq: Every day | ORAL | 1 refills | Status: DC

## 2023-02-20 MED ORDER — GABAPENTIN 300 MG PO CAPS: 300.0000 mg | ORAL_CAPSULE | Freq: Two times a day (BID) | ORAL | 1 refills | Status: DC

## 2023-02-20 MED ORDER — ATORVASTATIN CALCIUM 40 MG PO TABS: 40.0000 mg | ORAL_TABLET | Freq: Every day | ORAL | 1 refills | Status: DC

## 2023-02-20 MED ORDER — METFORMIN HCL 500 MG PO TABS: 500.0000 mg | ORAL_TABLET | Freq: Two times a day (BID) | ORAL | 1 refills | Status: DC

## 2023-02-20 MED ORDER — VALSARTAN-HYDROCHLOROTHIAZIDE 160-25 MG PO TABS: 1.0000 | ORAL_TABLET | Freq: Every day | ORAL | 1 refills | Status: DC

## 2023-02-20 NOTE — Progress Notes (Signed)
Subjective:  Patient ID: Stephanie [name], female    DOB: Oct 18, 1970  Age: 53 y.o. MRN: [id number]  CC: Diabetes and Hypertension (Been out of medication for 2 weeks )   Stephanie [name] presents forFollow-up of diabetes. Patient does not check blood sugar at home Diabetes She has type 2 diabetes mellitus. No MedicAlert identification noted. Hypoglycemia symptoms include headaches. Pertinent negatives for hypoglycemia include no dizziness. Associated symptoms include polydipsia and polyuria. There are no hypoglycemic complications. Diabetic complications include a CVA. (hx) Risk factors for coronary artery disease include diabetes mellitus, hypertension and dyslipidemia. When asked about current treatments, none were reported. She is compliant with treatment none of the time.  Hypertension This is a chronic problem. The current episode started more than 1 year ago. Progression since onset: out of medication 3 weeks. Associated symptoms include anxiety, headaches and shortness of breath. Agents associated with hypertension include NSAIDs (back pain). Risk factors for coronary artery disease include diabetes mellitus, dyslipidemia and smoking/tobacco exposure. Past treatments include calcium channel blockers, beta blockers, direct vasodilators and ACE inhibitors. Hypertensive end-organ damage includes CVA.    Compliant with meds - No Checking CBGs? No  Fasting avg -   Postprandial average -  Exercising regularly? - Yes Watching carbohydrate intake? - Yes Neuropathy ? - No Hypoglycemic events - No  - Recovers with :   Pertinent ROS:  Polyuria - Yes Polydipsia - Yes Vision problems - Yes blurry   Medications as noted below. Taking them regularly without complication/adverse reaction being reported today.   History Zailynn has a past medical history of Diabetes mellitus without complication (HCC), High cholesterol, and Hypertension.   She has no past surgical history on file.    Her family history includes Diabetes in her mother; Hypertension in her mother.She reports that she has been smoking cigarettes. She has never used smokeless tobacco. She reports current alcohol use of about 2.0 standard drinks of alcohol per week. She reports current drug use. Drug: Marijuana.  Current Outpatient Medications on File Prior to Visit  Medication Sig Dispense Refill   albuterol (VENTOLIN HFA) 108 (90 Base) MCG/ACT inhaler INHALE 2 PUFFS INTO THE LUNGS EVERY 6 (SIX) HOURS AS NEEDED FOR WHEEZING OR SHORTNESS OF BREATH. 6.7 g 2   amLODipine (NORVASC) 10 MG tablet Take 1 tablet (10 mg total) by mouth daily. 30 tablet 0   atorvastatin (LIPITOR) 40 MG tablet Take 1 tablet (40 mg total) by mouth daily. 30 tablet 3   carvedilol (COREG) 12.5 MG tablet Take 1 tablet (12.5 mg total) by mouth 2 (two) times daily with a meal. 180 tablet 1   gabapentin (NEURONTIN) 300 MG capsule Take 1 capsule (300 mg total) by mouth 2 (two) times daily. 180 capsule 1   metFORMIN (GLUCOPHAGE) 500 MG tablet Take 1 tablet (500 mg total) by mouth 2 (two) times daily with a meal. 180 tablet 1   naproxen (NAPROSYN) 500 MG tablet Take 1 tablet (500 mg total) by mouth 2 (two) times daily. 14 tablet 0   potassium chloride (KLOR-CON M) 10 MEQ tablet Take 2 tablets (20 mEq total) by mouth daily. 90 tablet 0   TRUE METRIX BLOOD GLUCOSE TEST test strip Use as instructed. Check blood glucose level by fingerstick twice per day. 100 each 1   TRUEplus Lancets 28G MISC Use as instructed. Check blood glucose level by fingerstick twice per day. 100 each 2   valsartan-hydrochlorothiazide (DIOVAN-HCT) 160-25 MG tablet Take 1 tablet by mouth daily. 90 tablet  1   No current facility-administered medications on file prior to visit.    Review of Systems  Respiratory:  Positive for shortness of breath.   Endocrine: Positive for polydipsia and polyuria.  Neurological:  Positive for headaches. Negative for dizziness.   Comprehensive  ROS Pertinent positive and negative noted in HPI   Objective:  BP (!) 231/135 (BP Location: Right Arm, Patient Position: Sitting, Cuff Size: Normal)   Pulse 95   Resp 16   Ht 4\' 11"  (1.499 m)   Wt 128 lb 12.8 oz (58.4 kg)   SpO2 100%   BMI 26.01 kg/m   BP Readings from Last 3 Encounters:  02/20/23 (!) 231/135  04/25/22 137/89  01/20/22 (!) 182/112    Wt Readings from Last 3 Encounters:  02/20/23 128 lb 12.8 oz (58.4 kg)  04/25/22 115 lb 12.8 oz (52.5 kg)  01/20/22 120 lb (54.4 kg)    Physical Exam Vitals reviewed.  Constitutional:      Appearance: Normal appearance. She is normal weight.  HENT:     Head: Normocephalic.     Right Ear: Tympanic membrane and external ear normal.     Left Ear: Tympanic membrane and external ear normal.     Nose: Nose normal.  Eyes:     Extraocular Movements: Extraocular movements intact.     Pupils: Pupils are equal, round, and reactive to light.  Cardiovascular:     Rate and Rhythm: Normal rate and regular rhythm.  Pulmonary:     Effort: Pulmonary effort is normal.     Breath sounds: Normal breath sounds.  Abdominal:     General: Bowel sounds are normal.     Palpations: Abdomen is soft.  Musculoskeletal:        General: Normal range of motion.     Cervical back: Normal range of motion.  Skin:    General: Skin is warm and dry.  Neurological:     Mental Status: She is alert and oriented to person, place, and time.  Psychiatric:        Mood and Affect: Mood normal.        Behavior: Behavior normal.        Thought Content: Thought content normal.        Judgment: Judgment normal.     Lab Results  Component Value Date   HGBA1C 6.4 04/25/2022   HGBA1C 6.5 11/16/2021   HGBA1C 7.9 (H) 07/12/2021    Lab Results  Component Value Date   WBC 5.8 01/20/2022   HGB 12.1 01/20/2022   HCT 35.5 (L) 01/20/2022   PLT 225 01/20/2022   GLUCOSE 264 (H) 01/20/2022   CHOL 154 07/12/2021   TRIG 223 (H) 07/12/2021   HDL 37 (L) 07/12/2021    LDLCALC 72 07/12/2021   ALT 26 07/11/2021   AST 26 07/11/2021   NA 139 01/20/2022   K 3.2 (L) 01/20/2022   CL 107 01/20/2022   CREATININE 1.06 (H) 01/20/2022   BUN 12 01/20/2022   CO2 26 01/20/2022   TSH 1.299 11/18/2017   HGBA1C 6.4 04/25/2022     Assessment & Plan:  Darcie was seen today for diabetes and hypertension.  Diagnoses and all orders for this visit:  Type 2 diabetes mellitus with other circulatory complication, without long-term current use of insulin (HCC) -     CMP14+EGFR -     CBC with Differential/Platelet -     Hemoglobin A1c  -     Microalbumin / creatinine urine ratio -  metFORMIN (GLUCOPHAGE) 500 MG tablet; Take 1 tablet (500 mg total) by mouth 2 (two) times daily with a meal. -     Ambulatory referral to Ophthalmology  Essential hypertension -     CMP14+EGFR -     CBC with Differential/Platelet -     amLODipine (NORVASC) 10 MG tablet; Take 1 tablet (10 mg total) by mouth daily. -     carvedilol (COREG) 12.5 MG tablet; Take 1 tablet (12.5 mg total) by mouth 2 (two) times daily with a meal. -     valsartan-hydrochlorothiazide (DIOVAN-HCT) 160-25 MG tablet; Take 1 tablet by mouth daily.  Mixed hyperlipidemia -     Lipid panel -     atorvastatin (LIPITOR) 40 MG tablet; Take 1 tablet (40 mg total) by mouth daily.  Hypokalemia  Food insecurity -     AMB Referral [facility] Care Management  Transportation insecurity -     AMB Referral VBCI Care Management  Pneumococcal vaccination declined  Influenza vaccination declined  Hypertensive urgency  Colon cancer screening -     Cologuard  Encounter for screening mammogram for malignant neoplasm of breast -     MM 3D SCREENING MAMMOGRAM BILATERAL BREAST; Future  Other orders -     albuterol (VENTOLIN HFA) 108 (90 Base) MCG/ACT inhaler; INHALE 2 PUFFS INTO THE LUNGS EVERY 6 (SIX) HOURS AS NEEDED FOR WHEEZING OR SHORTNESS OF BREATH. -     gabapentin (NEURONTIN) 300 MG capsule; Take 1 capsule (300 mg  total) by mouth 2 (two) times daily.      Follow-up:  No follow-ups on file.  The above assessment and management plan was discussed with the patient. The patient verbalized understanding of and has agreed to the management plan. Patient is aware to call the clinic if symptoms fail to improve or worsen. Patient is aware when to return to the clinic for a follow-up visit. Patient educated on when it is appropriate to go to the emergency department.   [name], NP-C

## 2023-02-22 LAB — CBC WITH DIFFERENTIAL/PLATELET
Basophils Absolute: 0 10*3/uL (ref 0.0–0.2)
Basos: 0 %
EOS (ABSOLUTE): 0.1 10*3/uL (ref 0.0–0.4)
Eos: 1 %
Hematocrit: 35.6 % (ref 34.0–46.6)
Hemoglobin: 12.2 g/dL (ref 11.1–15.9)
Immature Grans (Abs): 0.1 10*3/uL (ref 0.0–0.1)
Immature Granulocytes: 1 %
Lymphocytes Absolute: 2.2 10*3/uL (ref 0.7–3.1)
Lymphs: 31 %
MCH: 30.8 pg (ref 26.6–33.0)
MCHC: 34.3 g/dL (ref 31.5–35.7)
MCV: 90 fL (ref 79–97)
Monocytes Absolute: 0.4 10*3/uL (ref 0.1–0.9)
Monocytes: 6 %
Neutrophils Absolute: 4.5 10*3/uL (ref 1.4–7.0)
Neutrophils: 61 %
Platelets: 258 10*3/uL (ref 150–450)
RBC: 3.96 x10E6/uL (ref 3.77–5.28)
RDW: 15.6 % — ABNORMAL HIGH (ref 11.7–15.4)
WBC: 7.3 10*3/uL (ref 3.4–10.8)

## 2023-02-22 LAB — MICROALBUMIN / CREATININE URINE RATIO
Creatinine, Urine: 31 mg/dL
Microalb/Creat Ratio: 1031 mg/g{creat} — ABNORMAL HIGH (ref 0–29)
Microalbumin, Urine: 319.6 ug/mL

## 2023-02-22 LAB — CMP14+EGFR
ALT: 33 [IU]/L — ABNORMAL HIGH (ref 0–32)
AST: 24 [IU]/L (ref 0–40)
Albumin: 4 g/dL (ref 3.8–4.9)
Alkaline Phosphatase: 110 [IU]/L (ref 44–121)
BUN/Creatinine Ratio: 11 (ref 9–23)
BUN: 12 mg/dL (ref 6–24)
Bilirubin Total: 0.3 mg/dL (ref 0.0–1.2)
CO2: 20 mmol/L (ref 20–29)
Calcium: 8.6 mg/dL — ABNORMAL LOW (ref 8.7–10.2)
Chloride: 105 mmol/L (ref 96–106)
Creatinine, Ser: 1.07 mg/dL — ABNORMAL HIGH (ref 0.57–1.00)
Globulin, Total: 2.4 g/dL (ref 1.5–4.5)
Glucose: 220 mg/dL — ABNORMAL HIGH (ref 70–99)
Potassium: 4 mmol/L (ref 3.5–5.2)
Sodium: 139 mmol/L (ref 134–144)
Total Protein: 6.4 g/dL (ref 6.0–8.5)
eGFR: 62 mL/min/{1.73_m2} (ref >59)

## 2023-02-22 LAB — LIPID PANEL
Chol/HDL Ratio: 3.3 {ratio} (ref 0.0–4.4)
Cholesterol, Total: 173 mg/dL (ref 100–199)
HDL: 52 mg/dL (ref >39)
LDL Chol Calc (NIH): 93 mg/dL (ref 0–99)
Triglycerides: 160 mg/dL — ABNORMAL HIGH (ref 0–149)
VLDL Cholesterol Cal: 28 mg/dL (ref 5–40)

## 2023-02-22 LAB — HEMOGLOBIN A1C
Est. average glucose Bld gHb Est-mCnc: 137 mg/dL
Hgb A1c MFr Bld: 6.4 % — ABNORMAL HIGH (ref 4.8–5.6)

## 2023-03-12 ENCOUNTER — Ambulatory Visit
Admission: RE | Admit: 2023-03-12 | Discharge: 2023-03-12 | Disposition: A | Discharge status: Home / Self Care | Payer: 59 | Source: Ambulatory Visit | Attending: Primary Care | Admitting: Primary Care

## 2023-03-12 ENCOUNTER — Other Ambulatory Visit: Payer: Self-pay

## 2023-03-12 DIAGNOSIS — Z1231 Encounter for screening mammogram for malignant neoplasm of breast: Secondary | ICD-10-CM

## 2023-03-12 NOTE — Patient Instructions (Signed)
  Medicaid Managed Care   Unsuccessful Outreach Note  03/12/2023 Name: ED RAYSON MRN: [id number] DOB: Jun 08, 1970  Referred by: Celestia Rosaline SQUIBB, NP Reason for referral : High Risk Managed Medicaid (MM social work unsuccessful telephone outreach )   An unsuccessful telephone outreach was attempted today. The patient was referred to the case management team for assistance with care management and care coordination.   Follow Up Plan: A HIPAA compliant phone message was left for the patient providing contact information and requesting a return call.   Thersia Delene ROBINS, MHA [facility]  Managed [facility] Social Worker [phone]

## 2023-03-12 NOTE — Patient Outreach (Signed)
  Medicaid Managed Care   Unsuccessful Outreach Note  03/12/2023 Name: Stephanie [name] MRN: [id number] DOB: Jun 08, 1970  Referred by: Celestia Rosaline SQUIBB, NP Reason for referral : High Risk Managed Medicaid (MM social work unsuccessful telephone outreach )   An unsuccessful telephone outreach was attempted today. The patient was referred to the case management team for assistance with care management and care coordination.   Follow Up Plan: A HIPAA compliant phone message was left for the patient providing contact information and requesting a return call.   Thersia Delene ROBINS, MHA [facility]  Managed [facility] Social Worker [phone]

## 2023-03-18 ENCOUNTER — Other Ambulatory Visit: Payer: Self-pay | Admitting: Primary Care

## 2023-03-18 DIAGNOSIS — R928 Other abnormal and inconclusive findings on diagnostic imaging of breast: Secondary | ICD-10-CM

## 2023-03-20 ENCOUNTER — Ambulatory Visit (INDEPENDENT_AMBULATORY_CARE_PROVIDER_SITE_OTHER): Payer: 59 | Admitting: Primary Care

## 2023-03-20 ENCOUNTER — Encounter (INDEPENDENT_AMBULATORY_CARE_PROVIDER_SITE_OTHER): Payer: Self-pay

## 2023-03-20 VITALS — BP 150/90

## 2023-03-20 DIAGNOSIS — Z0130 Encounter for examination of blood pressure without abnormal findings: Secondary | ICD-10-CM

## 2023-03-20 NOTE — Progress Notes (Signed)
   Blood Pressure Recheck Visit  Name: Stephanie [name] MRN: [id number] Date of Birth: 1970/06/23  Kadience Macchi [facility] presents today for Blood Pressure recheck with clinical support staff.  Patient presented with her Cologuard kit not understanding what to do if needed and wanted to have a bowel movement while in the office to make sure it was done correctly.  This caused a great deal of tension and stress blood pressure remaining elevated 150/94.  She endorses she took all her medicine this morning.  She was given a patient back with the contents and it to take to [employer].  Patient stated I do not know how to do all this where to go or how to do.  She also does not have transportation.  Writer took the content in the bag and will be dropping off at [employer] during lunch. Reference #[id number]  BP Readings from Last 3 Encounters:  03/20/23 (!) 157/103  02/20/23 (!) 231/135  04/25/22 137/89    Current Outpatient Medications  Medication Sig Dispense Refill   albuterol (VENTOLIN HFA) 108 (90 Base) MCG/ACT inhaler INHALE 2 PUFFS INTO THE LUNGS EVERY 6 (SIX) HOURS AS NEEDED FOR WHEEZING OR SHORTNESS OF BREATH. 6.7 g 2   amLODipine (NORVASC) 10 MG tablet Take 1 tablet (10 mg total) by mouth daily. 90 tablet 1   atorvastatin (LIPITOR) 40 MG tablet Take 1 tablet (40 mg total) by mouth daily. 90 tablet 1   carvedilol (COREG) 12.5 MG tablet Take 1 tablet (12.5 mg total) by mouth 2 (two) times daily with a meal. 180 tablet 1   gabapentin (NEURONTIN) 300 MG capsule Take 1 capsule (300 mg total) by mouth 2 (two) times daily. 180 capsule 1   metFORMIN (GLUCOPHAGE) 500 MG tablet Take 1 tablet (500 mg total) by mouth 2 (two) times daily with a meal. 180 tablet 1   naproxen (NAPROSYN) 500 MG tablet Take 1 tablet (500 mg total) by mouth 2 (two) times daily. 14 tablet 0   potassium chloride (KLOR-CON M) 10 MEQ tablet Take 2 tablets (20 mEq total) by mouth daily. 90 tablet 0   TRUE METRIX BLOOD GLUCOSE TEST test  strip Use as instructed. Check blood glucose level by fingerstick twice per day. 100 each 1   TRUEplus Lancets 28G MISC Use as instructed. Check blood glucose level by fingerstick twice per day. 100 each 2   valsartan-hydrochlorothiazide (DIOVAN-HCT) 160-25 MG tablet Take 1 tablet by mouth daily. 90 tablet 1   No current facility-administered medications for this visit.    Hypertensive Medication Review: Patient states that they are taking all their hypertensive medications as prescribed and their last dose of hypertensive medications was this morning   Documentation of any medication adherence discrepancies: none  Provider Recommendation:  Spoke to Elmore and she stated: she will rec-check bp   Patient has been scheduled to follow up with nurse Bp check on medication   Patient has been given provider's recommendations and does not have any questions or concerns at this time. Patient will contact the office for any future questions or concerns.

## 2023-03-27 ENCOUNTER — Encounter (INDEPENDENT_AMBULATORY_CARE_PROVIDER_SITE_OTHER): Payer: Self-pay | Admitting: Primary Care

## 2023-03-27 LAB — COLOGUARD: COLOGUARD: NEGATIVE

## 2023-03-29 ENCOUNTER — Other Ambulatory Visit: Payer: 59

## 2023-04-01 ENCOUNTER — Other Ambulatory Visit: Payer: Self-pay

## 2023-04-01 NOTE — Patient Outreach (Signed)
  Medicaid Managed Care   Unsuccessful Outreach Note  04/01/2023 Name: [name] MRN: [id number] DOB: Jan 17, 1971  Referred by: Grayce Sessions, NP Reason for referral : High Risk Managed Medicaid (MM social work unsuccessful telephone outreach )   An unsuccessful telephone outreach was attempted today. The patient was referred to the case management team for assistance with care management and care coordination.   Follow Up Plan: A HIPAA compliant phone message was left for the patient providing contact information and requesting a return call.   Abelino Derrick, MHA [facility]  Managed [facility] Social Worker [phone]

## 2023-04-01 NOTE — Patient Instructions (Signed)
  Medicaid Managed Care   Unsuccessful Outreach Note  04/01/2023 Name: Stephanie [name] MRN: [id number] DOB: Jan 17, 1971  Referred by: Grayce Sessions, NP Reason for referral : High Risk Managed Medicaid (MM social work unsuccessful telephone outreach )   An unsuccessful telephone outreach was attempted today. The patient was referred to the case management team for assistance with care management and care coordination.   Follow Up Plan: A HIPAA compliant phone message was left for the patient providing contact information and requesting a return call.   Abelino Derrick, MHA [facility]  Managed [facility] Social Worker [phone]

## 2023-04-03 ENCOUNTER — Encounter (INDEPENDENT_AMBULATORY_CARE_PROVIDER_SITE_OTHER): Payer: Self-pay | Admitting: Primary Care

## 2023-04-03 ENCOUNTER — Ambulatory Visit (INDEPENDENT_AMBULATORY_CARE_PROVIDER_SITE_OTHER): Payer: 59 | Admitting: Primary Care

## 2023-04-03 VITALS — BP 170/96 | HR 84 | Wt 124.6 lb

## 2023-04-03 DIAGNOSIS — I10 Essential (primary) hypertension: Secondary | ICD-10-CM | POA: Diagnosis not present

## 2023-04-03 MED ORDER — CLONIDINE HCL 0.1 MG PO TABS
0.1000 mg | ORAL_TABLET | Freq: Once | ORAL | Status: AC
  Administered 2023-04-03: 0.1 mg via ORAL

## 2023-04-03 MED ORDER — CLONIDINE HCL 0.2 MG PO TABS: 0.2000 mg | ORAL_TABLET | Freq: Once | ORAL | Status: DC

## 2023-04-03 NOTE — Progress Notes (Signed)
 [facility]  Stephanie [name], is a 53 y.o. female  EGB:[id number]  PXT:[id number]  DOB - [birth year]  Chief Complaint  Patient presents with   Hypertension       Subjective:   Stephanie [name] is a 53 y.o. female here today for an acute visit. Patient has No headache, No chest pain, No abdominal pain - No Nausea, No new weakness tingling or numbness, No Cough - shortness of breath  Tx with clonidine .1mg  (X2)  She had [employer] yesterday and today she ate [employer] reviewed Label Sodium 360 mg d/w needing to read labels on all foods and no can foods, decrease pork, sodium intake  No problems updated.  Comprehensive ROS Pertinent positive and negative noted in HPI   Allergies  Allergen Reactions   Lisinopril Cough    Past Medical History:  Diagnosis Date   Diabetes mellitus without complication (HCC)    High cholesterol    Hypertension     Current Outpatient Medications on File Prior to Visit  Medication Sig Dispense Refill   albuterol (VENTOLIN HFA) 108 (90 Base) MCG/ACT inhaler INHALE 2 PUFFS INTO THE LUNGS EVERY 6 (SIX) HOURS AS NEEDED FOR WHEEZING OR SHORTNESS OF BREATH. 6.7 g 2   amLODipine (NORVASC) 10 MG tablet Take 1 tablet (10 mg total) by mouth daily. 90 tablet 1   atorvastatin (LIPITOR) 40 MG tablet Take 1 tablet (40 mg total) by mouth daily. 90 tablet 1   carvedilol (COREG) 12.5 MG tablet Take 1 tablet (12.5 mg total) by mouth 2 (two) times daily with a meal. 180 tablet 1   gabapentin (NEURONTIN) 300 MG capsule Take 1 capsule (300 mg total) by mouth 2 (two) times daily. 180 capsule 1   metFORMIN (GLUCOPHAGE) 500 MG tablet Take 1 tablet (500 mg total) by mouth 2 (two) times daily with a meal. 180 tablet 1   naproxen (NAPROSYN) 500 MG tablet Take 1 tablet (500 mg total) by mouth 2 (two) times daily. 14 tablet 0   potassium chloride (KLOR-CON M) 10 MEQ tablet Take 2 tablets (20 mEq total) by mouth daily. 90 tablet 0    TRUE METRIX BLOOD GLUCOSE TEST test strip Use as instructed. Check blood glucose level by fingerstick twice per day. 100 each 1   TRUEplus Lancets 28G MISC Use as instructed. Check blood glucose level by fingerstick twice per day. 100 each 2   valsartan-hydrochlorothiazide (DIOVAN-HCT) 160-25 MG tablet Take 1 tablet by mouth daily. 90 tablet 1   No current facility-administered medications on file prior to visit.   Health Maintenance  Topic Date Due   Eye exam for diabetics  Never done   Pap with HPV screening  Never done   Colon Cancer Screening  Never done   Complete foot exam   08/16/2020   COVID-19 Vaccine (1 - 2024-25 season) Never done   Flu Shot  05/06/2023*   Zoster (Shingles) Vaccine (1 of 2) 05/21/2023*   Pneumococcal Vaccination (2 of 2 - PCV) 02/20/2024*   Hemoglobin A1C  08/20/2023   Yearly kidney function blood test for diabetes  02/20/2024   Yearly kidney health urinalysis for diabetes  02/20/2024   Mammogram  03/11/2025   DTaP/Tdap/Td vaccine (2 - Td or Tdap) 02/27/2028   Hepatitis C Screening  Completed   HIV Screening  Completed   HPV Vaccine  Aged Out  *Topic was postponed. The date shown is not the original due date.    Objective:  Vitals:   04/03/23 1101 04/03/23 1147 04/03/23 1207  BP: (!) 189/109 (!) 190/102 (!) 170/96  Pulse: 84    SpO2: 100%    Weight: 124 lb 9.6 oz (56.5 kg)     BP Readings from Last 3 Encounters:  04/03/23 (!) 170/96  03/20/23 (!) 150/90  02/20/23 (!) 231/135      Physical Exam Vitals reviewed.  Constitutional:      Appearance: Normal appearance.  HENT:     Head: Normocephalic.     Right Ear: Tympanic membrane and external ear normal.     Left Ear: Tympanic membrane and external ear normal.     Nose: Nose normal.     Mouth/Throat:     Mouth: Mucous membranes are moist.  Eyes:     Extraocular Movements: Extraocular movements intact.  Cardiovascular:     Rate and Rhythm: Normal rate and regular rhythm.  Pulmonary:      Effort: Pulmonary effort is normal.     Breath sounds: Normal breath sounds.  Abdominal:     General: Bowel sounds are normal.     Palpations: Abdomen is soft.  Musculoskeletal:        General: Normal range of motion.     Cervical back: Normal range of motion.  Skin:    General: Skin is warm and dry.  Neurological:     Mental Status: She is alert and oriented to person, place, and time.  Psychiatric:        Mood and Affect: Mood normal.        Behavior: Behavior normal.        Thought Content: Thought content normal.       Assessment & Plan   Essential hypertension Patient was treated for hypertension urgency blood pressure was not taken in 30 minutes per protocol due to the fact that her Medicaid transportation stated they will be here in our and came in 15 minutes.  Blood pressure retaken minute decreased.  See HPI BP goal - < 130/80 Explained that having normal blood pressure is the goal and medications are helping to get to goal and maintain normal blood pressure. DIET: Limit salt intake, read nutrition labels to check salt content, limit fried and high fatty foods  Avoid using multisymptom OTC cold preparations that generally contain sudafed which can rise BP. Consult with pharmacist on best cold relief products to use for persons with HTN EXERCISE Discussed incorporating exercise such as walking - 30 minutes most days of the week and can do in 10 minute intervals    -     cloNIDine HCl     Patient have been counseled extensively about nutrition and exercise. Other issues discussed during this visit include: low cholesterol diet, weight control and daily exercise, foot care, annual eye examinations at Ophthalmology, importance of adherence with medications and regular follow-up. We also discussed long term complications of uncontrolled diabetes and hypertension.   Nurse visit Bp ck 2 wks  The patient was given clear instructions to go to ER or return to medical center if  symptoms don't improve, worsen or new problems develop. The patient verbalized understanding. The patient was told to call to get lab results if they haven't heard anything in the next week.   This note has been created with Education officer, environmental. Any transcriptional errors are unintentional.   Grayce Sessions, NP 04/03/2023, 12:10 PM

## 2023-04-10 ENCOUNTER — Other Ambulatory Visit: Payer: 59

## 2023-04-17 ENCOUNTER — Ambulatory Visit (INDEPENDENT_AMBULATORY_CARE_PROVIDER_SITE_OTHER): Payer: 59 | Admitting: Primary Care

## 2023-04-17 VITALS — BP 210/115 | HR 63 | Wt 126.6 lb

## 2023-04-17 DIAGNOSIS — I10 Essential (primary) hypertension: Secondary | ICD-10-CM | POA: Diagnosis not present

## 2023-04-17 MED ORDER — CLONIDINE HCL 0.1 MG PO TABS
0.1000 mg | ORAL_TABLET | Freq: Once | ORAL | Status: AC
  Administered 2023-04-17: 0.1 mg via ORAL

## 2023-04-17 MED ORDER — CLONIDINE HCL 0.1 MG PO TABS
0.1000 mg | ORAL_TABLET | Freq: Once | ORAL | Status: AC
Start: 2023-04-17 — End: 2023-04-17
  Administered 2023-04-17: 0.1 mg via ORAL

## 2023-04-17 NOTE — Progress Notes (Signed)
   Blood Pressure Recheck Visit  Name: Stephanie [name] MRN: [id number] Date of Birth: 12/15/1970  [name] [facility] presents today for Blood Pressure recheck with clinical support staff.  Order for BP recheck by Ethelle Lyon, ordered on 04/17/23.   BP Readings from Last 3 Encounters:  04/03/23 (!) 170/96  03/20/23 (!) 150/90  02/20/23 (!) 231/135    Current Outpatient Medications  Medication Sig Dispense Refill   albuterol (VENTOLIN HFA) 108 (90 Base) MCG/ACT inhaler INHALE 2 PUFFS INTO THE LUNGS EVERY 6 (SIX) HOURS AS NEEDED FOR WHEEZING OR SHORTNESS OF BREATH. 6.7 g 2   amLODipine (NORVASC) 10 MG tablet Take 1 tablet (10 mg total) by mouth daily. 90 tablet 1   atorvastatin (LIPITOR) 40 MG tablet Take 1 tablet (40 mg total) by mouth daily. 90 tablet 1   carvedilol (COREG) 12.5 MG tablet Take 1 tablet (12.5 mg total) by mouth 2 (two) times daily with a meal. 180 tablet 1   gabapentin (NEURONTIN) 300 MG capsule Take 1 capsule (300 mg total) by mouth 2 (two) times daily. 180 capsule 1   metFORMIN (GLUCOPHAGE) 500 MG tablet Take 1 tablet (500 mg total) by mouth 2 (two) times daily with a meal. 180 tablet 1   naproxen (NAPROSYN) 500 MG tablet Take 1 tablet (500 mg total) by mouth 2 (two) times daily. 14 tablet 0   potassium chloride (KLOR-CON M) 10 MEQ tablet Take 2 tablets (20 mEq total) by mouth daily. 90 tablet 0   TRUE METRIX BLOOD GLUCOSE TEST test strip Use as instructed. Check blood glucose level by fingerstick twice per day. 100 each 1   TRUEplus Lancets 28G MISC Use as instructed. Check blood glucose level by fingerstick twice per day. 100 each 2   valsartan-hydrochlorothiazide (DIOVAN-HCT) 160-25 MG tablet Take 1 tablet by mouth daily. 90 tablet 1   No current facility-administered medications for this visit.   Diagnoses and all orders for this visit:  Essential hypertension Treated for hypertension urgency recommended taking medications daily amlodipine 10 mg,  carvedilol 12.5 mg twice daily valsartan HCTZ 160/25 -     cloNIDine (CATAPRES) tablet 0.1 mg -     cloNIDine (CATAPRES) tablet 0.1 mg    Hypertensive Medication Review: Patient states that they are taking all their hypertensive medications as prescribed and their last dose of hypertensive medications was this morning   Documentation of any medication adherence discrepancies: none  Provider Recommendation:  Spoke to [name] and they stated: Patient needed to be seen today, Visit was changed and provider gave orders for clonidine (0.2 mcg)  Patient has been scheduled to follow up with Follow up on Hypertension as normal    Patient has been given provider's recommendations and does not have any questions or concerns at this time. Patient will contact the office for any future questions or concerns.  Blood pressure recheck nurse visit 2 weeks

## 2023-04-18 ENCOUNTER — Ambulatory Visit

## 2023-04-18 ENCOUNTER — Ambulatory Visit
Admission: RE | Admit: 2023-04-18 | Discharge: 2023-04-18 | Disposition: A | Discharge status: Home / Self Care | Source: Ambulatory Visit | Attending: Primary Care | Admitting: Primary Care

## 2023-04-18 DIAGNOSIS — R928 Other abnormal and inconclusive findings on diagnostic imaging of breast: Secondary | ICD-10-CM

## 2023-04-21 ENCOUNTER — Encounter (INDEPENDENT_AMBULATORY_CARE_PROVIDER_SITE_OTHER): Payer: Self-pay | Admitting: Primary Care

## 2023-04-25 ENCOUNTER — Other Ambulatory Visit: Payer: Self-pay

## 2023-04-25 NOTE — Patient Instructions (Signed)
 Visit Information  The Patient                                              was given information about Medicaid Managed Care team care coordination services and consented to engagement with the [facility] Managed Care team.   The  Patient                                              has been provided with contact information for the Managed Medicaid care management team and has been advised to call with any health related questions or concerns.   Abelino Derrick, MHA [facility]  Managed [facility] Social Worker [phone]

## 2023-04-25 NOTE — Patient Outreach (Signed)
  Medicaid Managed Care Social Work Note  04/25/2023 Name:  Stephanie [name] MRN:  [id number] DOB:  January 10, 1971  Stephanie [name] is an 53 y.o. year old female who is a primary patient of Grayce Sessions, NP.  The [facility] Managed Care Coordination team was consulted for assistance with:   Medicaid assistance  [name] was given information about Medicaid Managed Care Coordination team services today. Erin Fulling Pe[place] Patient agreed to services and verbal consent obtained.  Engaged with patient  for by telephone forinitial visit in response to referral for case management and/or care coordination services.   Patient is participating in a Managed Medicaid Plan:  Yes  Assessments/Interventions:  Review of past medical history, allergies, medications, health status, including review of consultants reports, laboratory and other test data, was performed as part of comprehensive evaluation and provision of chronic care management services.  SDOH: (Social Drivers of Health) assessments and interventions performed: SDOH Interventions    Flowsheet Row Office Visit from 02/20/2023 in [facility] Office Visit from 04/25/2022 in [facility] Office Visit from 11/16/2021 in [facility]  SDOH Interventions     Food Insecurity Interventions AMB Referral -- --  Transportation Interventions AMB Referral -- --  Utilities Interventions Intervention Not Indicated -- --  Depression Interventions/Treatment  Counseling  [prescribed never pick up SSRI] Counseling Counseling     BSW completed a telephone outreach with patient, she states she needs resources for seeing if her medicaid is still active. BSW informed there were no resources but she can contact or go down to the department of social services and they will be able to let her know if she needs to reapply or re certify. Patient states no other resources are needed  at this time. BSW provided contact information for any future needs   Advanced Directives Status:  Not addressed in this encounter.  Care Plan                 Allergies  Allergen Reactions   Lisinopril Cough    Medications Reviewed Today   Medications were not reviewed in this encounter     Patient Active Problem List   Diagnosis Date Noted   Acute CVA (cerebrovascular accident) (HCC) 07/11/2021   Polysubstance abuse (HCC) 07/11/2021   High cholesterol 11/21/2020   Mixed hyperlipidemia 01/05/2020   Elevated liver enzymes 01/05/2020   Asymptomatic hypertensive urgency 01/05/2020   Diabetes mellitus (HCC) 01/05/2020   Type 2 diabetes mellitus with diabetic neuropathy (HCC) 08/14/2019   Abnormal EKG 04/13/2018   Elevated blood sugar 12/31/2017   Essential hypertension 12/31/2017    Conditions to be addressed/monitored per PCP order:   Medicaid assistance  There are no care plans that you recently modified to display for this patient.   Follow up:  Patient agrees to Care Plan and Follow-up.  Plan: The  Patient has been provided with contact information for the Managed Medicaid care management team and has been advised to call with any health related questions or concerns.     Abelino Derrick, MHA [facility]  Managed [facility] Social Worker [phone]

## 2023-05-01 ENCOUNTER — Ambulatory Visit (INDEPENDENT_AMBULATORY_CARE_PROVIDER_SITE_OTHER)

## 2023-05-13 ENCOUNTER — Telehealth (INDEPENDENT_AMBULATORY_CARE_PROVIDER_SITE_OTHER): Payer: Self-pay | Admitting: Primary Care

## 2023-05-13 ENCOUNTER — Encounter

## 2023-05-13 ENCOUNTER — Other Ambulatory Visit

## 2023-05-13 NOTE — Telephone Encounter (Signed)
 Called to confirm appt. Pt will be present

## 2023-05-20 ENCOUNTER — Telehealth (INDEPENDENT_AMBULATORY_CARE_PROVIDER_SITE_OTHER): Payer: Self-pay | Admitting: Primary Care

## 2023-05-20 NOTE — Telephone Encounter (Signed)
 Left VM with pt about their upcoming appt.

## 2023-05-21 ENCOUNTER — Ambulatory Visit (INDEPENDENT_AMBULATORY_CARE_PROVIDER_SITE_OTHER): Payer: Self-pay | Admitting: Primary Care

## 2023-06-21 ENCOUNTER — Other Ambulatory Visit (INDEPENDENT_AMBULATORY_CARE_PROVIDER_SITE_OTHER): Payer: Self-pay | Admitting: Primary Care

## 2023-06-21 DIAGNOSIS — I10 Essential (primary) hypertension: Secondary | ICD-10-CM

## 2023-06-25 NOTE — Telephone Encounter (Signed)
 Requested Prescriptions  Refused Prescriptions Disp Refills   valsartan -hydrochlorothiazide  (DIOVAN -HCT) 160-25 MG tablet [Pharmacy Med Name: VALSARTAN -HCTZ 160-25 MG TAB] 30 tablet 5    Sig: TAKE 1 TABLET BY MOUTH EVERY DAY     Cardiovascular: ARB + Diuretic Combos Failed - 06/25/2023  8:39 AM      Failed - Cr in normal range and within 180 days    Creatinine, Ser  Date Value Ref Range Status  02/20/2023 1.07 (H) 0.57 - 1.00 mg/dL Final         Failed - Last BP in normal range    BP Readings from Last 1 Encounters:  04/17/23 (!) 210/115         Passed - K in normal range and within 180 days    Potassium  Date Value Ref Range Status  02/20/2023 4.0 3.5 - 5.2 mmol/L Final         Passed - Na in normal range and within 180 days    Sodium  Date Value Ref Range Status  02/20/2023 139 134 - 144 mmol/L Final         Passed - eGFR is 10 or above and within 180 days    GFR calc Af Amer  Date Value Ref Range Status  08/17/2019 82 >59 mL/min/1.73 Final    Comment:    **[facility] currently reports eGFR in compliance with the current**   recommendations of the [employer]. [facility] will   update reporting as new guidelines are published from the NKF-ASN   Task force.    GFR, Estimated  Date Value Ref Range Status  01/20/2022 >60 >60 mL/min Final    Comment:    (NOTE) Calculated using the CKD-EPI Creatinine Equation (2021)    eGFR  Date Value Ref Range Status  02/20/2023 62 >59 mL/min/1.73 Final         Passed - Patient is not pregnant      Passed - Valid encounter within last 6 months    Recent Outpatient Visits           2 months ago Essential hypertension   [place] [facility] Marius Siemens, NP   4 months ago Type 2 diabetes mellitus with other circulatory complication, without long-term current use of insulin  (HCC)   [place] [facility] Marius Siemens, NP   1 year ago Type 2 diabetes mellitus with  other circulatory complication, without long-term current use of insulin  (HCC)   [place] [facility] Marius Siemens, NP   1 year ago Depression with anxiety   [place] [facility] Marius Siemens, NP   1 year ago Type 2 diabetes mellitus with diabetic neuropathy, without long-term current use of insulin  (HCC)   [place] [facility] Marius Siemens, NP               amLODipine  (NORVASC ) 10 MG tablet [Pharmacy Med Name: AMLODIPINE  BESYLATE 10 MG TAB] 30 tablet 5    Sig: TAKE 1 TABLET BY MOUTH EVERY DAY     Cardiovascular: Calcium  Channel Blockers 2 Failed - 06/25/2023  8:39 AM      Failed - Last BP in normal range    BP Readings from Last 1 Encounters:  04/17/23 (!) 210/115         Passed - Last Heart Rate in normal range    Pulse Readings from Last 1 Encounters:  04/17/23 63  Passed - Valid encounter within last 6 months    Recent Outpatient Visits           2 months ago Essential hypertension   [place] [facility] Marius Siemens, NP   4 months ago Type 2 diabetes mellitus with other circulatory complication, without long-term current use of insulin  (HCC)   [place] [facility] Marius Siemens, NP   1 year ago Type 2 diabetes mellitus with other circulatory complication, without long-term current use of insulin  [facility])   [place] [facility] Marius Siemens, NP   1 year ago Depression with anxiety   [place] [facility] Marius Siemens, NP   1 year ago Type 2 diabetes mellitus with diabetic neuropathy, without long-term current use of insulin  [facility])   [place] [facility] Marius Siemens, NP

## 2023-07-04 ENCOUNTER — Telehealth (INDEPENDENT_AMBULATORY_CARE_PROVIDER_SITE_OTHER): Payer: Self-pay | Admitting: Primary Care

## 2023-07-04 NOTE — Telephone Encounter (Signed)
 Would I be able to send rx's to [facility]

## 2023-07-04 NOTE — Telephone Encounter (Signed)
 Copied from CRM [id number]. Topic: Clinical - Prescription Issue >> Jul 04, 2023 10:00 AM Stephanie [name] wrote: Reason for CRM: Patient states that she is unable to get medications due to her insurance and not being able to afford them. Patient is wanting to know if there are any alternative medications or medication assistance programs that can help her. Request a callback from provider to discuss her options.

## 2023-07-05 ENCOUNTER — Other Ambulatory Visit: Payer: Self-pay

## 2023-07-05 NOTE — Telephone Encounter (Signed)
 Reached out to pt to go over [employer]. Pt states she got her medications situated and she spoke with [facility] and she will be getting her medications through them

## 2023-07-17 ENCOUNTER — Telehealth (INDEPENDENT_AMBULATORY_CARE_PROVIDER_SITE_OTHER): Payer: Self-pay | Admitting: Primary Care

## 2023-07-17 NOTE — Telephone Encounter (Signed)
 Called pt to confirm appt. Pt will be present.

## 2023-07-18 ENCOUNTER — Ambulatory Visit (INDEPENDENT_AMBULATORY_CARE_PROVIDER_SITE_OTHER): Admitting: Primary Care

## 2023-07-29 ENCOUNTER — Telehealth (INDEPENDENT_AMBULATORY_CARE_PROVIDER_SITE_OTHER): Payer: Self-pay | Admitting: Primary Care

## 2023-07-29 NOTE — Telephone Encounter (Signed)
 Spoke to pt about upcoming appt.. Will be present

## 2023-07-30 ENCOUNTER — Encounter (INDEPENDENT_AMBULATORY_CARE_PROVIDER_SITE_OTHER): Admitting: Primary Care

## 2023-07-30 ENCOUNTER — Encounter (INDEPENDENT_AMBULATORY_CARE_PROVIDER_SITE_OTHER): Payer: Self-pay | Admitting: Primary Care

## 2023-07-30 NOTE — Progress Notes (Signed)
 LEFT AMA elevated Bp needed tx. Became upset because she was early for her appt and the patient was present and on time for appt . She could not understand why he was seen first

## 2023-10-14 ENCOUNTER — Other Ambulatory Visit: Payer: Self-pay

## 2023-10-14 ENCOUNTER — Ambulatory Visit: Payer: Self-pay | Admitting: Primary Care

## 2023-10-14 ENCOUNTER — Emergency Department (HOSPITAL_COMMUNITY)
Admission: EM | Admit: 2023-10-14 | Discharge: 2023-10-14 | Disposition: A | Discharge status: Home / Self Care | Attending: Emergency Medicine | Admitting: Emergency Medicine

## 2023-10-14 ENCOUNTER — Emergency Department (HOSPITAL_COMMUNITY)

## 2023-10-14 DIAGNOSIS — Z79899 Other long term (current) drug therapy: Secondary | ICD-10-CM | POA: Diagnosis not present

## 2023-10-14 DIAGNOSIS — R200 Anesthesia of skin: Secondary | ICD-10-CM | POA: Insufficient documentation

## 2023-10-14 DIAGNOSIS — R202 Paresthesia of skin: Secondary | ICD-10-CM | POA: Diagnosis not present

## 2023-10-14 DIAGNOSIS — E119 Type 2 diabetes mellitus without complications: Secondary | ICD-10-CM | POA: Insufficient documentation

## 2023-10-14 LAB — COMPREHENSIVE METABOLIC PANEL WITH GFR
ALT: 58 U/L — ABNORMAL HIGH (ref 0–44)
AST: 55 U/L — ABNORMAL HIGH (ref 15–41)
Albumin: 3.7 g/dL (ref 3.5–5.0)
Alkaline Phosphatase: 112 U/L (ref 38–126)
Anion gap: 7 (ref 5–15)
BUN: 16 mg/dL (ref 6–20)
CO2: 19 mmol/L — ABNORMAL LOW (ref 22–32)
Calcium: 8.6 mg/dL — ABNORMAL LOW (ref 8.9–10.3)
Chloride: 115 mmol/L — ABNORMAL HIGH (ref 98–111)
Creatinine, Ser: 1.11 mg/dL — ABNORMAL HIGH (ref 0.44–1.00)
GFR, Estimated: 59 mL/min — ABNORMAL LOW (ref >60)
Glucose, Bld: 184 mg/dL — ABNORMAL HIGH (ref 70–99)
Potassium: 4 mmol/L (ref 3.5–5.1)
Sodium: 141 mmol/L (ref 135–145)
Total Bilirubin: 0.7 mg/dL (ref 0.0–1.2)
Total Protein: 6.6 g/dL (ref 6.5–8.1)

## 2023-10-14 LAB — CBC
HCT: 36.2 % (ref 36.0–46.0)
Hemoglobin: 12.5 g/dL (ref 12.0–15.0)
MCH: 31 pg (ref 26.0–34.0)
MCHC: 34.5 g/dL (ref 30.0–36.0)
MCV: 89.8 fL (ref 80.0–100.0)
Platelets: 193 K/uL (ref 150–400)
RBC: 4.03 MIL/uL (ref 3.87–5.11)
RDW: 16.3 % — ABNORMAL HIGH (ref 11.5–15.5)
WBC: 6.9 K/uL (ref 4.0–10.5)
nRBC: 0 % (ref 0.0–0.2)

## 2023-10-14 NOTE — Discharge Instructions (Signed)
 Please follow-up at [facility] neurology.   Please return immediately to the emergency room for any slurred speech, confusion, new numbness or weakness or facial droop.

## 2023-10-14 NOTE — Telephone Encounter (Signed)
 FYI Only or Action Required?: FYI only for provider.  Patient was last seen in primary care on 07/30/2023 by Celestia Rosaline SQUIBB, NP.  Called Nurse Triage reporting Numbness.  Symptoms began several days ago.  Triage Disposition: Call EMS 911 Now  Patient/caregiver understands and will follow disposition?: Yes                  Copied from CRM #[id number]. Topic: Clinical - Red Word Triage >> Oct 14, 2023  9:28 AM Robinson H wrote: [facility] that prompted transfer to Nurse Triage: Numbness in feet and hand on right side Reason for Disposition  [1] Weakness (i.e., paralysis, loss of muscle strength) of the face, arm / hand, or leg / foot on one side of the body AND [2] sudden onset AND [3] present now  (Exception: Bell's palsy suspected: weakness on one side of the face developing over hours to days, with no other symptoms.)  Answer Assessment - Initial Assessment Questions This RN contacted EMS for pt and an ambulance is on way to pt's current location:  [facility] [place] [address], [place], [place] [address] Room 130    SYMPTOM: What is the main symptom you are concerned about? (e.g., weakness, numbness)     Numbness in right foot and right hand  ONSET: When did this start? (e.g., minutes, hours, days; while sleeping)     Two days ago  PATTERN Does this come and go, or has it been constant since it started?  Is it present now?     Constant; never happened before  CARDIAC SYMPTOMS: Have you had any of the following symptoms: chest pain, difficulty breathing, palpitations?     No  NEUROLOGIC SYMPTOMS: Have you had any of the following symptoms: headache, dizziness, vision loss, double vision, changes in speech, unsteady on your feet?     No  OTHER SYMPTOMS: Do you have any other symptoms?     No  Protocols used: Neurologic Deficit-A-AH

## 2023-10-14 NOTE — ED Provider Notes (Signed)
 [place] EMERGENCY DEPARTMENT AT [facility] Provider Note   CSN: [id number] Arrival date & time: 10/14/23  1011     Patient presents with: Numbness   Stephanie [name] is a 53 y.o. female.  {Add pertinent medical, surgical, social history, OB history to HPI:32947} HPI     Prior to Admission medications   Medication Sig Start Date End Date Taking? Authorizing Provider  albuterol  (VENTOLIN  HFA) 108 (90 Base) MCG/ACT inhaler INHALE 2 PUFFS INTO THE LUNGS EVERY 6 (SIX) HOURS AS NEEDED FOR WHEEZING OR SHORTNESS OF BREATH. 02/20/23   Celestia Rosaline SQUIBB, NP  amLODipine  (NORVASC ) 10 MG tablet Take 1 tablet (10 mg total) by mouth daily. 02/20/23 02/20/24  Celestia Rosaline SQUIBB, NP  atorvastatin  (LIPITOR) 40 MG tablet Take 1 tablet (40 mg total) by mouth daily. 02/20/23   Celestia Rosaline SQUIBB, NP  carvedilol  (COREG ) 12.5 MG tablet Take 1 tablet (12.5 mg total) by mouth 2 (two) times daily with a meal. 02/20/23 02/20/24  Celestia Rosaline SQUIBB, NP  gabapentin  (NEURONTIN ) 300 MG capsule Take 1 capsule (300 mg total) by mouth 2 (two) times daily. 02/20/23   Celestia Rosaline SQUIBB, NP  metFORMIN  (GLUCOPHAGE ) 500 MG tablet Take 1 tablet (500 mg total) by mouth 2 (two) times daily with a meal. 02/20/23   Celestia Rosaline SQUIBB, NP  naproxen  (NAPROSYN ) 500 MG tablet Take 1 tablet (500 mg total) by mouth 2 (two) times daily. 01/07/22   Loeffler, Ronnald BROCKS, PA-C  potassium chloride  (KLOR-CON  M) 10 MEQ tablet Take 2 tablets (20 mEq total) by mouth daily. 04/25/22   Celestia Rosaline SQUIBB, NP  TRUE METRIX BLOOD GLUCOSE TEST test strip Use as instructed. Check blood glucose level by fingerstick twice per day. 10/13/18   Fleming, Zelda W, NP  TRUEplus Lancets 28G MISC Use as instructed. Check blood glucose level by fingerstick twice per day. 10/13/18   Fleming, Zelda W, NP  valsartan -hydrochlorothiazide  (DIOVAN -HCT) 160-25 MG tablet Take 1 tablet by mouth daily. 02/20/23   Celestia Rosaline SQUIBB, NP    Allergies: Lisinopril      Review of Systems  Updated Vital Signs BP (!) 148/92 (BP Location: Right Arm)   Pulse 82   Temp 98.8 F (37.1 C) (Oral)   Resp 16   SpO2 100%   Physical Exam  (all labs ordered are listed, but only abnormal results are displayed) Labs Reviewed  CBC  COMPREHENSIVE METABOLIC PANEL WITH GFR    EKG: None  Radiology: No results found.  {Document cardiac monitor, telemetry assessment procedure when appropriate:32947} Procedures   Medications Ordered in the ED - No data to display  Clinical Course as of 10/14/23 1139  Mon Oct 14, 2023  1123 Patient is a 53 year old female with past medical history significant for stroke presented emergency room today with right upper extremity and right lower extremity numbness for several months.  Denies any vision changes. No slurred speech or confusion. No NV or abd pain.  [WF]    Clinical Course User Index [WF] Neldon Hamp RAMAN, PA   {Click here for ABCD2, HEART and other calculators REFRESH Note before signing:1}                              Medical Decision Making Amount and/or Complexity of Data Reviewed Labs: ordered. Radiology: ordered.   ***  {Document critical care time when appropriate  Document review of labs and clinical decision tools ie CHADS2VASC2, etc  Document  your independent review of radiology images and any outside records  Document your discussion with family members, caretakers and with consultants  Document social determinants of health affecting pt's care  Document your decision making why or why not admission, treatments were needed:32947:::1}   Final diagnoses:  None    ED Discharge Orders     None

## 2023-10-14 NOTE — ED Triage Notes (Signed)
 Patient via EMS from [facility] for eval of RUE and RLE numbness x 2 days. Called PCP for same this morning who called an ambulance to pick her up.

## 2023-10-15 IMAGING — MR MR CERVICAL SPINE WO/W CM
4 of 8 series · 19 of 48 positions shown · IV contrast (gadavist)
Comparison: None Available.

CLINICAL DATA: Multiple sclerosis (MS).  Concern for ms

EXAM:
MRI CERVICAL SPINE WITHOUT AND WITH CONTRAST
TECHNIQUE: Multiplanar and multiecho pulse sequences of the cervical spine, to
include the craniocervical junction and cervicothoracic junction,
were obtained without and with intravenous contrast.
CONTRAST:  5mL GADAVIST GADOBUTROL 1 MMOL/ML IV SOLN

[Series 3: T2 · sagittal · 3.0mm · 0.43mm/px · 3 of 16 slices shown (1 of 2)]
[im 1/16]
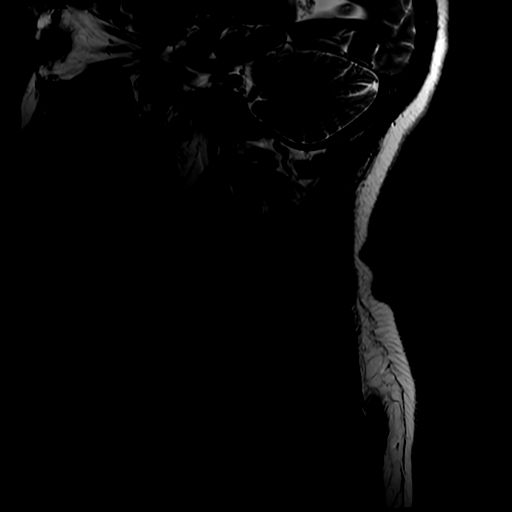
[im 8/16]
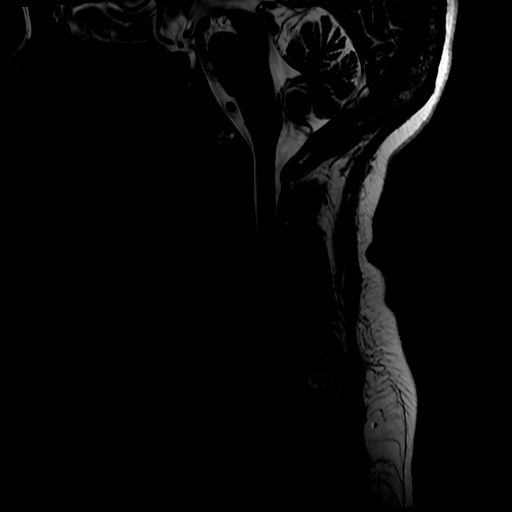
[im 16/16]
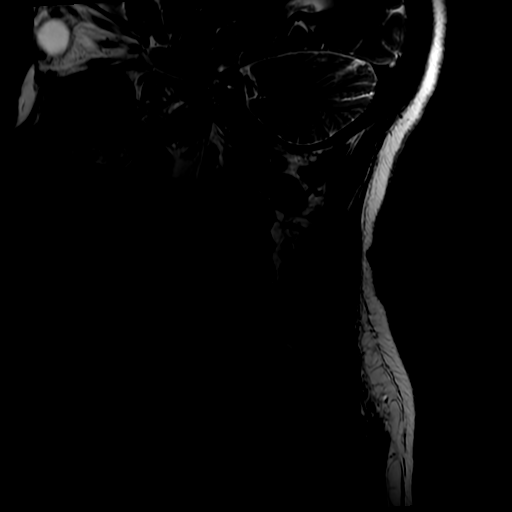

[Series 7: T2 · axial · 3.0mm · 0.35mm/px · z∈[-210,-133]mm · 6 of 26 slices shown (2 of 2)]
[im 1/26]
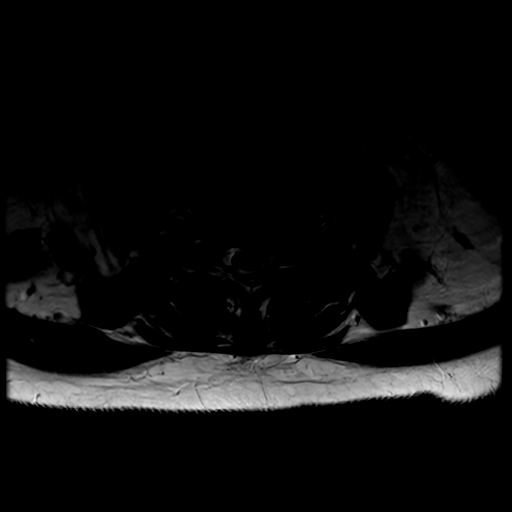
[im 6/26]
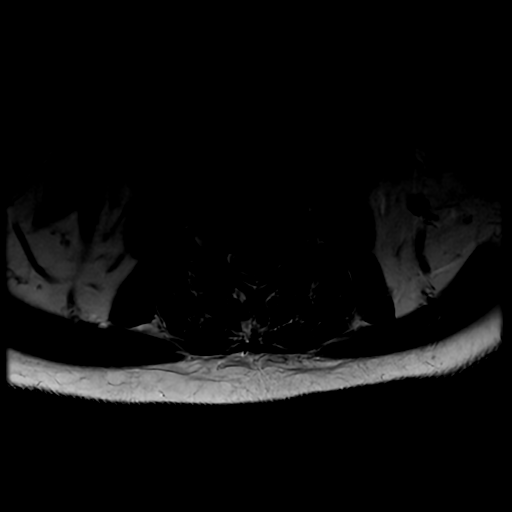
[im 11/26]
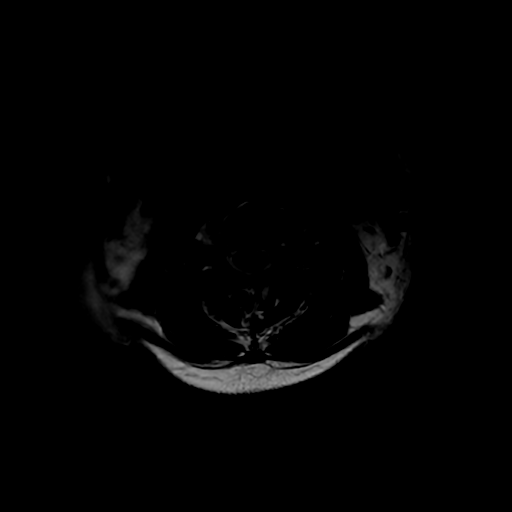
[im 16/26]
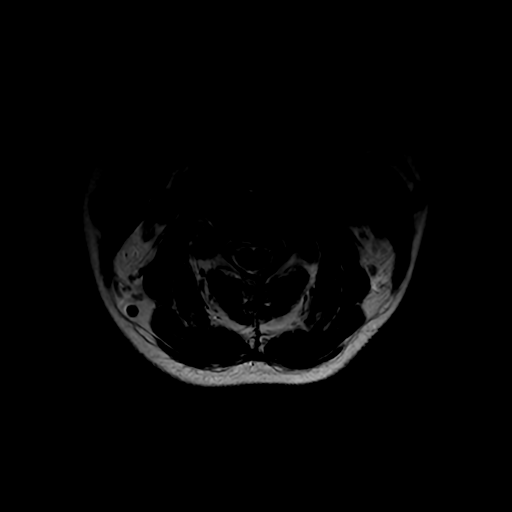
[im 21/26]
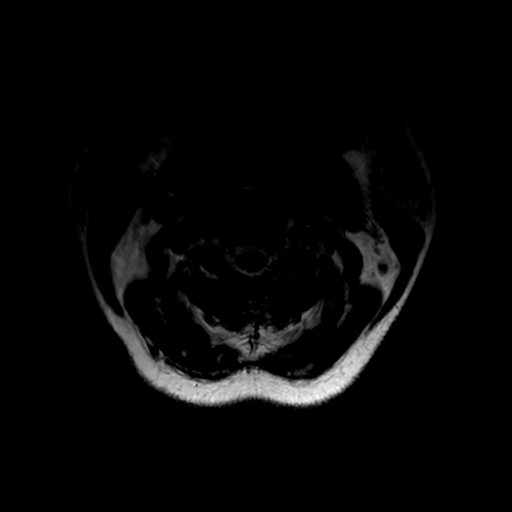
[im 26/26]
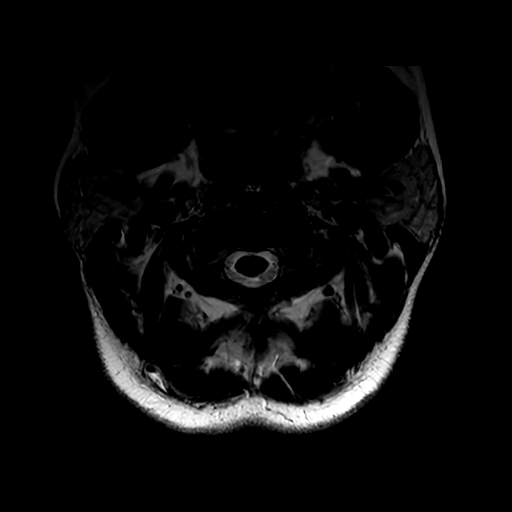

[Series 9: T1 · axial · non-contrast · 3.0mm · 0.35mm/px · z∈[-210,-133]mm · 6 of 26 slices shown]
[im 1/26]
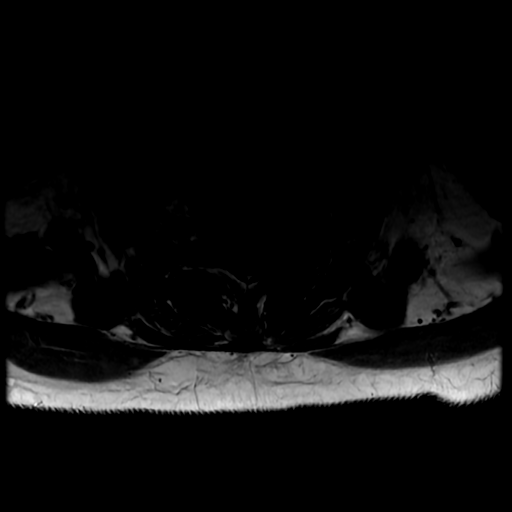
[im 6/26]
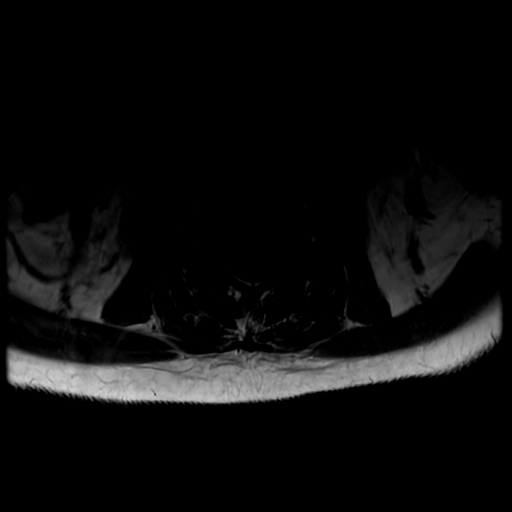
[im 11/26]
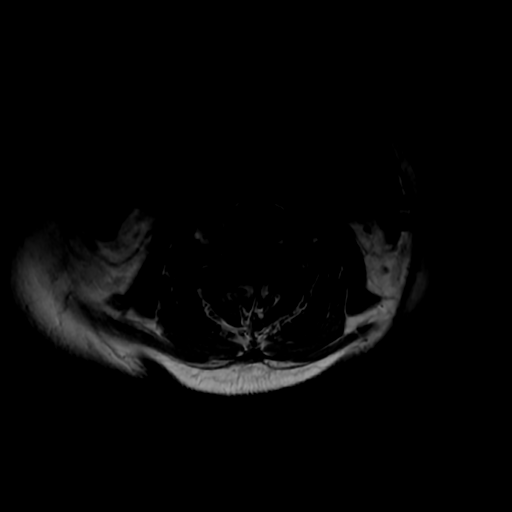
[im 16/26]
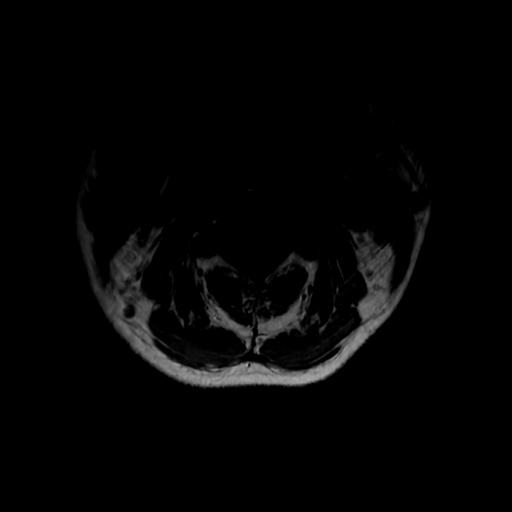
[im 21/26]
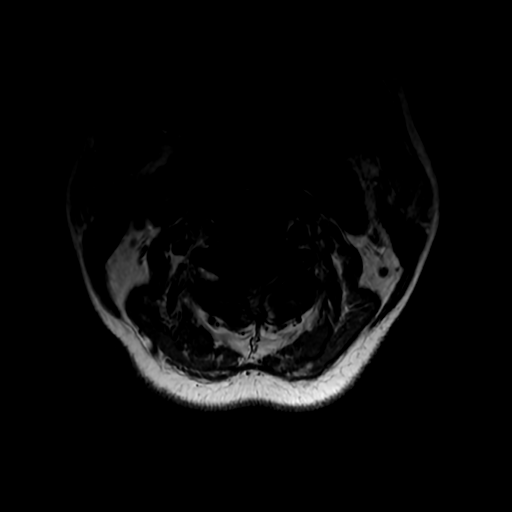
[im 26/26]
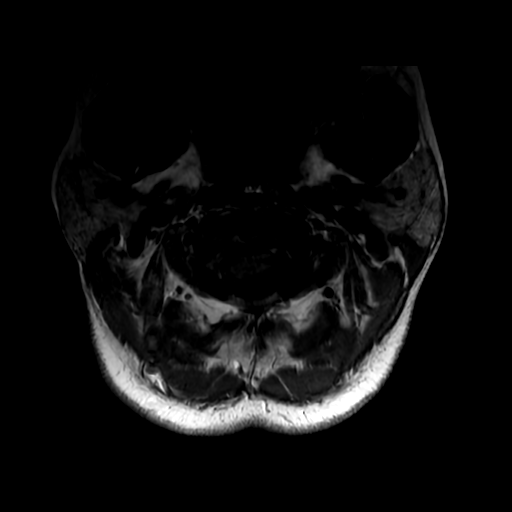

[Series 10: T1 fat-sat post-contrast · sagittal · 3.0mm · 0.43mm/px · 4 of 16 slices shown]
[im 1/16]
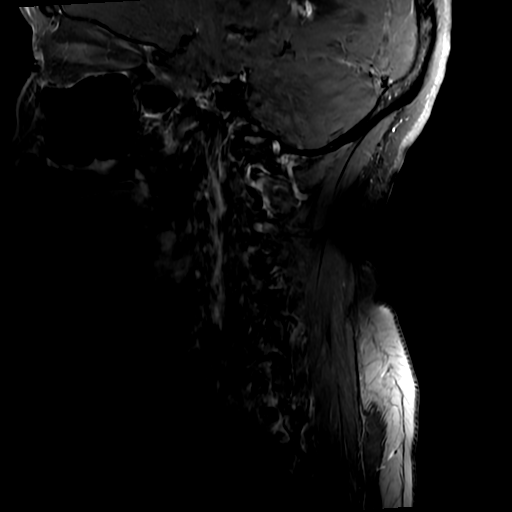
[im 6/16]
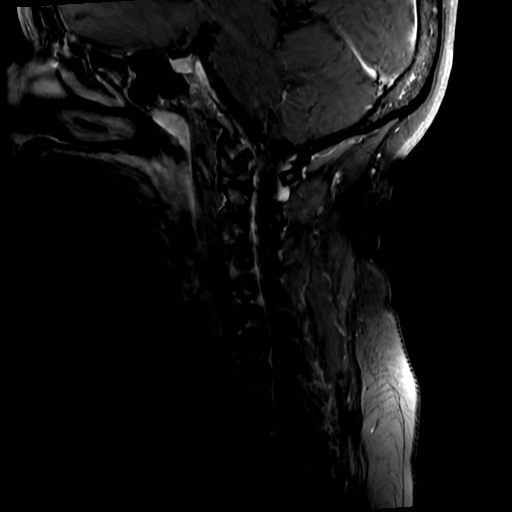
[im 11/16]
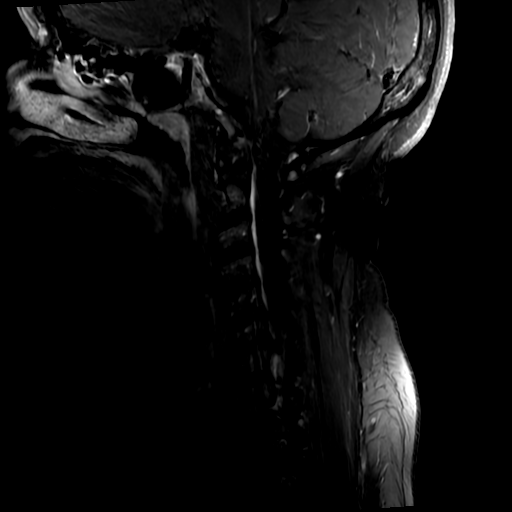
[im 16/16]
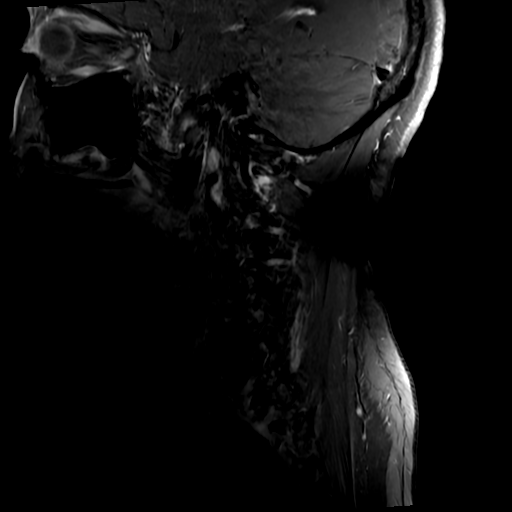

[19 of 48 positions shown; findings below may reference images not displayed]

FINDINGS: Alignment: Mild reversal the normal cervical lordosis. No
substantial sagittal subluxation.

Vertebrae: Vertebral body heights are maintained. No evidence of
acute fracture, discitis/osteomyelitis, or suspicious bone lesion.

Cord: No evidence of abnormal cord signal or cord enhancement on
motion limited assessment.

Posterior Fossa, vertebral arteries, paraspinal tissues: Visualized
vertebral artery flow voids are maintained. Posterior fossa better
assessed on MRI head from yesterday.

Disc levels:

C2-C3: No significant disc protrusion, foraminal stenosis, or canal
stenosis.

C3-C4: Mild uncovertebral hypertrophy without significant canal or
foraminal stenosis.

C4-C5: Left greater than right facet and uncovertebral hypertrophy
with mild-to-moderate left foraminal stenosis. No significant canal
stenosis.

C5-C6: Minimal uncovertebral hypertrophy without significant canal
or foraminal stenosis.

C6-C7: Mild right greater than left facet and uncovertebral
hypertrophy with mild right foraminal stenosis. No significant canal
or left foraminal stenosis.

C7-T1: No significant disc protrusion, foraminal stenosis, or canal
stenosis.
IMPRESSION: 1. No evidence of abnormal cord signal or cord enhancement on motion
limited assessment.
2. Mild multilevel degenerative change with mild-to-moderate left
foraminal stenosis C4-C5 and mild right foraminal stenosis C6-C7.

## 2023-10-15 IMAGING — CT CT ANGIO HEAD-NECK (W OR W/O PERF)
1 of 11 series · 14 of 47 positions shown · non-contrast
Comparison: Brain MRI and head CT 07/11/2021

CLINICAL DATA: Stroke follow-up

EXAM:
CT ANGIOGRAPHY HEAD AND NECK
TECHNIQUE: Multidetector CT imaging of the head and neck was performed using
the standard protocol during bolus administration of intravenous
contrast. Multiplanar CT image reconstructions and MIPs were
obtained to evaluate the vascular anatomy. Carotid stenosis
measurements (when applicable) are obtained utilizing NASCET
criteria, using the distal internal carotid diameter as the
denominator.

[Series 16: thin · axial · 0.46mm/px · z∈[+846,+1139]mm · 14 of 676 slices shown]
[im 46/676  brain]
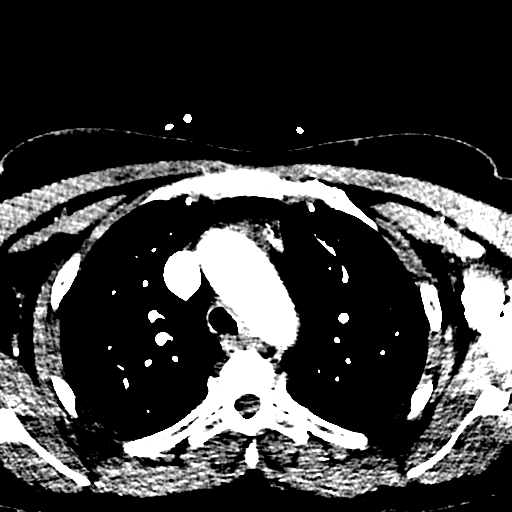
[im 91/676  bone]
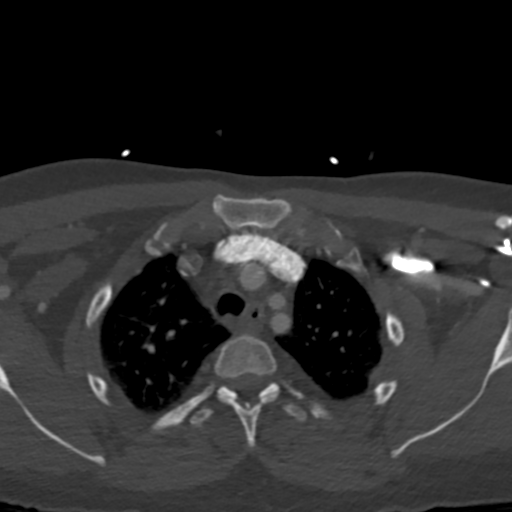
[im 136/676  brain]
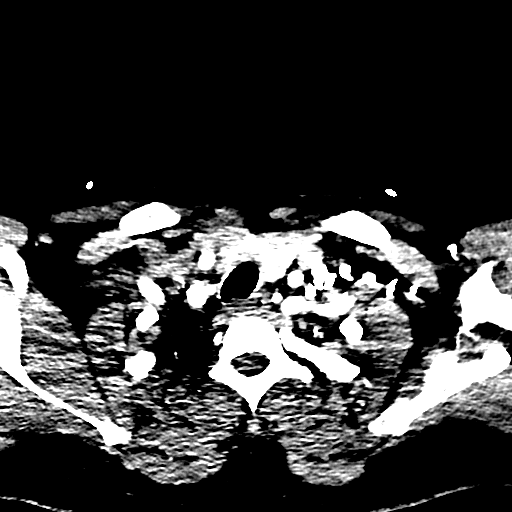
[im 181/676  bone]
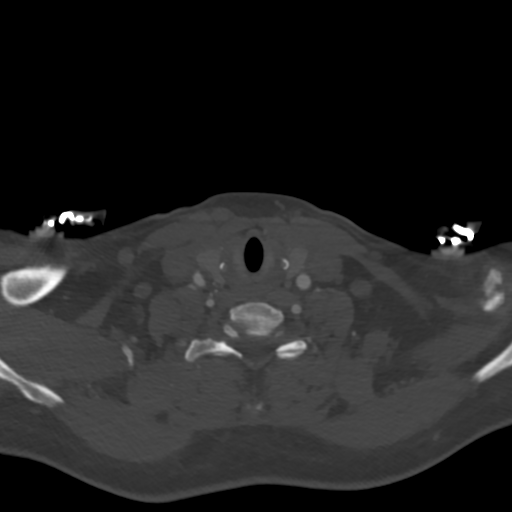
[im 226/676  brain]
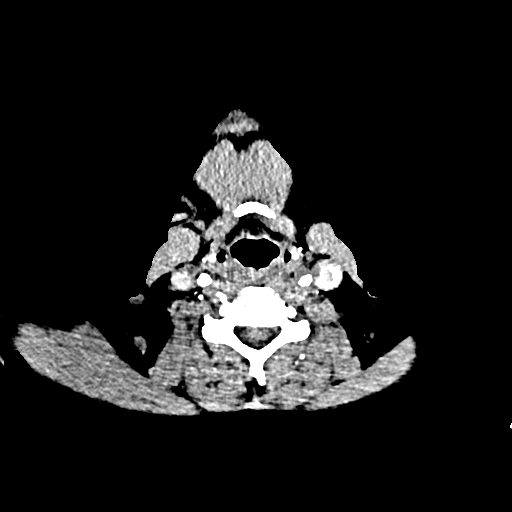
[im 271/676  bone]
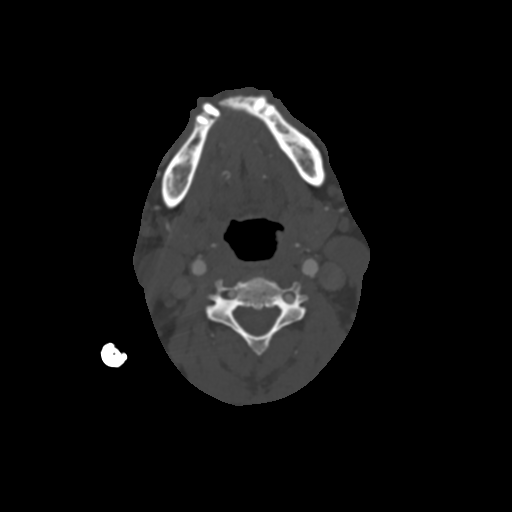
[im 316/676  brain]
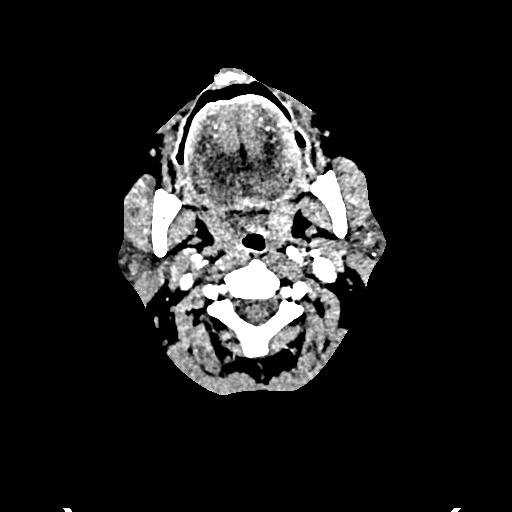
[im 361/676  bone]
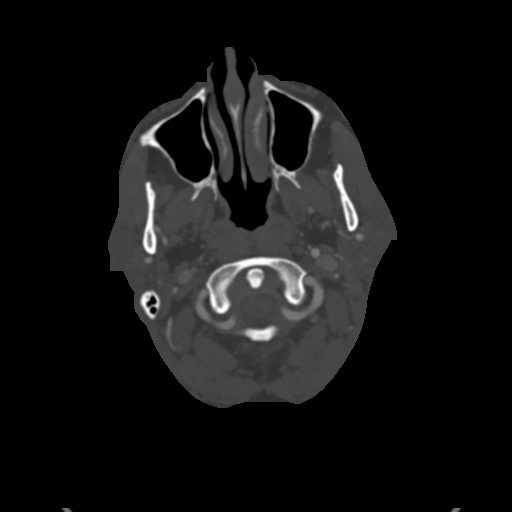
[im 406/676  brain]
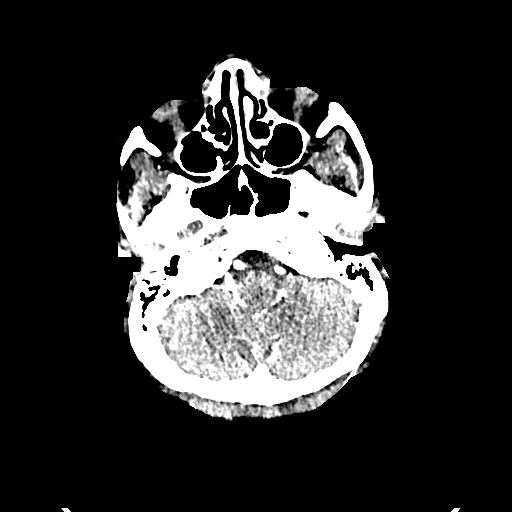
[im 451/676  bone]
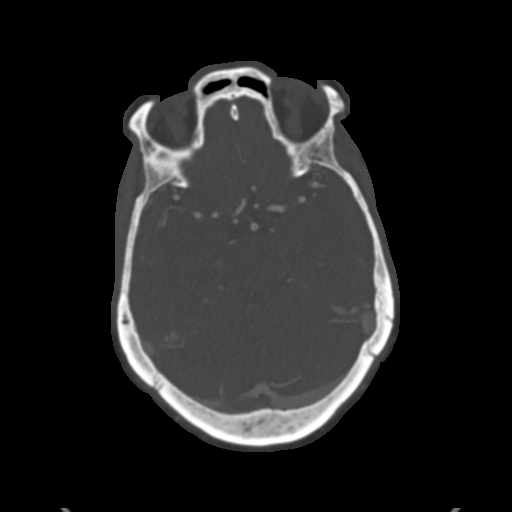
[im 496/676  brain]
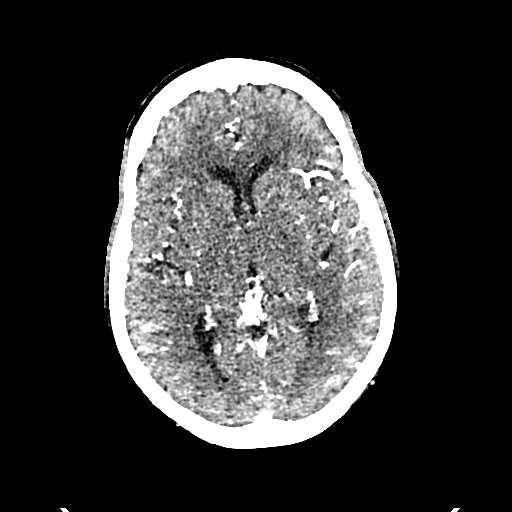
[im 541/676  bone]
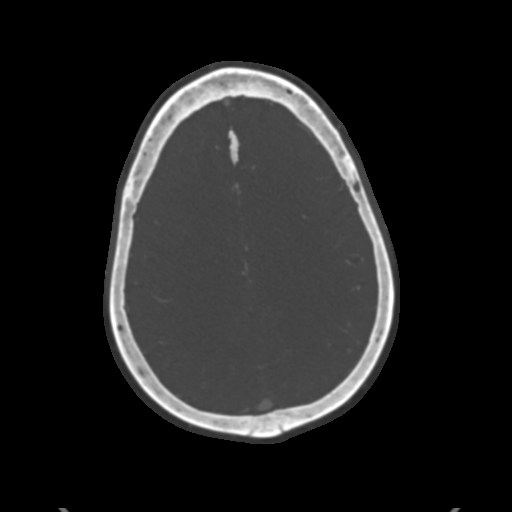
[im 586/676  brain]
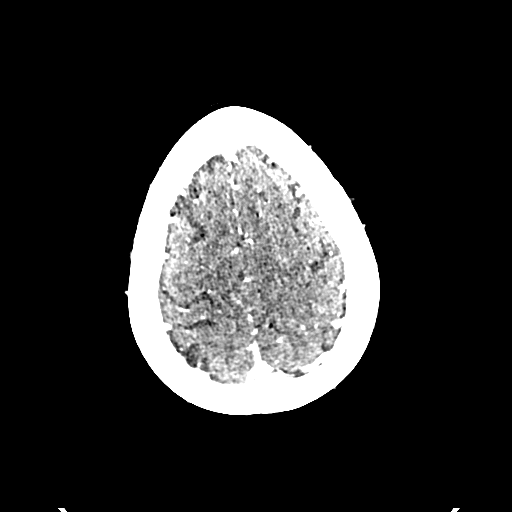
[im 631/676  bone]
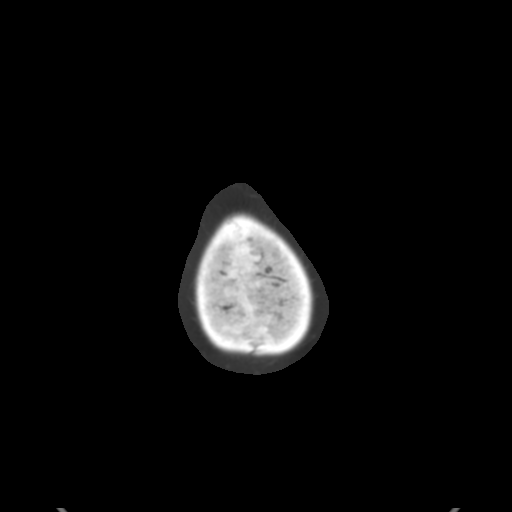

[14 of 47 positions shown; findings below may reference images not displayed]

RADIATION DOSE REDUCTION: This exam was performed according to the
departmental dose-optimization program which includes automated
exposure control, adjustment of the mA and/or kV according to
patient size and/or use of iterative reconstruction technique.

CONTRAST:  75mL OMNIPAQUE IOHEXOL 350 MG/ML SOLN
FINDINGS: CT HEAD FINDINGS

Brain: There is no mass, hemorrhage or extra-axial collection. The
size and configuration of the ventricles and extra-axial CSF spaces
are normal. There is no acute or chronic infarction. The brain
parenchyma is normal.

Skull: The visualized skull base, calvarium and extracranial soft
tissues are normal.

Sinuses/Orbits: No fluid levels or advanced mucosal thickening of
the visualized paranasal sinuses. No mastoid or middle ear effusion.
The orbits are normal.

CTA NECK FINDINGS

SKELETON: There is no bony spinal canal stenosis. No lytic or
blastic lesion.

OTHER NECK: Normal pharynx, larynx and major salivary glands. No
cervical lymphadenopathy. Unremarkable thyroid gland.

UPPER CHEST: No pneumothorax or pleural effusion. No nodules or
masses.

AORTIC ARCH:

There is no calcific atherosclerosis of the aortic arch. There is no
aneurysm, dissection or hemodynamically significant stenosis of the
visualized portion of the aorta. Conventional 3 vessel aortic
branching pattern. The visualized proximal subclavian arteries are
widely patent.

RIGHT CAROTID SYSTEM: The right internal carotid artery is occluded
at its origin with no reconstitution in the neck.

LEFT CAROTID SYSTEM: There is eccentric soft plaque within the
distal common carotid artery a causes 50% stenosis. The left ICA is
widely patent to the skull base.

VERTEBRAL ARTERIES: Left dominant configuration. Both origins are
clearly patent. There is no dissection, occlusion or flow-limiting
stenosis to the skull base (V1-V3 segments).

CTA HEAD FINDINGS

POSTERIOR CIRCULATION:

--Vertebral arteries: Normal V4 segments.

--Inferior cerebellar arteries: Normal.

--Basilar artery: Normal.

--Superior cerebellar arteries: Normal.

--Posterior cerebral arteries (PCA): Normal.

ANTERIOR CIRCULATION:

--Intracranial internal carotid arteries: Severe narrowing of the
right ICA at the skull base. Mild left ICA stenosis due to
atherosclerotic plaque calcification

--Anterior cerebral arteries (ACA): Normal. Both A1 segments are
present. Patent anterior communicating artery (a-comm).

--Middle cerebral arteries (MCA): Normal.

VENOUS SINUSES: As permitted by contrast timing, patent.

ANATOMIC VARIANTS: None

Review of the MIP images confirms the above findings.
IMPRESSION: 1. Occlusion of the right internal carotid artery at its origin with
no reconstitution in the neck.
2. Severely narrowed skull base and intracranial segments of the
right ICA.
3. 50% stenosis of the distal left common carotid artery.

## 2023-11-19 ENCOUNTER — Other Ambulatory Visit (INDEPENDENT_AMBULATORY_CARE_PROVIDER_SITE_OTHER): Payer: Self-pay | Admitting: Primary Care

## 2023-11-19 DIAGNOSIS — I10 Essential (primary) hypertension: Secondary | ICD-10-CM

## 2023-11-19 DIAGNOSIS — E782 Mixed hyperlipidemia: Secondary | ICD-10-CM

## 2023-11-21 NOTE — Telephone Encounter (Signed)
 Requested Prescriptions  Pending Prescriptions Disp Refills   valsartan -hydrochlorothiazide  (DIOVAN -HCT) 160-25 MG tablet [Pharmacy Med Name: VALSARTAN -HCTZ 160-25 MG TAB] 90 tablet 0    Sig: TAKE 1 TABLET BY MOUTH EVERY DAY     Cardiovascular: ARB + Diuretic Combos Failed - 11/21/2023  2:57 PM      Failed - Cr in normal range and within 180 days    Creatinine, Ser  Date Value Ref Range Status  10/14/2023 1.11 (H) 0.44 - 1.00 mg/dL Final         Failed - Last BP in normal range    BP Readings from Last 1 Encounters:  10/14/23 (!) 157/89         Passed - K in normal range and within 180 days    Potassium  Date Value Ref Range Status  10/14/2023 4.0 3.5 - 5.1 mmol/L Final         Passed - Na in normal range and within 180 days    Sodium  Date Value Ref Range Status  10/14/2023 141 135 - 145 mmol/L Final  02/20/2023 139 134 - 144 mmol/L Final         Passed - eGFR is 10 or above and within 180 days    GFR calc Af Amer  Date Value Ref Range Status  08/17/2019 82 >59 mL/min/1.73 Final    Comment:    **[facility] currently reports eGFR in compliance with the current**   recommendations of the [employer]. [facility] will   update reporting as new guidelines are published from the NKF-ASN   Task force.    GFR, Estimated  Date Value Ref Range Status  10/14/2023 59 (L) >60 mL/min Final    Comment:    (NOTE) Calculated using the CKD-EPI Creatinine Equation (2021)    eGFR  Date Value Ref Range Status  02/20/2023 62 >59 mL/min/1.73 Final         Passed - Patient is not pregnant      Passed - Valid encounter within last 6 months    Recent Outpatient Visits           7 months ago Essential hypertension   [place] [facility] Celestia Rosaline SQUIBB, NP   9 months ago Type 2 diabetes mellitus with other circulatory complication, without long-term current use of insulin  (HCC)   [place] [facility] Celestia Rosaline SQUIBB, NP   1  year ago Type 2 diabetes mellitus with other circulatory complication, without long-term current use of insulin  (HCC)   [place] [facility] Celestia Rosaline SQUIBB, NP   1 year ago Depression with anxiety   [place] [facility] Celestia Rosaline SQUIBB, NP   2 years ago Type 2 diabetes mellitus with diabetic neuropathy, without long-term current use of insulin  (HCC)    [facility] Celestia Rosaline SQUIBB, NP               atorvastatin  (LIPITOR) 40 MG tablet [Pharmacy Med Name: ATORVASTATIN  40 MG TABLET] 90 tablet 0    Sig: TAKE 1 TABLET BY MOUTH EVERY DAY     Cardiovascular:  Antilipid - Statins Failed - 11/21/2023  2:57 PM      Failed - Lipid Panel in normal range within the last 12 months    Cholesterol, Total  Date Value Ref Range Status  02/20/2023 173 100 - 199 mg/dL Final   LDL Chol Calc (NIH)  Date Value Ref Range Status  02/20/2023 93 0 -  99 mg/dL Final   HDL  Date Value Ref Range Status  02/20/2023 52 >39 mg/dL Final   Triglycerides  Date Value Ref Range Status  02/20/2023 160 (H) 0 - 149 mg/dL Final         Passed - Patient is not pregnant      Passed - Valid encounter within last 12 months    Recent Outpatient Visits           7 months ago Essential hypertension   [place] [facility] Celestia Rosaline SQUIBB, NP   9 months ago Type 2 diabetes mellitus with other circulatory complication, without long-term current use of insulin  (HCC)   [place] [facility] Celestia Rosaline SQUIBB, NP   1 year ago Type 2 diabetes mellitus with other circulatory complication, without long-term current use of insulin  [facility])   [place] [facility] Celestia Rosaline SQUIBB, NP   1 year ago Depression with anxiety   [place] [facility] Celestia Rosaline SQUIBB, NP   2 years ago Type 2 diabetes mellitus with diabetic neuropathy, without long-term current use of insulin   [facility])    [facility] Celestia Rosaline SQUIBB, NP

## 2023-12-09 ENCOUNTER — Telehealth (INDEPENDENT_AMBULATORY_CARE_PROVIDER_SITE_OTHER): Payer: Self-pay | Admitting: Primary Care

## 2023-12-09 NOTE — Telephone Encounter (Signed)
 Called pt to reschedule appt. Pts phone is unavailable. Please reschedule pt if they do call back. Please advise.

## 2023-12-11 ENCOUNTER — Encounter (INDEPENDENT_AMBULATORY_CARE_PROVIDER_SITE_OTHER): Admitting: Primary Care

## 2023-12-19 ENCOUNTER — Encounter (INDEPENDENT_AMBULATORY_CARE_PROVIDER_SITE_OTHER): Payer: Self-pay | Admitting: Primary Care

## 2023-12-19 ENCOUNTER — Ambulatory Visit (INDEPENDENT_AMBULATORY_CARE_PROVIDER_SITE_OTHER): Admitting: Primary Care

## 2023-12-19 VITALS — BP 122/77 | HR 85 | Resp 16 | Ht 59.0 in | Wt 121.6 lb

## 2023-12-19 DIAGNOSIS — E1159 Type 2 diabetes mellitus with other circulatory complications: Secondary | ICD-10-CM

## 2023-12-19 DIAGNOSIS — Z7984 Long term (current) use of oral hypoglycemic drugs: Secondary | ICD-10-CM

## 2023-12-19 DIAGNOSIS — E1140 Type 2 diabetes mellitus with diabetic neuropathy, unspecified: Secondary | ICD-10-CM

## 2023-12-19 DIAGNOSIS — E782 Mixed hyperlipidemia: Secondary | ICD-10-CM | POA: Diagnosis not present

## 2023-12-19 DIAGNOSIS — M545 Low back pain, unspecified: Secondary | ICD-10-CM | POA: Diagnosis not present

## 2023-12-19 DIAGNOSIS — I10 Essential (primary) hypertension: Secondary | ICD-10-CM

## 2023-12-19 DIAGNOSIS — Z760 Encounter for issue of repeat prescription: Secondary | ICD-10-CM

## 2023-12-19 MED ORDER — CARVEDILOL 12.5 MG PO TABS: 12.5000 mg | ORAL_TABLET | Freq: Two times a day (BID) | ORAL | 1 refills | Status: DC

## 2023-12-19 MED ORDER — GABAPENTIN 300 MG PO CAPS: 300.0000 mg | ORAL_CAPSULE | Freq: Two times a day (BID) | ORAL | 1 refills | Status: DC

## 2023-12-19 MED ORDER — VALSARTAN-HYDROCHLOROTHIAZIDE 160-25 MG PO TABS: 1.0000 | ORAL_TABLET | Freq: Every day | ORAL | 0 refills | Status: DC

## 2023-12-19 MED ORDER — METFORMIN HCL 500 MG PO TABS: 500.0000 mg | ORAL_TABLET | Freq: Two times a day (BID) | ORAL | 1 refills | Status: DC

## 2023-12-19 MED ORDER — MELOXICAM 7.5 MG PO TABS: 7.5000 mg | ORAL_TABLET | Freq: Every day | ORAL | 1 refills | Status: DC

## 2023-12-19 MED ORDER — AMLODIPINE BESYLATE 10 MG PO TABS: 10.0000 mg | ORAL_TABLET | Freq: Every day | ORAL | 1 refills | Status: DC

## 2023-12-19 NOTE — Progress Notes (Addendum)
 [facility]  Stephanie [name], is a 53 y.o. female  RDW:[id number]  [id number]  DOB - 11-13-1970        Subjective:   Stephanie [name] is a 53 y.o. female here today for an acute visit.  Back pain  Back Pain This is a chronic problem. The current episode started more than 1 month ago. The problem occurs constantly. The problem has been gradually worsening since onset. The pain is present in the lumbar spine. The quality of the pain is described as cramping and shooting. The pain does not radiate. The pain is at a severity of 10/10. The pain is severe. The pain is The same all the time. The symptoms are aggravated by bending, lying down, sitting, position, standing and twisting. Stiffness is present All day. (Homeless stays in a hotel propable mattress ) Risk factors include obesity. She has tried NSAIDs for the symptoms. The treatment provided no relief.  Patient requesting something for muscle spasms.  Medication refills.  Hypertension well-controlled-Patient has No headache, No chest pain, No abdominal pain - No Nausea, No new weakness tingling or numbness, No Cough - shortness of breath/s  No problems updated.  Comprehensive ROS Pertinent positive and negative noted in HPI   Allergies  Allergen Reactions   Lisinopril  Cough    Past Medical History:  Diagnosis Date   Diabetes mellitus without complication (HCC)    High cholesterol    Hypertension     Current Outpatient Medications on File Prior to Visit  Medication Sig Dispense Refill   albuterol  (VENTOLIN  HFA) 108 (90 Base) MCG/ACT inhaler INHALE 2 PUFFS INTO THE LUNGS EVERY 6 (SIX) HOURS AS NEEDED FOR WHEEZING OR SHORTNESS OF BREATH. 6.7 g 2   atorvastatin  (LIPITOR) 40 MG tablet TAKE 1 TABLET BY MOUTH EVERY DAY 90 tablet 0   potassium chloride  (KLOR-CON  M) 10 MEQ tablet Take 2 tablets (20 mEq total) by mouth daily. 90 tablet 0   TRUE METRIX BLOOD GLUCOSE TEST test strip Use as instructed. Check  blood glucose level by fingerstick twice per day. 100 each 1   TRUEplus Lancets 28G MISC Use as instructed. Check blood glucose level by fingerstick twice per day. 100 each 2   No current facility-administered medications on file prior to visit.   Health Maintenance  Topic Date Due   Eye exam for diabetics  Never done   Hepatitis B Vaccine (1 of 3 - 19+ 3-dose series) Never done   Pap with HPV screening  Never done   Complete foot exam   08/16/2020   Hemoglobin A1C  08/20/2023   COVID-19 Vaccine (1 - 2025-26 season) Never done   Zoster (Shingles) Vaccine (1 of 2) 03/20/2024*   Flu Shot  05/05/2024*   Pneumococcal Vaccine for age over 39 (2 of 2 - PCV) 12/18/2024*   Yearly kidney health urinalysis for diabetes  02/20/2024   Yearly kidney function blood test for diabetes  10/13/2024   Breast Cancer Screening  03/11/2025   Cologuard (Stool DNA test)  03/19/2026   DTaP/Tdap/Td vaccine (2 - Td or Tdap) 02/27/2028   Hepatitis C Screening  Completed   HIV Screening  Completed   HPV Vaccine  Aged Out   Meningitis B Vaccine  Aged Out  *Topic was postponed. The date shown is not the original due date.    Objective:  BP 122/77   Pulse 85   Resp 16   Ht 4' 11 (1.499 m)   Wt 121 lb 9.6 oz (55.2 kg)  SpO2 100%   BMI 24.56 kg/m   Vitals:   12/19/23 1023  BP: 122/77  Pulse: 85  Resp: 16  SpO2: 100%  Weight: 121 lb 9.6 oz (55.2 kg)  Height: 4' 11 (1.499 m)   BP Readings from Last 3 Encounters:  12/19/23 122/77  10/14/23 (!) 157/89  07/30/23 (!) 170/115      Physical Exam Vitals reviewed.  Constitutional:      Appearance: Normal appearance. She is obese.  HENT:     Head: Normocephalic.     Right Ear: Tympanic membrane, ear canal and external ear normal.     Left Ear: Tympanic membrane, ear canal and external ear normal.     Nose: Nose normal.     Mouth/Throat:     Mouth: Mucous membranes are moist.  Eyes:     Extraocular Movements: Extraocular movements intact.      Pupils: Pupils are equal, round, and reactive to light.  Cardiovascular:     Rate and Rhythm: Normal rate.  Pulmonary:     Effort: Pulmonary effort is normal.     Breath sounds: Normal breath sounds.  Abdominal:     General: Bowel sounds are normal.     Palpations: Abdomen is soft.  Musculoskeletal:        General: Normal range of motion.     Cervical back: Normal range of motion.  Skin:    General: Skin is warm and dry.  Neurological:     Mental Status: She is alert and oriented to person, place, and time.  Psychiatric:        Mood and Affect: Mood normal.        Behavior: Behavior normal.        Thought Content: Thought content normal.     Assessment & Plan  Unit likely right Quierra was seen today for hypertension and back pain.  Diagnoses and all orders for this visit:  Acute bilateral low back pain, unspecified whether sciatica present L4-S2 -     meloxicam (MOBIC) 7.5 MG tablet; Take 1 tablet (7.5 mg total) by mouth daily. -     AMB referral to orthopedics  Type 2 diabetes mellitus with diabetic neuropathy, without long-term current use of insulin  (HCC) -     Hemoglobin A1c -     Ambulatory referral to Ophthalmology -     gabapentin  (NEURONTIN ) 300 MG capsule; Take 1 capsule (300 mg total) by mouth 2 (two) times daily. -     metFORMIN  (GLUCOPHAGE ) 500 MG tablet; Take 1 tablet (500 mg total) by mouth 2 (two) times daily with a meal.  Mixed hyperlipidemia -     Lipid panel  Essential hypertension -     amLODipine  (NORVASC ) 10 MG tablet; Take 1 tablet (10 mg total) by mouth daily. -     carvedilol  (COREG ) 12.5 MG tablet; Take 1 tablet (12.5 mg total) by mouth 2 (two) times daily with a meal. -     valsartan -hydrochlorothiazide  (DIOVAN -HCT) 160-25 MG tablet; Take 1 tablet by mouth daily.  Medication refill -     amLODipine  (NORVASC ) 10 MG tablet; Take 1 tablet (10 mg total) by mouth daily. -     carvedilol  (COREG ) 12.5 MG tablet; Take 1 tablet (12.5 mg total) by  mouth 2 (two) times daily with a meal. -     gabapentin  (NEURONTIN ) 300 MG capsule; Take 1 capsule (300 mg total) by mouth 2 (two) times daily. -     metFORMIN  (GLUCOPHAGE ) 500 MG tablet;  Take 1 tablet (500 mg total) by mouth 2 (two) times daily with a meal. -     valsartan -hydrochlorothiazide  (DIOVAN -HCT) 160-25 MG tablet; Take 1 tablet by mouth daily.     Patient have been counseled extensively about nutrition and exercise. Other issues discussed during this visit include: low cholesterol diet, weight control and daily exercise, foot care, annual eye examinations at Ophthalmology, importance of adherence with medications and regular follow-up. We also discussed long term complications of uncontrolled diabetes and hypertension.   Return in about 3 months (around 03/20/2024) for fasting labs.  The patient was given clear instructions to go to ER or return to medical center if symptoms don't improve, worsen or new problems develop. The patient verbalized understanding. The patient was told to call to get lab results if they haven't heard anything in the next week.   This note has been created with Education officer, environmental. Any transcriptional errors are unintentional.   Rosaline SHAUNNA Bohr, NP 12/19/2023, 11:01 AM

## 2023-12-20 LAB — LIPID PANEL
Chol/HDL Ratio: 3.2 ratio (ref 0.0–4.4)
Cholesterol, Total: 111 mg/dL (ref 100–199)
HDL: 35 mg/dL — ABNORMAL LOW (ref >39)
LDL Chol Calc (NIH): 50 mg/dL (ref 0–99)
Triglycerides: 151 mg/dL — ABNORMAL HIGH (ref 0–149)
VLDL Cholesterol Cal: 26 mg/dL (ref 5–40)

## 2023-12-20 LAB — HEMOGLOBIN A1C
Est. average glucose Bld gHb Est-mCnc: 140 mg/dL
Hgb A1c MFr Bld: 6.5 % — ABNORMAL HIGH (ref 4.8–5.6)

## 2023-12-26 ENCOUNTER — Ambulatory Visit (INDEPENDENT_AMBULATORY_CARE_PROVIDER_SITE_OTHER): Payer: Self-pay | Admitting: Primary Care

## 2023-12-26 DIAGNOSIS — E782 Mixed hyperlipidemia: Secondary | ICD-10-CM

## 2023-12-26 MED ORDER — ATORVASTATIN CALCIUM 40 MG PO TABS: 40.0000 mg | ORAL_TABLET | Freq: Every day | ORAL | 1 refills | Status: DC

## 2023-12-26 MED ORDER — ATORVASTATIN CALCIUM 40 MG PO TABS: 40.0000 mg | ORAL_TABLET | Freq: Every day | ORAL | 0 refills | Status: DC

## 2024-01-07 ENCOUNTER — Encounter (INDEPENDENT_AMBULATORY_CARE_PROVIDER_SITE_OTHER): Payer: Self-pay

## 2024-01-21 ENCOUNTER — Ambulatory Visit: Admitting: Physical Medicine and Rehabilitation

## 2024-01-29 ENCOUNTER — Ambulatory Visit: Admitting: Physical Medicine and Rehabilitation

## 2024-02-17 ENCOUNTER — Other Ambulatory Visit: Payer: Self-pay | Admitting: Primary Care

## 2024-02-17 DIAGNOSIS — Z1231 Encounter for screening mammogram for malignant neoplasm of breast: Secondary | ICD-10-CM

## 2024-02-19 ENCOUNTER — Other Ambulatory Visit: Payer: Self-pay

## 2024-02-19 ENCOUNTER — Encounter: Payer: Self-pay | Admitting: Physical Medicine and Rehabilitation

## 2024-02-19 ENCOUNTER — Ambulatory Visit: Admitting: Physical Medicine and Rehabilitation

## 2024-02-19 VITALS — BP 204/132 | HR 92

## 2024-02-19 DIAGNOSIS — G8929 Other chronic pain: Secondary | ICD-10-CM | POA: Diagnosis not present

## 2024-02-19 DIAGNOSIS — M7918 Myalgia, other site: Secondary | ICD-10-CM | POA: Diagnosis not present

## 2024-02-19 DIAGNOSIS — M47819 Spondylosis without myelopathy or radiculopathy, site unspecified: Secondary | ICD-10-CM

## 2024-02-19 DIAGNOSIS — M545 Low back pain, unspecified: Secondary | ICD-10-CM

## 2024-02-19 MED ORDER — MELOXICAM 15 MG PO TABS: 15.0000 mg | ORAL_TABLET | Freq: Every day | ORAL | 0 refills | Status: AC

## 2024-02-19 MED ORDER — TIZANIDINE HCL 4 MG PO TABS: 4.0000 mg | ORAL_TABLET | Freq: Every day | ORAL | 0 refills | Status: AC

## 2024-02-19 NOTE — Progress Notes (Signed)
 "  Stephanie [name] - 54 y.o. female MRN [id number]  Date of birth: [birth year]  Office Visit Note: Visit Date: 02/19/2024 PCP: [name] Stephanie SQUIBB, NP Referred by: [name] Stephanie SQUIBB, NP  Subjective: Chief Complaint  Patient presents with   Lower Back - Pain   HPI: Stephanie [name] is a 54 y.o. female who comes in today per the request of Stephanie Celestia, NP for evaluation of chronic, worsening and severe bilateral lower back pain. Pain ongoing for several years. Her pain worsens with prolonged sitting and standing. She describes pain as sore and throbbing, currently rates as 7 out of 10. Some relief of pain with home exercise regimen, rest and use of medications. No relief of pain with meloxicam . No history of formal physical therapy. No prior imaging of lumbar spine. No history of lumbar surgery/injections. Patient denies focal weakness, numbness and tingling. No recent trauma or falls.      Review of Systems  Musculoskeletal:  Positive for back pain and myalgias.  Neurological:  Negative for tingling, sensory change, focal weakness and weakness.  All other systems reviewed and are negative.  Otherwise per HPI.  Assessment & Plan: Visit Diagnoses:    ICD-10-CM   1. Chronic bilateral low back pain without sciatica  M54.50 XR Lumbar Spine 2-3 Views   G89.29 Ambulatory referral to Physical Therapy    2. Spondylosis without myelopathy or radiculopathy  M47.819 Ambulatory referral to Physical Therapy    3. Myofascial pain syndrome  M79.18 Ambulatory referral to Physical Therapy       Plan: Findings:  Chronic, worsening and severe bilateral lower back pain. No radicular symptoms down the legs. Patient continues to have severe pain despite good conservative therapies such as home exercise regimen, rest and use of medications. I obtained lumbar radiographs in the office today that show normal anatomical alignment, no spondylolisthesis, well preserved disc spacing, no fractures.  Patients clinical presentation and exam are complex, differentials include facet mediated pain vs myofascial pain syndrome. She does have fairly significant myofascial pain to both thoracic and lumbar paraspinal regions upon palpation today. We discussed treatment plan in detail today. Next step is to place order for short course of formal physical therapy with a focus on manual treatments and possible dry needling. I also discussed medication management with her and called in short course of tizanidine  and meloxicam  for her to try. I would like to see her back in approximately 8 weeks post physical therapy for evaluation.     Meds & Orders:  Meds ordered this encounter  Medications   meloxicam  (MOBIC ) 15 MG tablet    Sig: Take 1 tablet (15 mg total) by mouth daily.    Dispense:  30 tablet    Refill:  0   tiZANidine  (ZANAFLEX ) 4 MG tablet    Sig: Take 1 tablet (4 mg total) by mouth at bedtime.    Dispense:  30 tablet    Refill:  0    Orders Placed This Encounter  Procedures   XR Lumbar Spine 2-3 Views   Ambulatory referral to Physical Therapy    Follow-up: Return for 8 week follow up post PT.   Procedures: No procedures performed      Clinical History: No specialty comments available.   She reports that she has been smoking cigarettes. She has never used smokeless tobacco.  Recent Labs    02/20/23 1154 12/19/23 0958  HGBA1C 6.4* 6.5*    Objective:  VS:  HT:  WT:   BMI:     BP:(!) 204/132  HR:92bpm  TEMP: ( )  RESP:  Physical Exam Vitals and nursing note reviewed.  HENT:     Head: Normocephalic and atraumatic.     Right Ear: External ear normal.     Left Ear: External ear normal.     Nose: Nose normal.     Mouth/Throat:     Mouth: Mucous membranes are moist.  Eyes:     Extraocular Movements: Extraocular movements intact.  Cardiovascular:     Rate and Rhythm: Normal rate.     Pulses: Normal pulses.  Pulmonary:     Effort: Pulmonary effort is normal.   Abdominal:     General: Abdomen is flat. There is no distension.  Musculoskeletal:        General: Tenderness present.     Cervical back: Normal range of motion.     Comments: Patient rises from seated position to standing without difficulty. Good lumbar range of motion. No pain noted with facet loading. 5/5 strength noted with bilateral hip flexion, knee flexion/extension, ankle dorsiflexion/plantarflexion and EHL. No clonus noted bilaterally. No pain upon palpation of greater trochanters. No pain with internal/external rotation of bilateral hips. Sensation intact bilaterally. Myofascial tenderness noted upon palpation of bilateral thoracic and lumbar paraspinal regions. Negative slump test bilaterally. Ambulates without aid, gait steady.     Skin:    General: Skin is warm and dry.     Capillary Refill: Capillary refill takes less than 2 seconds.  Neurological:     General: No focal deficit present.     Mental Status: She is alert and oriented to person, place, and time.  Psychiatric:        Mood and Affect: Mood normal.        Behavior: Behavior normal.     Ortho Exam  Imaging: No results found.  Past Medical/Family/Surgical/Social History: Medications & Allergies reviewed per EMR, new medications updated. Patient Active Problem List   Diagnosis Date Noted   Acute CVA (cerebrovascular accident) (HCC) 07/11/2021   Polysubstance abuse (HCC) 07/11/2021   High cholesterol 11/21/2020   Mixed hyperlipidemia 01/05/2020   Elevated liver enzymes 01/05/2020   Asymptomatic hypertensive urgency 01/05/2020   Diabetes mellitus (HCC) 01/05/2020   Type 2 diabetes mellitus with diabetic neuropathy (HCC) 08/14/2019   Abnormal EKG 04/13/2018   Elevated blood sugar 12/31/2017   Essential hypertension 12/31/2017   Past Medical History:  Diagnosis Date   Diabetes mellitus without complication (HCC)    High cholesterol    Hypertension    Family History  Problem Relation Age of Onset    Hypertension Mother    Diabetes Mother    History reviewed. No pertinent surgical history. Social History   Occupational History   Not on file  Tobacco Use   Smoking status: Every Day    Current packs/day: 0.25    Types: Cigarettes   Smokeless tobacco: Never  Vaping Use   Vaping status: Never Used  Substance and Sexual Activity   Alcohol use: Yes    Alcohol/week: 2.0 standard drinks of alcohol    Types: 2 Cans of beer per week    Comment: occasionally   Drug use: Yes    Types: Marijuana   Sexual activity: Not Currently    "

## 2024-02-19 NOTE — Progress Notes (Signed)
 Pain Scale   Average Pain 8-10        Patient has had low back pan intermittently x years. NKI. Pain has been worse x 1 month now. Occasionally radiates down thighs to just above the knee. Pain is worse with standing and sitting if she does it too long (15-20 minutes). Occasionally lying down will help the pain, but not always.

## 2024-02-19 NOTE — Progress Notes (Signed)
 Core Outcome Measures Index (COMI) Back Score  Average Pain 8  COMI Score 80 %

## 2024-02-20 ENCOUNTER — Other Ambulatory Visit (INDEPENDENT_AMBULATORY_CARE_PROVIDER_SITE_OTHER): Payer: Self-pay

## 2024-02-20 ENCOUNTER — Other Ambulatory Visit (INDEPENDENT_AMBULATORY_CARE_PROVIDER_SITE_OTHER): Payer: Self-pay | Admitting: Primary Care

## 2024-02-20 DIAGNOSIS — I10 Essential (primary) hypertension: Secondary | ICD-10-CM

## 2024-02-20 DIAGNOSIS — Z760 Encounter for issue of repeat prescription: Secondary | ICD-10-CM

## 2024-02-21 ENCOUNTER — Other Ambulatory Visit (INDEPENDENT_AMBULATORY_CARE_PROVIDER_SITE_OTHER): Payer: Self-pay | Admitting: Primary Care

## 2024-02-21 NOTE — Telephone Encounter (Signed)
 Requested Prescriptions  Refused Prescriptions Disp Refills   meloxicam  (MOBIC ) 7.5 MG tablet [Pharmacy Med Name: MELOXICAM  7.5 MG TABS 7.5 Tablet] 30 tablet 10    Sig: TAKE 1 TABLET BY MOUTH EVERY DAY     Analgesics:  COX2 Inhibitors Failed - 02/21/2024  1:21 PM      Failed - Manual Review: Labs are only required if the patient has taken medication for more than 8 weeks.      Failed - Cr in normal range and within 360 days    Creatinine, Ser  Date Value Ref Range Status  10/14/2023 1.11 (H) 0.44 - 1.00 mg/dL Final         Failed - AST in normal range and within 360 days    AST  Date Value Ref Range Status  10/14/2023 55 (H) 15 - 41 U/L Final         Failed - ALT in normal range and within 360 days    ALT  Date Value Ref Range Status  10/14/2023 58 (H) 0 - 44 U/L Final         Passed - HGB in normal range and within 360 days    Hemoglobin  Date Value Ref Range Status  10/14/2023 12.5 12.0 - 15.0 g/dL Final  98/84/7974 87.7 11.1 - 15.9 g/dL Final         Passed - HCT in normal range and within 360 days    HCT  Date Value Ref Range Status  10/14/2023 36.2 36.0 - 46.0 % Final   Hematocrit  Date Value Ref Range Status  02/20/2023 35.6 34.0 - 46.6 % Final         Passed - eGFR is 30 or above and within 360 days    GFR calc Af Amer  Date Value Ref Range Status  08/17/2019 82 >59 mL/min/1.73 Final    Comment:    **[facility] currently reports eGFR in compliance with the current**   recommendations of the [employer]. [facility] will   update reporting as new guidelines are published from the NKF-ASN   Task force.    GFR, Estimated  Date Value Ref Range Status  10/14/2023 59 (L) >60 mL/min Final    Comment:    (NOTE) Calculated using the CKD-EPI Creatinine Equation (2021)    eGFR  Date Value Ref Range Status  02/20/2023 62 >59 mL/min/1.73 Final         Passed - Patient is not pregnant      Passed - Valid encounter within last 12 months    Recent  Outpatient Visits           2 months ago Acute bilateral low back pain, unspecified whether sciatica present   [place] [facility] Celestia Rosaline SQUIBB, NP   10 months ago Essential hypertension   [place] [facility] Celestia Rosaline SQUIBB, NP   1 year ago Type 2 diabetes mellitus with other circulatory complication, without long-term current use of insulin  (HCC)   [place] [facility] Celestia Rosaline SQUIBB, NP   1 year ago Type 2 diabetes mellitus with other circulatory complication, without long-term current use of insulin  [facility])   [place] [facility] Celestia Rosaline SQUIBB, NP   2 years ago Depression with anxiety   [place] [facility] Celestia Rosaline SQUIBB, NP       Future Appointments             In 1 month [facility]

## 2024-02-25 ENCOUNTER — Telehealth: Payer: Self-pay

## 2024-02-25 NOTE — Telephone Encounter (Signed)
 Copied from CRM #[id number]. Topic: Clinical - Medication Refill >> Feb 24, 2024  5:16 PM Delon HERO wrote: Medication: valsartan -hydrochlorothiazide  (DIOVAN -HCT) 160-25 MG tablet [[id number]]  Has the patient contacted their pharmacy? Yes (Agent: If no, request that the patient contact the pharmacy for the refill. If patient does not wish to contact the pharmacy document the reason why and proceed with request.) (Agent: If yes, when and what did the pharmacy advise?)  This is the patient's preferred pharmacy:  [id number] [place], [place] - [address] [place] [address] Phone: [phone] Fax: [phone]  [facility] [address], [place] [phone] [place] [address] Phone: [phone] Fax: [phone]  [facility] [place]  - [place], [place] - [address] [place] [address] Phone: [phone] Fax: [phone]  Is this the correct pharmacy for this prescription? Yes If no, delete pharmacy and type the correct one.   Has the prescription been filled recently? Yes  Is the patient out of the medication? Yes  Has the patient been seen for an appointment in the last year OR does the patient have an upcoming appointment? Yes  Can we respond through MyChart? Yes  Agent: Please be advised that Rx refills may take up to 3 business days. We ask that you follow-up with your pharmacy.

## 2024-02-25 NOTE — Therapy (Signed)
 " OUTPATIENT PHYSICAL THERAPY THORACOLUMBAR EVALUATION   Patient Name: Stephanie [name] MRN: [id number] DOB:16-Nov-1970, 54 y.o., female Today's Date: 02/25/2024  END OF SESSION:   Past Medical History:  Diagnosis Date   Diabetes mellitus without complication (HCC)    High cholesterol    Hypertension    No past surgical history on file. Patient Active Problem List   Diagnosis Date Noted   Acute CVA (cerebrovascular accident) (HCC) 07/11/2021   Polysubstance abuse (HCC) 07/11/2021   High cholesterol 11/21/2020   Mixed hyperlipidemia 01/05/2020   Elevated liver enzymes 01/05/2020   Asymptomatic hypertensive urgency 01/05/2020   Diabetes mellitus (HCC) 01/05/2020   Type 2 diabetes mellitus with diabetic neuropathy (HCC) 08/14/2019   Abnormal EKG 04/13/2018   Elevated blood sugar 12/31/2017   Essential hypertension 12/31/2017    PCP: Celestia Rosaline SQUIBB, NP  REFERRING PROVIDER: Trudy Duwaine BRAVO, NP   REFERRING DIAG:  M54.50,G89.29 (ICD-10-CM) - Chronic bilateral low back pain without sciatica M47.819 (ICD-10-CM) - Spondylosis without myelopathy or radiculopathy M79.18 (ICD-10-CM) - Myofascial pain syndrome Eval and Treat: Chronic lower back pain, myofascial pain syndrome. Consider manual treatments, core strengthening and dry needling.   Rationale for Evaluation and Treatment: Rehabilitation  THERAPY DIAG:  No diagnosis found.  ONSET DATE: ***  SUBJECTIVE:                                                                                                                                                                                           SUBJECTIVE STATEMENT: *** LBP for years  Pain worsened > 1 month now  Occassionally radiates BIL thighs   PERTINENT HISTORY:  ***  PAIN:  Are you having pain?  Yes: NPRS scale: *** Pain location: *** Pain description: *** Aggravating factors: *** Relieving factors: ***  PRECAUTIONS: {Therapy precautions:24002}  RED  FLAGS: {PT Red Flags:29287}   WEIGHT BEARING RESTRICTIONS: {Yes ***/No:24003}  FALLS:  Has patient fallen in last 6 months? {fallsyesno:27318}  LIVING ENVIRONMENT: Lives with: {[facility] lives with:25569::lives with their family} Lives in: {Lives in:25570} Stairs: {opstairs:27293} Has following equipment at home: {Assistive devices:23999}  OCCUPATION: ***  PLOF: {PLOF:24004}  PATIENT GOALS: ***  NEXT MD VISIT: ***  OBJECTIVE:  Note: Objective measures were completed at Evaluation unless otherwise noted.  DIAGNOSTIC FINDINGS:  Per referring providers office note:   I obtained lumbar radiographs in the office today that show normal anatomical alignment, no spondylolisthesis, well preserved disc spacing, no fractures.   PATIENT SURVEYS:  Modified Oswestry:  MODIFIED OSWESTRY DISABILITY SCALE  Date: 02/25/2024  Score  Pain intensity {ODI 1:32962}  2. Personal care (washing, dressing, etc.) {ODI 2:32963}  3.  Lifting {ODI 3:32964}  4. Walking {ODI 4:32965}  5. Sitting {ODI 5:32966}  6. Standing {ODI 6:32967}  7. Sleeping {ODI 7:32968}  8. Social Life {ODI 8:32969}  9. Traveling {ODI 9:32970}  10. Employment/ Homemaking {ODI 10:32971}  Total ***/50   Interpretation of scores: Score Category Description  0-20% Minimal Disability The patient can cope with most living activities. Usually no treatment is indicated apart from advice on lifting, sitting and exercise  21-40% Moderate Disability The patient experiences more pain and difficulty with sitting, lifting and standing. Travel and social life are more difficult and they may be disabled from work. Personal care, sexual activity and sleeping are not grossly affected, and the patient can usually be managed by conservative means  41-60% Severe Disability Pain remains the main problem in this group, but activities of daily living are affected. These patients require a detailed investigation  61-80% Crippled Back pain impinges  on all aspects of the patients life. Positive intervention is required  81-100% Bed-bound These patients are either bed-bound or exaggerating their symptoms  Bluford FORBES Zoe DELENA Karon DELENA, et al. Surgery versus conservative management of stable thoracolumbar fracture: the PRESTO feasibility RCT. [place] ([place]): [employer]; 2021 Nov. [facility] Technology Assessment, No. 25.62.) Appendix 3, Oswestry Disability Index category descriptors. Available from: [email or web address].cz  Minimally Clinically Important Difference (MCID) = 12.8%  COGNITION: Overall cognitive status: {cognition:24006}     SENSATION: {sensation:27233}  MUSCLE LENGTH: Hamstrings: Right *** deg; Left *** deg Debby test: Right *** deg; Left *** deg  POSTURE: {posture:25561}  PALPATION: ***  LUMBAR ROM:   AROM eval  Flexion   Extension   Right lateral flexion   Left lateral flexion   Right rotation   Left rotation    (Blank rows = not tested)  LOWER EXTREMITY ROM:     {AROM/PROM:27142}  Right eval Left eval  Hip flexion    Hip extension    Hip abduction    Hip adduction    Hip internal rotation    Hip external rotation    Knee flexion    Knee extension    Ankle dorsiflexion    Ankle plantarflexion    Ankle inversion    Ankle eversion     (Blank rows = not tested)  LOWER EXTREMITY MMT:    MMT Right eval Left eval  Hip flexion    Hip extension    Hip abduction    Hip adduction    Hip internal rotation    Hip external rotation    Knee flexion    Knee extension    Ankle dorsiflexion    Ankle plantarflexion    Ankle inversion    Ankle eversion     (Blank rows = not tested)  LUMBAR SPECIAL TESTS:  {lumbar special test:25242}  FUNCTIONAL TESTS:  {Functional tests:24029}  GAIT: Distance walked: *** Assistive device utilized: {Assistive devices:23999} Level of assistance: {Levels of assistance:24026} Comments: ***  TREATMENT DATE: ***  PATIENT EDUCATION:  Education details: *** Person educated: {Person educated:25204} Education method: {Education Method:25205} Education comprehension: {Education Comprehension:25206}  HOME EXERCISE PROGRAM: ***  ASSESSMENT:  CLINICAL IMPRESSION: Patient is a *** y.o. *** who was seen today for physical therapy evaluation and treatment for ***.   OBJECTIVE IMPAIRMENTS: {opptimpairments:25111}.   ACTIVITY LIMITATIONS: {activitylimitations:27494}  PARTICIPATION LIMITATIONS: {participationrestrictions:25113}  PERSONAL FACTORS: {Personal factors:25162} are also affecting patient's functional outcome.   REHAB POTENTIAL: {rehabpotential:25112}  CLINICAL DECISION MAKING: {clinical decision making:25114}  EVALUATION COMPLEXITY: {Evaluation complexity:25115}   GOALS: Goals reviewed with patient? {yes/no:20286}  SHORT TERM GOALS: Target date: ***  *** Baseline: Goal status: INITIAL  2.  *** Baseline:  Goal status: INITIAL  3.  *** Baseline:  Goal status: INITIAL  4.  *** Baseline:  Goal status: INITIAL  5.  *** Baseline:  Goal status: INITIAL  6.  *** Baseline:  Goal status: INITIAL  LONG TERM GOALS: Target date: ***  *** Baseline:  Goal status: INITIAL  2.  *** Baseline:  Goal status: INITIAL  3.  *** Baseline:  Goal status: INITIAL  4.  *** Baseline:  Goal status: INITIAL  5.  *** Baseline:  Goal status: INITIAL  6.  *** Baseline:  Goal status: INITIAL  PLAN:  PT FREQUENCY: {rehab frequency:25116}  PT DURATION: {rehab duration:25117}  PLANNED INTERVENTIONS: {rehab planned interventions:25118::97110-Therapeutic exercises,97530- Therapeutic [phone]- Neuromuscular re-education,97535- Self Rjmz,02859- Manual therapy,Patient/Family education}.  PLAN FOR NEXT SESSION: *** Referring provider requesting  some manual therapy and possible TPDN   Marko Molt, PT 02/25/2024, 6:20 PM  "

## 2024-02-25 NOTE — Telephone Encounter (Signed)
 Refill not available until 03/20/2024

## 2024-02-26 ENCOUNTER — Ambulatory Visit: Attending: Physical Medicine and Rehabilitation

## 2024-02-26 ENCOUNTER — Other Ambulatory Visit: Payer: Self-pay

## 2024-02-26 DIAGNOSIS — M5416 Radiculopathy, lumbar region: Secondary | ICD-10-CM | POA: Diagnosis present

## 2024-02-26 DIAGNOSIS — M7918 Myalgia, other site: Secondary | ICD-10-CM | POA: Insufficient documentation

## 2024-02-26 DIAGNOSIS — G8929 Other chronic pain: Secondary | ICD-10-CM | POA: Diagnosis not present

## 2024-02-26 DIAGNOSIS — M545 Low back pain, unspecified: Secondary | ICD-10-CM | POA: Diagnosis not present

## 2024-02-26 DIAGNOSIS — M47819 Spondylosis without myelopathy or radiculopathy, site unspecified: Secondary | ICD-10-CM | POA: Insufficient documentation

## 2024-02-28 ENCOUNTER — Other Ambulatory Visit (INDEPENDENT_AMBULATORY_CARE_PROVIDER_SITE_OTHER): Payer: Self-pay

## 2024-02-28 DIAGNOSIS — E1140 Type 2 diabetes mellitus with diabetic neuropathy, unspecified: Secondary | ICD-10-CM

## 2024-02-28 DIAGNOSIS — I10 Essential (primary) hypertension: Secondary | ICD-10-CM

## 2024-02-28 DIAGNOSIS — E782 Mixed hyperlipidemia: Secondary | ICD-10-CM

## 2024-02-28 DIAGNOSIS — Z760 Encounter for issue of repeat prescription: Secondary | ICD-10-CM

## 2024-02-28 MED ORDER — ATORVASTATIN CALCIUM 40 MG PO TABS: 40.0000 mg | ORAL_TABLET | Freq: Every day | ORAL | 1 refills | Status: AC

## 2024-02-28 MED ORDER — METFORMIN HCL 500 MG PO TABS: 500.0000 mg | ORAL_TABLET | Freq: Two times a day (BID) | ORAL | 1 refills | Status: AC

## 2024-02-28 MED ORDER — VALSARTAN-HYDROCHLOROTHIAZIDE 160-25 MG PO TABS: 1.0000 | ORAL_TABLET | Freq: Every day | ORAL | 0 refills | Status: AC

## 2024-02-28 MED ORDER — AMLODIPINE BESYLATE 10 MG PO TABS: 10.0000 mg | ORAL_TABLET | Freq: Every day | ORAL | 1 refills | Status: AC

## 2024-02-28 MED ORDER — CARVEDILOL 12.5 MG PO TABS: 12.5000 mg | ORAL_TABLET | Freq: Two times a day (BID) | ORAL | 1 refills | Status: AC

## 2024-02-28 MED ORDER — ALBUTEROL SULFATE HFA 108 (90 BASE) MCG/ACT IN AERS: 2.0000 | INHALATION_SPRAY | Freq: Four times a day (QID) | RESPIRATORY_TRACT | 2 refills | Status: AC | PRN

## 2024-02-28 MED ORDER — GABAPENTIN 300 MG PO CAPS: 300.0000 mg | ORAL_CAPSULE | Freq: Two times a day (BID) | ORAL | 1 refills | Status: AC

## 2024-03-04 ENCOUNTER — Ambulatory Visit

## 2024-03-04 DIAGNOSIS — M5416 Radiculopathy, lumbar region: Secondary | ICD-10-CM

## 2024-03-04 NOTE — Therapy (Signed)
 " OUTPATIENT PHYSICAL THERAPY TREATMENT   Patient Name: Stephanie [name] MRN: [id number] DOB:August 03, 1970, 54 y.o., female Today's Date: 03/04/2024  END OF SESSION:  PT End of Session - 03/04/24 1214     Visit Number 2    Number of Visits 16    Date for Recertification  04/22/24    Authorization Type [employer]    PT Start Time 1215    PT Stop Time 1253    PT Time Calculation (min) 38 min    Activity Tolerance Patient tolerated treatment well    Behavior During Therapy WFL for tasks assessed/performed           Past Medical History:  Diagnosis Date   Diabetes mellitus without complication (HCC)    High cholesterol    Hypertension    No past surgical history on file. Patient Active Problem List   Diagnosis Date Noted   Acute CVA (cerebrovascular accident) (HCC) 07/11/2021   Polysubstance abuse (HCC) 07/11/2021   High cholesterol 11/21/2020   Mixed hyperlipidemia 01/05/2020   Elevated liver enzymes 01/05/2020   Asymptomatic hypertensive urgency 01/05/2020   Diabetes mellitus (HCC) 01/05/2020   Type 2 diabetes mellitus with diabetic neuropathy (HCC) 08/14/2019   Abnormal EKG 04/13/2018   Elevated blood sugar 12/31/2017   Essential hypertension 12/31/2017    PCP: Celestia Rosaline SQUIBB, NP  REFERRING PROVIDER: Trudy Duwaine BRAVO, NP   REFERRING DIAG:  M54.50,G89.29 (ICD-10-CM) - Chronic bilateral low back pain without sciatica M47.819 (ICD-10-CM) - Spondylosis without myelopathy or radiculopathy M79.18 (ICD-10-CM) - Myofascial pain syndrome Eval and Treat: Chronic lower back pain, myofascial pain syndrome. Consider manual treatments, core strengthening and dry needling.   Rationale for Evaluation and Treatment: Rehabilitation  THERAPY DIAG:  Radiculopathy, lumbar region  ONSET DATE: 1 year   SUBJECTIVE:                                                                                                                                                                                            SUBJECTIVE STATEMENT: 03/04/2024 Pt reports 7/10 pain. She has yet to perform her HEP.   EVAL Patient reports that her lower back that has been present for at least 1 year. She states that she occasionally radiating pain to BIL thighs.   PERTINENT HISTORY:  Relevant PMHx includes DM with diabetic neuropathy, HTN, Acute CVA, polysubstance abuse  PAIN:  Are you having pain?  Yes: NPRS scale: 5/10 current, 10/10 worst  Pain location: BIL low back pain  Pain description: throbbing, aching, shooting  Aggravating factors: sitting too long, laying in the bed Relieving factors: moving around, tylenol    PRECAUTIONS:  None  RED FLAGS: None   WEIGHT BEARING RESTRICTIONS: No  FALLS:  Has patient fallen in last 6 months? No  LIVING ENVIRONMENT: Lives with: friend Lives in: House/apartment Stairs: No Has following equipment at home: None  OCCUPATION: n/a  PLOF: Independent  PATIENT GOALS: To be able to sit and lay down too long.   NEXT MD VISIT: 04/15/24 with orthopedics   OBJECTIVE:  Note: Objective measures were completed at Evaluation unless otherwise noted.  DIAGNOSTIC FINDINGS:  Per referring providers office note:   I obtained lumbar radiographs in the office today that show normal anatomical alignment, no spondylolisthesis, well preserved disc spacing, no fractures.   PATIENT SURVEYS:   Modified Oswestry:  MODIFIED OSWESTRY DISABILITY SCALE  Date: 02/26/2024  Score  Pain intensity 3 =  Pain medication provides me with moderate relief from pain.  2. Personal care (washing, dressing, etc.) 1 =  I can take care of myself normally, but it increases my pain.  3. Lifting 3 = Pain prevents me from lifting heavy weights, but I can manage light to medium weights if they are conveniently positioned  4. Walking 1 = Pain prevents me from walking more than 1 mile.  5. Sitting 2 =  Pain prevents me from sitting more than 1 hour.  6. Standing 1 =  I  can stand as long as I want but, it increases my pain.  7. Sleeping 1 = I can sleep well only by using pain medication.  8. Social Life 0 = My social life is normal and does not increase my pain.  9. Traveling 1 =  I can travel anywhere, but it increases my pain.  10. Employment/ Homemaking 2 = I can perform most of my homemaking/job duties, but pain prevents me from performing more physically stressful activities (eg, lifting, vacuuming).  Total 15/50   Interpretation of scores: Score Category Description  0-20% Minimal Disability The patient can cope with most living activities. Usually no treatment is indicated apart from advice on lifting, sitting and exercise  21-40% Moderate Disability The patient experiences more pain and difficulty with sitting, lifting and standing. Travel and social life are more difficult and they may be disabled from work. Personal care, sexual activity and sleeping are not grossly affected, and the patient can usually be managed by conservative means  41-60% Severe Disability Pain remains the main problem in this group, but activities of daily living are affected. These patients require a detailed investigation  61-80% Crippled Back pain impinges on all aspects of the patients life. Positive intervention is required  81-100% Bed-bound These patients are either bed-bound or exaggerating their symptoms  Bluford FORBES Zoe DELENA Karon DELENA, et al. Surgery versus conservative management of stable thoracolumbar fracture: the PRESTO feasibility RCT. [place] ([place]): [employer]; 2021 Nov. [facility] Technology Assessment, No. 25.62.) Appendix 3, Oswestry Disability Index category descriptors. Available from: Findjewelers.cz  Minimally Clinically Important Difference (MCID) = 12.8%  COGNITION: Overall cognitive status: Within functional limits for tasks assessed     SENSATION: Not tested  MUSCLE LENGTH: Hamstrings: Right 95 deg; Left  80 deg passive SLR  PALPATION: Significant sensitivity to palpation along TS and LS. Unable to properly assess spinal mobility d/t excessive mm guarding with light-to-medium pressure.   LUMBAR ROM:   AROM eval  Flexion 80%  Extension 50%  Right lateral flexion 100%  Left lateral flexion 100%  Right rotation 75%  Left rotation 75%   (Blank rows = not tested)  LOWER EXTREMITY MMT:    MMT Right eval Left eval  Hip flexion    Hip extension    Hip abduction    Hip adduction    Hip internal rotation    Hip external rotation    Knee flexion    Knee extension    Ankle dorsiflexion    Ankle plantarflexion    Ankle inversion    Ankle eversion     (Blank rows = not tested)  LUMBAR SPECIAL TESTS:  Straight leg raise test: Negative  FABER test: Negative  Femoral nerve test: deferred   02/26/2024 prone knee flexion increased anterior thigh pain//mm tension at <90 degrees   Double Limb Lowering Test for core/abdominal mm strength assessment    02/26/2024: 60deg  Angle Rating 90 very poor, starting position 75 poor 60 below average 45 average 30 above average 15 good 0 excellent, legs horizontal   FUNCTIONAL TESTS:  deferred  GAIT: Distance walked: within clinic  Assistive device utilized: None Level of assistance: Complete Independence Comments: antalgic gait with mild forward flexed posture, decrease knee flexion in swing phase BIL   TREATMENT DATE:  [facility] Adult PT Treatment:                                                DATE: 03/04/2024   Neuro Re-Ed:  Gluteal set with legs on ball 10x5 hold Bridge with legs on ball x 10  Supine core brace 10x5 hold Seated flexion stretch 3x20 hold  Seated side bending 5x10 hold B Seated HS stretch 3x20 hold B  STS without UE support 2 x 10   Therapeutic Exercise:  NuStep level 3 x 6 mins  DKTC with ball x 15 LTR with ball x 10 B    [facility] Adult PT Treatment:                                                DATE:  02/26/2024   Initial evaluation: see patient education and home exercise program as noted below                                                                                                                                   PATIENT EDUCATION:  Education details: reviewed initial home exercise program; discussion of POC, prognosis and goals for skilled PT   Person educated: Patient Education method: Explanation, Demonstration, and Handouts Education comprehension: verbalized understanding, returned demonstration, and needs further education  HOME EXERCISE PROGRAM: Access Code: [email or web address] URL: [email or web address] Date: 02/26/2024 Prepared by: Marko Molt  Exercises - Supine Lower Trunk Rotation  - 1 x daily - 7 x weekly - 3 sets - 5  reps - 3 sec hold - Seated Flexion Stretch  - 1 x daily - 7 x weekly - 3 sets - 5 reps - 3 sec hold - Seated Sidebending  - 1 x daily - 7 x weekly - 2 sets - 10 reps - Seated Hamstring Stretch  - 1 x daily - 7 x weekly - 2 sets - 3 reps - 10-20 sec hold  ASSESSMENT:  CLINICAL IMPRESSION: 03/04/2024 HEP reviewed today. Pt advised to perform HEP daily to assist with pain. She verbalized understanding. Pt introduced to mobility and strengthening exercises for the core and hips. She tolerated all exercises well.  The pt will benefit from skilled physical therapy to decrease pain and increase function.    EVAL Anquinette is a 54 y.o. female who was seen today for physical therapy evaluation and treatment for Low back pain with radicular pain to BIL anterior thighs. She is demonstrating decreased LS AROM, decreased BIL hamstring mm tension, decreased abdominal strength, and excessive mm guarding along TS and LS paraspinals BIL. She has related pain and difficulty with lifting, prolonged sitting, laying down, and decreased sleep quality. She requires skilled PT services at this time to address relevant deficits and improve overall function.      OBJECTIVE IMPAIRMENTS: decreased activity tolerance, decreased ROM, decreased strength, improper body mechanics, and pain.   ACTIVITY LIMITATIONS: carrying, lifting, bending, sitting, and sleeping  PARTICIPATION LIMITATIONS: meal prep, cleaning, interpersonal relationship, and community activity  PERSONAL FACTORS: Time since onset of injury/illness/exacerbation and 3+ comorbidities: Relevant PMHx includes DM with diabetic neuropathy, HTN, Acute CVA, polysubstance abuse are also affecting patient's functional outcome.   REHAB POTENTIAL: Fair    CLINICAL DECISION MAKING: Evolving/moderate complexity  EVALUATION COMPLEXITY: Moderate   GOALS: Goals reviewed with patient? YES  SHORT TERM GOALS: Target date: 03/25/2024   Patient will be independent with initial home program at least 3 days/week.  Baseline: provided at eval Goal Status: INITIAL   2.  Patient will demonstrate improved postural awareness for at least 15 minutes while seated without need for cueing from PT.  Baseline: see objective measures Goal Status: INITIAL   3.  Patient will demonstrate improved LS extension ROM to at least 75% pain free.  Baseline: see objective measures Goal Status: INITIAL     LONG TERM GOALS: Target date: 04/22/2024   Patient will report improved overall functional ability with modified ODI score of 5/50 or less.  Baseline: 15/50 Goal Status: INITIAL    2.  Patient will report ability to tolerate sitting/driving for at least 1 hour, with no more than 3/10 low back pain.  Baseline: 5-10/10 Goal status: INITIAL  3.  Patient will report ability to sleep at least 6 hours/night with minimal symptoms during the night and upon waking.  Baseline: unable to tolerate without pain medication  Goal status: INITIAL  4. Patient will identify at least 2 exercises for centralization or improvement of radicular symptoms.  Baseline: initial education provided at eval  Goal status:  INITIAL   PLAN:  PT FREQUENCY: 1-2x/week  PT DURATION: 8 weeks  PLANNED INTERVENTIONS: 97164- PT Re-evaluation, 97750- Physical Performance Testing, 97110-Therapeutic exercises, 97530- Therapeutic activity, [id number]- Neuromuscular re-education, 97535- Self Care, 02859- Manual therapy, [id number]- Aquatic Therapy, [id number]- Electrical stimulation (unattended), [phone]- Electrical stimulation (manual), [id number]- Traction (mechanical), 20560 (1-2 muscles), 20561 (3+ muscles)- Dry Needling, Patient/Family education, Taping, Spinal manipulation, Spinal mobilization, Cryotherapy, and Moist heat.  For all possible CPT codes, reference the Planned Interventions line above.  Check all conditions that are expected to impact treatment: {Conditions expected to impact treatment:Diabetes mellitus, Neurological condition and/or seizures, and Social determinants of health    PLAN FOR NEXT SESSION: review and update HEP as indicated; initial focus on thoracolumbar ROM activities, repeated motions and sustained lumbar extension if tolerated well; other interventions for pain modulation as appropriate; consider manual therapy and TPDN; patient education     Marijo Berber PT, DPT 03/04/2024 1:32 PM   "

## 2024-03-11 ENCOUNTER — Ambulatory Visit

## 2024-03-12 ENCOUNTER — Ambulatory Visit

## 2024-03-13 ENCOUNTER — Ambulatory Visit

## 2024-03-13 ENCOUNTER — Telehealth: Payer: Self-pay

## 2024-03-13 NOTE — Therapy (Incomplete)
 " OUTPATIENT PHYSICAL THERAPY TREATMENT   Patient Name: Stephanie [name] MRN: [id number] DOB:Jul 27, 1970, 54 y.o., female Today's Date: 03/13/2024  END OF SESSION:     Past Medical History:  Diagnosis Date   Diabetes mellitus without complication (HCC)    High cholesterol    Hypertension    No past surgical history on file. Patient Active Problem List   Diagnosis Date Noted   Acute CVA (cerebrovascular accident) (HCC) 07/11/2021   Polysubstance abuse (HCC) 07/11/2021   High cholesterol 11/21/2020   Mixed hyperlipidemia 01/05/2020   Elevated liver enzymes 01/05/2020   Asymptomatic hypertensive urgency 01/05/2020   Diabetes mellitus (HCC) 01/05/2020   Type 2 diabetes mellitus with diabetic neuropathy (HCC) 08/14/2019   Abnormal EKG 04/13/2018   Elevated blood sugar 12/31/2017   Essential hypertension 12/31/2017    PCP: Celestia Rosaline SQUIBB, NP  REFERRING PROVIDER: Trudy Duwaine BRAVO, NP   REFERRING DIAG:  M54.50,G89.29 (ICD-10-CM) - Chronic bilateral low back pain without sciatica M47.819 (ICD-10-CM) - Spondylosis without myelopathy or radiculopathy M79.18 (ICD-10-CM) - Myofascial pain syndrome Eval and Treat: Chronic lower back pain, myofascial pain syndrome. Consider manual treatments, core strengthening and dry needling.   Rationale for Evaluation and Treatment: Rehabilitation  THERAPY DIAG:  No diagnosis found.  ONSET DATE: 1 year   SUBJECTIVE:                                                                                                                                                                                           SUBJECTIVE STATEMENT: 03/13/2024 ***  Pt reports 7/10 pain. She has yet to perform her HEP.   EVAL Patient reports that her lower back that has been present for at least 1 year. She states that she occasionally radiating pain to BIL thighs.   PERTINENT HISTORY:  Relevant PMHx includes DM with diabetic neuropathy, HTN, Acute CVA,  polysubstance abuse  PAIN:  Are you having pain?  Yes: NPRS scale: 5/10 current, 10/10 worst  Pain location: BIL low back pain  Pain description: throbbing, aching, shooting  Aggravating factors: sitting too long, laying in the bed Relieving factors: moving around, tylenol    PRECAUTIONS: None  RED FLAGS: None   WEIGHT BEARING RESTRICTIONS: No  FALLS:  Has patient fallen in last 6 months? No  LIVING ENVIRONMENT: Lives with: friend Lives in: House/apartment Stairs: No Has following equipment at home: None  OCCUPATION: n/a  PLOF: Independent  PATIENT GOALS: To be able to sit and lay down too long.   NEXT MD VISIT: 04/15/24 with orthopedics   OBJECTIVE:  Note: Objective measures were completed at Evaluation unless otherwise noted.  DIAGNOSTIC FINDINGS:  Per referring providers office note:   I obtained lumbar radiographs in the office today that show normal anatomical alignment, no spondylolisthesis, well preserved disc spacing, no fractures.   PATIENT SURVEYS:   Modified Oswestry:  MODIFIED OSWESTRY DISABILITY SCALE  Date: 02/26/2024  Score  Pain intensity 3 =  Pain medication provides me with moderate relief from pain.  2. Personal care (washing, dressing, etc.) 1 =  I can take care of myself normally, but it increases my pain.  3. Lifting 3 = Pain prevents me from lifting heavy weights, but I can manage light to medium weights if they are conveniently positioned  4. Walking 1 = Pain prevents me from walking more than 1 mile.  5. Sitting 2 =  Pain prevents me from sitting more than 1 hour.  6. Standing 1 =  I can stand as long as I want but, it increases my pain.  7. Sleeping 1 = I can sleep well only by using pain medication.  8. Social Life 0 = My social life is normal and does not increase my pain.  9. Traveling 1 =  I can travel anywhere, but it increases my pain.  10. Employment/ Homemaking 2 = I can perform most of my homemaking/job duties, but pain  prevents me from performing more physically stressful activities (eg, lifting, vacuuming).  Total 15/50   Interpretation of scores: Score Category Description  0-20% Minimal Disability The patient can cope with most living activities. Usually no treatment is indicated apart from advice on lifting, sitting and exercise  21-40% Moderate Disability The patient experiences more pain and difficulty with sitting, lifting and standing. Travel and social life are more difficult and they may be disabled from work. Personal care, sexual activity and sleeping are not grossly affected, and the patient can usually be managed by conservative means  41-60% Severe Disability Pain remains the main problem in this group, but activities of daily living are affected. These patients require a detailed investigation  61-80% Crippled Back pain impinges on all aspects of the patients life. Positive intervention is required  81-100% Bed-bound These patients are either bed-bound or exaggerating their symptoms  Bluford FORBES Zoe DELENA Karon DELENA, et al. Surgery versus conservative management of stable thoracolumbar fracture: the PRESTO feasibility RCT. [place] ([place]): [employer]; 2021 Nov. [facility] Technology Assessment, No. 25.62.) Appendix 3, Oswestry Disability Index category descriptors. Available from: [email or web address].cz  Minimally Clinically Important Difference (MCID) = 12.8%  COGNITION: Overall cognitive status: Within functional limits for tasks assessed     SENSATION: Not tested  MUSCLE LENGTH: Hamstrings: Right 95 deg; Left 80 deg passive SLR  PALPATION: Significant sensitivity to palpation along TS and LS. Unable to properly assess spinal mobility d/t excessive mm guarding with light-to-medium pressure.   LUMBAR ROM:   AROM eval  Flexion 80%  Extension 50%  Right lateral flexion 100%  Left lateral flexion 100%  Right rotation 75%  Left rotation 75%   (Blank  rows = not tested)   LOWER EXTREMITY MMT:    MMT Right eval Left eval  Hip flexion    Hip extension    Hip abduction    Hip adduction    Hip internal rotation    Hip external rotation    Knee flexion    Knee extension    Ankle dorsiflexion    Ankle plantarflexion    Ankle inversion    Ankle eversion     (Blank rows = not tested)  LUMBAR SPECIAL TESTS:  Straight leg raise test: Negative  FABER test: Negative  Femoral nerve test: deferred   02/26/2024 prone knee flexion increased anterior thigh pain//mm tension at <90 degrees   Double Limb Lowering Test for core/abdominal mm strength assessment    02/26/2024: 60deg  Angle Rating 90 very poor, starting position 75 poor 60 below average 45 average 30 above average 15 good 0 excellent, legs horizontal   FUNCTIONAL TESTS:  deferred  GAIT: Distance walked: within clinic  Assistive device utilized: None Level of assistance: Complete Independence Comments: antalgic gait with mild forward flexed posture, decrease knee flexion in swing phase BIL   TREATMENT DATE:  [facility] Adult PT Treatment:                                                DATE: 03/13/24 Therapeutic Exercise: NuStep level 3 x 6 mins  DKTC with ball x 15 LTR with ball x 10 B  Neuromuscular re-ed: Gluteal set with legs on ball 10x5 hold Bridge with legs on ball x 10  Supine core brace 10x5 hold Seated flexion stretch 3x20 hold  Seated side bending 5x10 hold B Seated HS stretch 3x20 hold B  STS without UE support 2 x 10    [facility] Adult PT Treatment:                                                DATE: 03/04/24 Neuro Re-Ed:  Gluteal set with legs on ball 10x5 hold Bridge with legs on ball x 10  Supine core brace 10x5 hold Seated flexion stretch 3x20 hold  Seated side bending 5x10 hold B Seated HS stretch 3x20 hold B  STS without UE support 2 x 10   Therapeutic Exercise:  NuStep level 3 x 6 mins  DKTC with ball x 15 LTR with ball x 10 B       PATIENT EDUCATION:  Education details: reviewed initial home exercise program; discussion of POC, prognosis and goals for skilled PT   Person educated: Patient Education method: Explanation, Demonstration, and Handouts Education comprehension: verbalized understanding, returned demonstration, and needs further education  HOME EXERCISE PROGRAM: Access Code: [email or web address] URL: [email or web address] Date: 02/26/2024 Prepared by: Marko Molt  Exercises - Supine Lower Trunk Rotation  - 1 x daily - 7 x weekly - 3 sets - 5 reps - 3 sec hold - Seated Flexion Stretch  - 1 x daily - 7 x weekly - 3 sets - 5 reps - 3 sec hold - Seated Sidebending  - 1 x daily - 7 x weekly - 2 sets - 10 reps - Seated Hamstring Stretch  - 1 x daily - 7 x weekly - 2 sets - 3 reps - 10-20 sec hold  ASSESSMENT:  CLINICAL IMPRESSION: 03/13/2024 ***  HEP reviewed today. Pt advised to perform HEP daily to assist with pain. She verbalized understanding. Pt introduced to mobility and strengthening exercises for the core and hips. She tolerated all exercises well.  The pt will benefit from skilled physical therapy to decrease pain and increase function.    EVAL Maison is a 54 y.o. female who was seen today for physical therapy evaluation and treatment for Low back pain with radicular pain to BIL anterior thighs.  She is demonstrating decreased LS AROM, decreased BIL hamstring mm tension, decreased abdominal strength, and excessive mm guarding along TS and LS paraspinals BIL. She has related pain and difficulty with lifting, prolonged sitting, laying down, and decreased sleep quality. She requires skilled PT services at this time to address relevant deficits and improve overall function.     OBJECTIVE IMPAIRMENTS: decreased activity tolerance, decreased ROM, decreased strength, improper body mechanics, and pain.   ACTIVITY LIMITATIONS: carrying, lifting, bending, sitting, and sleeping  PARTICIPATION  LIMITATIONS: meal prep, cleaning, interpersonal relationship, and community activity  PERSONAL FACTORS: Time since onset of injury/illness/exacerbation and 3+ comorbidities: Relevant PMHx includes DM with diabetic neuropathy, HTN, Acute CVA, polysubstance abuse are also affecting patient's functional outcome.   REHAB POTENTIAL: Fair    CLINICAL DECISION MAKING: Evolving/moderate complexity  EVALUATION COMPLEXITY: Moderate   GOALS: Goals reviewed with patient? YES  SHORT TERM GOALS: Target date: 03/25/2024   Patient will be independent with initial home program at least 3 days/week.  Baseline: provided at eval Goal Status: INITIAL   2.  Patient will demonstrate improved postural awareness for at least 15 minutes while seated without need for cueing from PT.  Baseline: see objective measures Goal Status: INITIAL   3.  Patient will demonstrate improved LS extension ROM to at least 75% pain free.  Baseline: see objective measures Goal Status: INITIAL     LONG TERM GOALS: Target date: 04/22/2024   Patient will report improved overall functional ability with modified ODI score of 5/50 or less.  Baseline: 15/50 Goal Status: INITIAL    2.  Patient will report ability to tolerate sitting/driving for at least 1 hour, with no more than 3/10 low back pain.  Baseline: 5-10/10 Goal status: INITIAL  3.  Patient will report ability to sleep at least 6 hours/night with minimal symptoms during the night and upon waking.  Baseline: unable to tolerate without pain medication  Goal status: INITIAL  4. Patient will identify at least 2 exercises for centralization or improvement of radicular symptoms.  Baseline: initial education provided at eval  Goal status: INITIAL   PLAN:  PT FREQUENCY: 1-2x/week  PT DURATION: 8 weeks  PLANNED INTERVENTIONS: 97164- PT Re-evaluation, 97750- Physical Performance Testing, 97110-Therapeutic exercises, 97530- Therapeutic activity, [id number]- Neuromuscular  re-education, 97535- Self Care, [id number]- Manual therapy, [id number]- Aquatic Therapy, H9716- Electrical stimulation (unattended), [phone]- Electrical stimulation (manual), [id number]- Traction (mechanical), 20560 (1-2 muscles), 20561 (3+ muscles)- Dry Needling, Patient/Family education, Taping, Spinal manipulation, Spinal mobilization, Cryotherapy, and Moist heat.  For all possible CPT codes, reference the Planned Interventions line above.     Check all conditions that are expected to impact treatment: {Conditions expected to impact treatment:Diabetes mellitus, Neurological condition and/or seizures, and Social determinants of health    PLAN FOR NEXT SESSION: review and update HEP as indicated; initial focus on thoracolumbar ROM activities, repeated motions and sustained lumbar extension if tolerated well; other interventions for pain modulation as appropriate; consider manual therapy and TPDN; patient education     Corean Pouch PTA  03/13/2024 8:01 AM   "

## 2024-03-13 NOTE — Telephone Encounter (Signed)
 Spoke to patient regarding missed appointment and confirmed next appointment time.   1st no-show  Stephanie [name], [name] 03/13/24 9:54 AM

## 2024-03-17 ENCOUNTER — Ambulatory Visit

## 2024-03-18 ENCOUNTER — Ambulatory Visit

## 2024-03-20 ENCOUNTER — Ambulatory Visit

## 2024-03-25 ENCOUNTER — Ambulatory Visit

## 2024-03-27 ENCOUNTER — Ambulatory Visit

## 2024-03-27 ENCOUNTER — Ambulatory Visit (INDEPENDENT_AMBULATORY_CARE_PROVIDER_SITE_OTHER): Payer: Self-pay | Admitting: Primary Care

## 2024-04-01 ENCOUNTER — Ambulatory Visit

## 2024-04-15 ENCOUNTER — Ambulatory Visit
